# Patient Record
Sex: Female | Born: 1935 | Race: Black or African American | Hispanic: No | Smoking: Never smoker
Health system: Southern US, Community
[De-identification: ages and names within clinical notes are randomized; demographics above are authoritative.]

## PROBLEM LIST (undated history)

## (undated) DIAGNOSIS — I10 Essential (primary) hypertension: Secondary | ICD-10-CM

## (undated) DIAGNOSIS — I639 Cerebral infarction, unspecified: Secondary | ICD-10-CM

## (undated) DIAGNOSIS — R569 Unspecified convulsions: Secondary | ICD-10-CM

## (undated) DIAGNOSIS — F329 Major depressive disorder, single episode, unspecified: Secondary | ICD-10-CM

## (undated) DIAGNOSIS — F32A Depression, unspecified: Secondary | ICD-10-CM

## (undated) DIAGNOSIS — E785 Hyperlipidemia, unspecified: Secondary | ICD-10-CM

## (undated) DIAGNOSIS — E039 Hypothyroidism, unspecified: Secondary | ICD-10-CM

## (undated) DIAGNOSIS — F419 Anxiety disorder, unspecified: Secondary | ICD-10-CM

## (undated) DIAGNOSIS — K219 Gastro-esophageal reflux disease without esophagitis: Secondary | ICD-10-CM

## (undated) HISTORY — DX: Depression, unspecified: F32.A

## (undated) HISTORY — PX: NO PAST SURGERIES: SHX2092

## (undated) HISTORY — DX: Major depressive disorder, single episode, unspecified: F32.9

## (undated) HISTORY — DX: Gastro-esophageal reflux disease without esophagitis: K21.9

## (undated) HISTORY — DX: Unspecified convulsions: R56.9

## (undated) HISTORY — DX: Anxiety disorder, unspecified: F41.9

## (undated) HISTORY — DX: Hyperlipidemia, unspecified: E78.5

---

## 2010-05-13 DIAGNOSIS — I672 Cerebral atherosclerosis: Secondary | ICD-10-CM | POA: Insufficient documentation

## 2010-05-20 ENCOUNTER — Inpatient Hospital Stay (HOSPITAL_COMMUNITY)
Admission: EM | Admit: 2010-05-20 | Discharge: 2010-05-31 | Disposition: A | Discharge status: Home / Self Care | Payer: Medicare Other | Source: Home / Self Care | Attending: Internal Medicine | Admitting: Internal Medicine

## 2010-05-23 LAB — POCT I-STAT, CHEM 8
Chloride: 106 mEq/L (ref 96–112)
HCT: 44 % (ref 36.0–46.0)
Hemoglobin: 15 g/dL (ref 12.0–15.0)
Potassium: 3.2 mEq/L — ABNORMAL LOW (ref 3.5–5.1)

## 2010-05-23 LAB — CBC
Platelets: 130 10*3/uL — ABNORMAL LOW (ref 150–400)
RDW: 14.4 % (ref 11.5–15.5)
WBC: 7.6 10*3/uL (ref 4.0–10.5)

## 2010-05-23 LAB — LIPID PANEL
Cholesterol: 167 mg/dL (ref 0–200)
HDL: 60 mg/dL (ref >39)
LDL Cholesterol: 96 mg/dL (ref 0–99)
Total CHOL/HDL Ratio: 2.8 RATIO

## 2010-05-23 LAB — COMPREHENSIVE METABOLIC PANEL
ALT: 12 U/L (ref 0–35)
Albumin: 3.5 g/dL (ref 3.5–5.2)
CO2: 25 mEq/L (ref 19–32)
Calcium: 8.8 mg/dL (ref 8.4–10.5)
Chloride: 108 mEq/L (ref 96–112)
GFR calc Af Amer: >60 mL/min (ref >60)
Potassium: 3.6 mEq/L (ref 3.5–5.1)
Total Protein: 7.9 g/dL (ref 6.0–8.3)

## 2010-05-23 LAB — DIFFERENTIAL
Basophils Absolute: 0 10*3/uL (ref 0.0–0.1)
Basophils Relative: 0 % (ref 0–1)
Eosinophils Absolute: 0 10*3/uL (ref 0.0–0.7)
Eosinophils Relative: 0 % (ref 0–5)

## 2010-05-23 LAB — HEMOGLOBIN A1C
Hgb A1c MFr Bld: 5.8 % — ABNORMAL HIGH (ref <5.7)
Mean Plasma Glucose: 120 mg/dL — ABNORMAL HIGH (ref <117)

## 2010-05-23 LAB — POCT CARDIAC MARKERS
CKMB, poc: 12.8 ng/mL (ref 1.0–8.0)
Troponin i, poc: <0.05 ng/mL (ref 0.00–0.09)

## 2010-05-24 LAB — CARDIAC PANEL(CRET KIN+CKTOT+MB+TROPI)
CK, MB: 10.8 ng/mL (ref 0.3–4.0)
Total CK: 540 U/L — ABNORMAL HIGH (ref 7–177)
Total CK: 577 U/L — ABNORMAL HIGH (ref 7–177)
Troponin I: 0.04 ng/mL (ref 0.00–0.06)
Troponin I: 0.04 ng/mL (ref 0.00–0.06)

## 2010-05-24 LAB — LIPID PANEL
Total CHOL/HDL Ratio: 2.8 RATIO
VLDL: 11 mg/dL (ref 0–40)

## 2010-05-24 LAB — BASIC METABOLIC PANEL
BUN: 22 mg/dL (ref 6–23)
CO2: 26 mEq/L (ref 19–32)
Chloride: 102 mEq/L (ref 96–112)
Creatinine, Ser: 1.23 mg/dL — ABNORMAL HIGH (ref 0.4–1.2)
Glucose, Bld: 133 mg/dL — ABNORMAL HIGH (ref 70–99)

## 2010-05-24 LAB — GLUCOSE, CAPILLARY

## 2010-05-26 LAB — CBC
HCT: 37.3 % (ref 36.0–46.0)
Hemoglobin: 13.1 g/dL (ref 12.0–15.0)
MCH: 27.2 pg (ref 26.0–34.0)
MCHC: 35.1 g/dL (ref 30.0–36.0)

## 2010-05-26 LAB — BASIC METABOLIC PANEL
CO2: 26 mEq/L (ref 19–32)
Calcium: 8.8 mg/dL (ref 8.4–10.5)
Glucose, Bld: 119 mg/dL — ABNORMAL HIGH (ref 70–99)
Sodium: 141 mEq/L (ref 135–145)

## 2010-05-28 NOTE — Consult Note (Signed)
  NAMETYLIE, GOLONKA                ACCOUNT NO.:  [id number]  MEDICAL RECORD NO.:  [id number]          PATIENT TYPE:  INP  LOCATION:  3107                         FACILITY:  [facility]  PHYSICIAN:  Thana Farr, MD    DATE OF BIRTH:  05-20-1934  DATE OF CONSULTATION:  05/21/2010 DATE OF DISCHARGE:                                CONSULTATION   HISTORY:  Ms. Nelson is a 76 year old female who for approximately 4 days prior to admission had noted some difficulty with speech.  On the day of admission, her family noted that she was confused.  She was brought in for evaluation.  The patient was felt to have an expressive aphasia.  CT shows some small vessel ischemic changes, but no other abnormalities.  MRI of the brain was performed that showed multiple areas of ischemia in the left MCA distribution.  There was also a questionable area of subacute infarction on the right hemisphere as well.  Consult was called for further recommendations.  PAST MEDICAL HISTORY:  Hypertension.  The patient is noncompliant with meds for the past 2 years.  CURRENT MEDICATIONS: 1. Norvasc. 2. Ecotrin. 3. Hydrochlorothiazide. 4. Lovenox.  ALLERGIES:  No known drug allergies.  SOCIAL HISTORY:  The patient has no history of alcohol, tobacco or illicit drug abuse.  PHYSICAL EXAMINATION:  GENERAL:  Blood pressure 108/95, heart rate 83, respiratory rate 20, temperature 97.6, and O2 sat 96% on room air. NEUROLOGIC:  Mental status:  The patient is alert.  She has some difficulty following commands, specifically with more complicated and three-step commands.  She does some much better with one-step commands. Speech is nonfluent, and at times, she has nonsensical verbiage.  On cranial nerve testing II, discs flat bilaterally.  Visual fields grossly intact.  III, IV, and VI, extraocular movements intact.  Pupils reactive bilaterally.  V and VII, smile symmetric.  VIII grossly intact.  IX and X, positive gag.   XI, bilateral shoulder shrug.  XII, midline tongue extension.  On motor exam, the patient has a right upper extremity pronator drift, but otherwise gives 5/5 strength.  On sensory test, pinprick and light touch were intact bilaterally.  Deep tendon reflexes are 3+ throughout, absent ankle jerks.  Plantars are mute bilaterally. On cerebellar testing, finger-to-nose and heel-to-shin intact.  LABORATORY TESTS:  Sodium is 144, potassium 3.6, chloride 108, CO2 of 25, BUN and creatinine 7 and 0.92 respectively.  Glucose was 137. Hemoglobin and hematocrit of 15 and 44.0 respectively.  PT, INR, and PTT of 15, 1.16, and 32 respectively.  ASSESSMENT:  A 76 year old female with risk factor of hypertension and noncompliant with medications, who presents with left MCA distribution acute infarcts.  PLAN: 1. Echocardiogram today. 2. ASA appropriate for long-term prophylaxis unless an embolic source     found on cardiac testing. 3. Compliance stressed. 4. Speech therapy.          ______________________________ Thana Farr, MD     LR/MEDQ  D:  05/21/2010  T:  05/21/2010  Job:  [id number]  Electronically Signed by Thana Farr MD on 05/28/2010 10:14:08 AM

## 2010-05-29 LAB — CBC
Hemoglobin: 13.5 g/dL (ref 12.0–15.0)
MCH: 27.2 pg (ref 26.0–34.0)
MCHC: 34.9 g/dL (ref 30.0–36.0)

## 2010-05-29 LAB — BASIC METABOLIC PANEL
BUN: 50 mg/dL — ABNORMAL HIGH (ref 6–23)
CO2: 23 mEq/L (ref 19–32)
Calcium: 8.9 mg/dL (ref 8.4–10.5)
Creatinine, Ser: 1.64 mg/dL — ABNORMAL HIGH (ref 0.4–1.2)
GFR calc Af Amer: 37 mL/min — ABNORMAL LOW (ref >60)
Glucose, Bld: 115 mg/dL — ABNORMAL HIGH (ref 70–99)

## 2010-05-30 LAB — BASIC METABOLIC PANEL
BUN: 52 mg/dL — ABNORMAL HIGH (ref 6–23)
Calcium: 8.9 mg/dL (ref 8.4–10.5)
Creatinine, Ser: 1.62 mg/dL — ABNORMAL HIGH (ref 0.4–1.2)
GFR calc non Af Amer: 31 mL/min — ABNORMAL LOW (ref >60)
Glucose, Bld: 113 mg/dL — ABNORMAL HIGH (ref 70–99)

## 2010-05-31 LAB — BASIC METABOLIC PANEL
BUN: 34 mg/dL — ABNORMAL HIGH (ref 6–23)
Chloride: 112 mEq/L (ref 96–112)
Glucose, Bld: 107 mg/dL — ABNORMAL HIGH (ref 70–99)
Potassium: 4.2 mEq/L (ref 3.5–5.1)

## 2010-05-31 LAB — CBC
HCT: 34.3 % — ABNORMAL LOW (ref 36.0–46.0)
MCV: 78.5 fL (ref 78.0–100.0)
RBC: 4.37 MIL/uL (ref 3.87–5.11)
RDW: 14.2 % (ref 11.5–15.5)
WBC: 5.1 10*3/uL (ref 4.0–10.5)

## 2010-06-03 NOTE — Discharge Summary (Signed)
  Carla Manning, Carla Manning                ACCOUNT NO.:  [id number]  MEDICAL RECORD NO.:  [id number]          PATIENT TYPE:  INP  LOCATION:  3038                         FACILITY:  [facility]  PHYSICIAN:  Mariea Stable, MD   DATE OF BIRTH:  1935-02-28  DATE OF ADMISSION:  05/20/2010 DATE OF DISCHARGE:  05/25/2010                              DISCHARGE SUMMARY   DISCHARGE DIAGNOSES: 1. Stroke. 2. Hypertension. 3. Acute renal insufficiency. Resolved.   DISCHARGE MEDICATIONS: 1. Amlodipine 10 mg p.o. daily. 2. Aspirin 325 mg p.o. daily. 3. Coreg 6.25 mg p.o. b.i.d. 4. Colace 100 mg one cap p.o. twice a b.i.d. 5. Zantac 1 tablet by mouth daily p.r.n. for acid reflux.  DISPOSITION AND FOLLOWUP:  The patient will be discharged to a skilled nursing facility.  She will follow up with Dr. Pearlean Brownie at [facility] on July 11, 2010 at 1:30 p.m. phone number [phone] for hospital a followup.  Furthermore, the patient needs followup with a cardiologist.  Appointment to be determined.  The patient experienced acute renal insufficiency prior to discharge.  Hydrochlorothiazide and lisinopril were discontinued.  She received IV fluids and acute renal insufficiency resolved.  Please repeat a BMET.  Furthermore follow up on the patient's symptoms to note for any improvement neurologically, any improvement in speech or any new onset deficit.  Please also follow up on the patient's blood pressures.  The patient need PT and OT during this stay at the skilled nursing facility.  HOSPITAL COURSE: 1. Stroke.  The patient's symptoms gradually are improving especially     her speech although it is slurred with aphasia. 2. Hypertension.  Blood pressures were well controlled on amlodipine     and Coreg.  Blood pressure should be followed up as an outpatient. 3. Acute renal insufficiency due to lisinopril and     hydrochlorothiazide, resolved after discontinuing these medications     and IV fluids.  On  day of discharge patient's creatinine was at baseline.  DISCHARGE VITALS:  Temperature 97.4, pulse 69, respiratory rate 20, blood pressure 133/80, saturation 97% on room air.  LABORATORY DATA:  CBC, WBC 5.7, hemoglobin 11.8, hematocrit 34.3, platelets 141.  BMET, sodium 141, potassium 4.2, chloride 112, CO2 of 23, glucose 107, BUN 34, creatinine 1.14, calcium 8.5.    ______________________________ Almyra Deforest, MD   ______________________________ Mariea Stable, MD    JI/MEDQ  D:  05/31/2010  T:  05/31/2010  Job:  [id number]  cc:   Pramod P. Pearlean Brownie, MD  Electronically Signed by Almyra Deforest MD on 06/02/2010 14:78:29 PM Electronically Signed by Mariea Stable MD on 06/03/2010 08:25:09 AM

## 2010-06-22 NOTE — Discharge Summary (Signed)
NAMEJASHANTI, CLINKSCALE                ACCOUNT NO.:  [id number]  MEDICAL RECORD NO.:  [id number]          PATIENT TYPE:  INP  LOCATION:  3038                         FACILITY:  [facility]  PHYSICIAN:  Mariea Stable, MD   DATE OF BIRTH:  1935-04-09  DATE OF ADMISSION:  05/20/2010 DATE OF DISCHARGE:  05/26/2010                              DISCHARGE SUMMARY   DISCHARGE DIAGNOSES: 1. Stroke. 2. Hypertension.  DISCHARGE MEDICATIONS: 1. Amlodipine 10 mg p.o. daily. 2. Aspirin 325 mg p.o. daily. 3. Hydrochlorothiazide 25 mg p.o. daily. 4. Lisinopril 20 mg p.o. daily.  DISPOSITION AND FOLLOWUP:  The patient will follow up with both Neurology and Cardiology.  Dates of appointments to be determined. Please follow up on the patient's symptoms to note for any improvement neurologically, any improvement in speech, or any new onset deficit. Please also follow up the patient's blood pressure.  PROCEDURES PERFORMED:  EKG, head CT, brain MRI with contrast, head MRA, PTE, TEE.  CONSULTATIONS:  Neurology and Cardiology.  HISTORY AND PHYSICAL EXAM:  The patient is a 76 year old female with past medical history significant for poorly controlled hypertension and hypercholesterolemia per patient.  The patient states that 5 days prior to admission, she was noted to have slurred speech and difficulty finding her words.  She denied any other focal neurological deficits at that time.  She also denied any headache, changes in vision, or anything similar to this happening in the past.  She was prompted to come into the ED by her significant other who noted all of these changes in her. The patient also denied any changes in strength, weakness, or any numbness, tingling, nausea, or vomiting.  Of note, the patient was diagnosed with hypertension some years prior and decided to take herself off of her medication because she did not feel that she needed them.  PHYSICAL EXAMINATION:  VITAL SIGNS:  Temperature  98.5, blood pressure 269/159, pulse 74, respiratory rate 18, satting 97% on room air. GENERAL:  The patient was in no acute distress.  The patient is having difficulty finding words and her speech was slurred. EYES:  Pupils were equal reactive to light and accommodation. Extraocular movements were intact. ENT:  Moist mucus.  Pharynx, no exudate. NECK:  Supple with no lymphadenopathy. RESPIRATORY:  Clear to auscultation.  No crackles. CARDIOVASCULAR:  Regular rate and rhythm, systolic ejection murmur. GI:  Positive bowel sounds. ABDOMEN:  Soft, nontender, nondistended. EXTREMITIES:  No edema. SKIN:  Normal turgor. NEURO:  The patient was alert and oriented x4.  Extraocular movements were intact.  Cranial nerves II through XI were intact.  Cranial nerve XII shows mild deviation to the right.  Sensation was intact.  Strength was 5/5, both upper and lower extremities.  Finger-to-nose shows some past-pointing on the right side.  Slurred speech. PSYCH:  The patient was appropriate to not appear anxious or depressed.  LABORATORY DATA:  Metabolic panel, sodium 142, potassium 3.2, chloride 106, bicarb 28, BUN 14, creatinine 1, glucose 126.  CBC, WBC 7.6, hemoglobin 14.1, hematocrit 39.6, platelets 130, MCV 78.  CK-MB 12.8, myoglobin 362, troponins less than 0.05.  Chest x-ray  shows mild cardiomegaly, no acute cardiopulmonary disease.  CT with contrast at this time showed no acute intracranial abnormalities with mild age- appropriate cortical atrophy and some ischemic changes in white matter. EKG within normal limits.  HOSPITAL COURSE BY PROBLEMS: 1. Stroke.  The patient  was admitted to Neurosurgery ICU and her     blood pressures were monitored.  Her mean arterial pressures were     brought down by 25% after placing the patient on hydralazine drip.     The patient's symptoms were fine at that time that she left     discharge, she was at her new baseline.  The patient was moved down     to  floor.  Several days later, she developed right-sided     hemiparesis and her speech remained slurred with aphasia.  MRI was     done during admission which showed several infarcts suspicious of     an embolic cause her stroke.  PTE and TEE were done, both found to     be within normal limits that showed no source for emboli.     Cholesterol panel was also checked while the patient was inhouse     and showed a total cholesterol of 153.  The patient was put on     hydrochlorothiazide, lisinopril, and Norvasc for blood pressure     control and labetalol for breakthrough hypertension.  The patient's     blood pressures were well controlled while in house.  No cause for     emboli was detected during admission. 2. Hypertension.  As stated prior, the patient's hypertension was well     managed on 3 agents and the patient will go home on chronic     antihypertensive medications and we will follow up with Cardiology     as well as outpatient physician.  DISCHARGE LABORATORY DATA AND VITAL SIGNS:  Temperature 97.6, pulse 72, respirations 16, blood pressure 133/78, satting 92% on room air.  No new labs on date of discharge.     Clerance Lav, MD PhD   ______________________________ Mariea Stable, MD    RS/MEDQ  D:  05/25/2010  T:  05/26/2010  Job:  [id number]  Electronically Signed by Clerance Lav MD PHD on 06/20/2010 01:25:09 PM Electronically Signed by Mariea Stable MD on 06/22/2010 03:57:59 PM

## 2010-07-07 ENCOUNTER — Ambulatory Visit: Payer: Commercial Indemnity | Admitting: Internal Medicine

## 2011-05-30 ENCOUNTER — Ambulatory Visit (INDEPENDENT_AMBULATORY_CARE_PROVIDER_SITE_OTHER): Payer: Medicare Other | Admitting: Family Medicine

## 2011-05-30 ENCOUNTER — Encounter: Payer: Self-pay | Admitting: Family Medicine

## 2011-05-30 DIAGNOSIS — K219 Gastro-esophageal reflux disease without esophagitis: Secondary | ICD-10-CM | POA: Diagnosis not present

## 2011-05-30 DIAGNOSIS — I672 Cerebral atherosclerosis: Secondary | ICD-10-CM | POA: Diagnosis not present

## 2011-05-30 DIAGNOSIS — I10 Essential (primary) hypertension: Secondary | ICD-10-CM

## 2011-05-30 DIAGNOSIS — M79642 Pain in left hand: Secondary | ICD-10-CM

## 2011-05-30 DIAGNOSIS — Z8673 Personal history of transient ischemic attack (TIA), and cerebral infarction without residual deficits: Secondary | ICD-10-CM

## 2011-05-30 DIAGNOSIS — M79609 Pain in unspecified limb: Secondary | ICD-10-CM

## 2011-05-30 MED ORDER — AMLODIPINE BESYLATE 10 MG PO TABS: 10.0000 mg | ORAL_TABLET | Freq: Every day | ORAL | Status: DC

## 2011-05-30 MED ORDER — DOCUSATE SODIUM 100 MG PO CAPS: 100.0000 mg | ORAL_CAPSULE | Freq: Two times a day (BID) | ORAL | Status: DC | PRN

## 2011-05-30 MED ORDER — RANITIDINE HCL 150 MG PO CAPS: 150.0000 mg | ORAL_CAPSULE | Freq: Every day | ORAL | Status: DC

## 2011-05-30 MED ORDER — CARVEDILOL 6.25 MG PO TABS: 6.2500 mg | ORAL_TABLET | Freq: Two times a day (BID) | ORAL | Status: DC

## 2011-05-30 NOTE — Patient Instructions (Signed)
Place wrist/hand pain patient instructions here.   Use muscle rub on left hand after warm compresses each night. Try to limit overuse.  Refills have been routed to Pharmacy.   Try to limit foods that can contribute to excess gas !!!

## 2011-05-30 NOTE — Progress Notes (Signed)
  Subjective:    Patient ID: Carla Manning, female    DOB: 1935/02/06, 77 y.o.   MRN: [id number]  HPI Pleasant elderly pt c/o left hand pain for more than 1 year but worse after CVA where she uses her left hand to ambulate with cane and to roll her wheelchair. She has a hx of CTS and thee symptoms have increased in the last 2-weeks.  She c/o lower abd pain like gas and has a hx of mild constipation. She is not taking stools softener and her diet and appetite are good. She refuses to take MiraLax.    Review of Systems  Constitutional: Negative.        Seated in wheelchair  Gastrointestinal: Positive for constipation. Negative for nausea, vomiting, abdominal pain, diarrhea, blood in stool and abdominal distention.  Genitourinary: Negative.   Musculoskeletal: Positive for arthralgias.       Left hand discomfort with mild muscle cramping; no med needed  Neurological:       At baseline s/p CVA with right hemiparesis       Objective:   Physical Exam  In NAD; she is well- nourished and well-developed, seated in wheelchair. Left hand with good ROM, nontender with palpation and no significant joint deformities. Grip is fair. No warmth or swelling noted.  Abdomen is not distended, is soft and nontender.        Assessment & Plan:   1. GERD (gastroesophageal reflux disease)   2. HTN (hypertension)   3. Cerebrovascular disease, arteriosclerotic, post-stroke   4. Left hand pain    GERD medication (Ranitidine) is refilled. Cont. Current diet. Try to increase activities around the home. HTN- stable. Cont. Current meds. CVA -( residual) pt is at baseline and has no new deficits. Hand pain- try topical analgesic and limit use of hand as much as possible.

## 2011-11-29 ENCOUNTER — Other Ambulatory Visit: Payer: Self-pay | Admitting: Family Medicine

## 2011-12-07 ENCOUNTER — Telehealth: Payer: Self-pay

## 2011-12-07 ENCOUNTER — Other Ambulatory Visit: Payer: Self-pay | Admitting: Family Medicine

## 2011-12-07 NOTE — Telephone Encounter (Signed)
[facility] STATES PT WAS GIVEN COREG AND IT SHOULD BE THE 90 DAY SUPPLY INSTEAD OF THE QUANTITY. PLEASE CALL [phone] AND THE PT IS OUT OF HER MEDS NOW   [facility] [address] AT [phone]

## 2011-12-07 NOTE — Telephone Encounter (Signed)
This was sent in 05/30/2011 for 1 year.  Please take care of this

## 2011-12-08 NOTE — Telephone Encounter (Signed)
Spoke w/pharmacy and they report that previous to 05/30/11 Rx, pt had been taking Coreg 6.25 BID. On 05/30/11 she got the #90 from new Rx and has been RFing it Q 1 1/2 mos in order to cont taking BID. Corrected Rx to #180 w/1 RF to complete Dr McPherson's RFs for 1 year.

## 2012-03-25 ENCOUNTER — Emergency Department (HOSPITAL_COMMUNITY): Payer: Medicare Other

## 2012-03-25 ENCOUNTER — Emergency Department (HOSPITAL_COMMUNITY)
Admission: EM | Admit: 2012-03-25 | Discharge: 2012-03-25 | Disposition: A | Discharge status: Home / Self Care | Payer: Medicare Other | Attending: Emergency Medicine | Admitting: Emergency Medicine

## 2012-03-25 ENCOUNTER — Encounter (HOSPITAL_COMMUNITY): Payer: Self-pay | Admitting: *Deleted

## 2012-03-25 DIAGNOSIS — I10 Essential (primary) hypertension: Secondary | ICD-10-CM | POA: Diagnosis not present

## 2012-03-25 DIAGNOSIS — Z8673 Personal history of transient ischemic attack (TIA), and cerebral infarction without residual deficits: Secondary | ICD-10-CM | POA: Insufficient documentation

## 2012-03-25 DIAGNOSIS — Z79899 Other long term (current) drug therapy: Secondary | ICD-10-CM | POA: Diagnosis not present

## 2012-03-25 DIAGNOSIS — Z7982 Long term (current) use of aspirin: Secondary | ICD-10-CM | POA: Insufficient documentation

## 2012-03-25 DIAGNOSIS — S92309A Fracture of unspecified metatarsal bone(s), unspecified foot, initial encounter for closed fracture: Secondary | ICD-10-CM | POA: Insufficient documentation

## 2012-03-25 DIAGNOSIS — X58XXXA Exposure to other specified factors, initial encounter: Secondary | ICD-10-CM | POA: Insufficient documentation

## 2012-03-25 DIAGNOSIS — Y929 Unspecified place or not applicable: Secondary | ICD-10-CM | POA: Insufficient documentation

## 2012-03-25 DIAGNOSIS — Y939 Activity, unspecified: Secondary | ICD-10-CM | POA: Insufficient documentation

## 2012-03-25 DIAGNOSIS — S92301A Fracture of unspecified metatarsal bone(s), right foot, initial encounter for closed fracture: Secondary | ICD-10-CM

## 2012-03-25 HISTORY — DX: Essential (primary) hypertension: I10

## 2012-03-25 HISTORY — DX: Cerebral infarction, unspecified: I63.9

## 2012-03-25 MED ORDER — HYDROCODONE-ACETAMINOPHEN 5-325 MG PO TABS: 1.0000 | ORAL_TABLET | ORAL | Status: DC | PRN

## 2012-03-25 NOTE — ED Notes (Signed)
Pt states she fell in the bathroom this past Wednesday, since then has had R great toe pain, states it hurts and tingling on bottom of foot under great toe.

## 2012-03-25 NOTE — ED Notes (Signed)
Discharge instructions given, verbalized understanding of follow up care. Wheeled out of department with family. No acute distress.

## 2012-03-25 NOTE — ED Provider Notes (Signed)
Medical screening examination/treatment/procedure(s) were conducted as a shared visit with non-physician practitioner(s) and myself.  I personally evaluated the patient during the encounter  Carla Manning is a 77 y.o. female hx of stroke, HTN here with s/p fall. She is wheelchair bound and she accidentally hit R foot 5 days when she was in the bathroom. She has minimal tenderness over 1st MTP joint. Xray showed nondisplaced fracture at 1st MTP joint. She was given a hard sole shoe. She doesn't ambulate at baseline. She will f/u with ortho.     Richardean Canal, MD 03/25/12 [phone]

## 2012-03-25 NOTE — ED Provider Notes (Signed)
History     CSN: [id number]  Arrival date & time 03/25/12  1604   First MD Initiated Contact with Patient 03/25/12 1622      Chief Complaint  Patient presents with  . Toe Pain    R great toe    (Consider location/radiation/quality/duration/timing/severity/associated sxs/prior treatment) Patient is a 77 y.o. female presenting with toe pain. The history is provided by the patient.  Toe Pain This is a new problem. The current episode started in the past 7 days. The problem occurs constantly. Pertinent negatives include no abdominal pain, chest pain, chills, fever, headaches or neck pain. Associated symptoms comments: She states that 5 days ago she got up to the bathroom during the night and hit her right foot against something causing great toe pain. She fell at that time as well but there was no LOC and she has no other complaint that toe pain. No CP, SOB, N, V or other pain..    Past Medical History  Diagnosis Date  . Stroke     affected R side  . Hypertension     History reviewed. No pertinent past surgical history.  History reviewed. No pertinent family history.  History  Substance Use Topics  . Smoking status: Never Smoker   . Smokeless tobacco: Never Used  . Alcohol Use: No    OB History    Grav Para Term Preterm Abortions TAB SAB Ect Mult Living                  Review of Systems  Constitutional: Negative for fever and chills.  HENT: Negative for neck pain.   Cardiovascular: Negative for chest pain.  Gastrointestinal: Negative for abdominal pain.  Musculoskeletal:       See HPI.  Skin: Negative.  Negative for wound.  Neurological: Negative.  Negative for headaches.    Allergies  Review of patient's allergies indicates no known allergies.  Home Medications   Current Outpatient Rx  Name  Route  Sig  Dispense  Refill  . AMLODIPINE BESYLATE 10 MG PO TABS   Oral   Take 1 tablet (10 mg total) by mouth daily.   90 tablet   3   . ASPIRIN 325 MG PO  TABS   Oral   Take 325 mg by mouth daily.         Marland Kitchen CARVEDILOL 6.25 MG PO TABS   Oral   Take 6.25 mg by mouth 2 (two) times daily with a meal.         . DOCUSATE SODIUM 100 MG PO CAPS   Oral   Take 100 mg by mouth 2 (two) times daily as needed. Constipation         . RANITIDINE HCL 150 MG PO CAPS   Oral   Take 1 capsule (150 mg total) by mouth daily.   90 capsule   3     BP 171/63  Pulse 70  Temp 99.1 F (37.3 C) (Oral)  Resp 18  SpO2 100%  Physical Exam  Constitutional: She is oriented to person, place, and time. She appears well-developed and well-nourished.  Neck: Normal range of motion.  Pulmonary/Chest: Effort normal.  Musculoskeletal: Normal range of motion.       Right LE swollen (baseline per patient). Tender to palpation of great toe without bony deformity. Nail intact. No discoloration. Plantar surface unremarkable in appearance.  Neurological: She is alert and oriented to person, place, and time.  Skin: Skin is warm and dry.  Psychiatric: She has a normal mood and affect.    ED Course  Procedures (including critical care time)  Labs Reviewed - No data to display No results found. Dg Foot Complete Right  03/25/2012  *RADIOLOGY REPORT*  Clinical Data: Larey Seat 5 days ago.  RIGHT FOOT COMPLETE - 3+ VIEW  Comparison: None.  Findings: Essentially nondisplaced fracture through the lateral corner of the base of the first proximal phalanx.  There are two linear lucencies in the adjacent portion of the first metatarsal head.  Also noted is diffuse dorsal soft tissue swelling.  Large calcaneal spurs are also noted.  IMPRESSION: Essentially nondisplaced fractures on both sides of the first MTP joint with associated soft tissue swelling and articular involvement.   Original Report Authenticated By: Beckie Salts, M.D.     No diagnosis found.  1. Right mtp fracture   MDM  Nondisplaced fractures at 1st MTP joint. No external lesion or gross deformity. Patient with  previous history of CVA with residual right sided deficits usually in a wheel chair except for transferring to bed or to toilet. She has family assistance available. Post-op shoe and pain medication given. Orthopedic follow up discussed.        Rodena Medin, PA-C 03/25/12 Rickey Primus

## 2012-04-15 DIAGNOSIS — S92919A Unspecified fracture of unspecified toe(s), initial encounter for closed fracture: Secondary | ICD-10-CM | POA: Diagnosis not present

## 2012-05-28 ENCOUNTER — Emergency Department (HOSPITAL_COMMUNITY)
Admission: EM | Admit: 2012-05-28 | Discharge: 2012-05-28 | Disposition: A | Discharge status: Home / Self Care | Payer: Medicare Other | Attending: Emergency Medicine | Admitting: Emergency Medicine

## 2012-05-28 ENCOUNTER — Encounter (HOSPITAL_COMMUNITY): Payer: Self-pay

## 2012-05-28 ENCOUNTER — Emergency Department (HOSPITAL_COMMUNITY): Payer: Medicare Other

## 2012-05-28 DIAGNOSIS — Z8673 Personal history of transient ischemic attack (TIA), and cerebral infarction without residual deficits: Secondary | ICD-10-CM | POA: Insufficient documentation

## 2012-05-28 DIAGNOSIS — R0602 Shortness of breath: Secondary | ICD-10-CM | POA: Diagnosis not present

## 2012-05-28 DIAGNOSIS — A599 Trichomoniasis, unspecified: Secondary | ICD-10-CM | POA: Insufficient documentation

## 2012-05-28 DIAGNOSIS — Z7982 Long term (current) use of aspirin: Secondary | ICD-10-CM | POA: Diagnosis not present

## 2012-05-28 DIAGNOSIS — R232 Flushing: Secondary | ICD-10-CM

## 2012-05-28 DIAGNOSIS — I10 Essential (primary) hypertension: Secondary | ICD-10-CM | POA: Diagnosis not present

## 2012-05-28 DIAGNOSIS — Z79899 Other long term (current) drug therapy: Secondary | ICD-10-CM | POA: Insufficient documentation

## 2012-05-28 DIAGNOSIS — R6889 Other general symptoms and signs: Secondary | ICD-10-CM | POA: Diagnosis not present

## 2012-05-28 LAB — CBC WITH DIFFERENTIAL/PLATELET
Basophils Relative: 0 % (ref 0–1)
Eosinophils Absolute: 0.1 10*3/uL (ref 0.0–0.7)
HCT: 37.9 % (ref 36.0–46.0)
Hemoglobin: 13.8 g/dL (ref 12.0–15.0)
MCH: 28 pg (ref 26.0–34.0)
MCHC: 36.4 g/dL — ABNORMAL HIGH (ref 30.0–36.0)
Monocytes Absolute: 0.4 10*3/uL (ref 0.1–1.0)
Monocytes Relative: 6 % (ref 3–12)

## 2012-05-28 LAB — URINALYSIS, ROUTINE W REFLEX MICROSCOPIC
Bilirubin Urine: NEGATIVE
Glucose, UA: NEGATIVE mg/dL
Hgb urine dipstick: NEGATIVE
Ketones, ur: NEGATIVE mg/dL
pH: 6.5 (ref 5.0–8.0)

## 2012-05-28 LAB — BASIC METABOLIC PANEL
BUN: 16 mg/dL (ref 6–23)
Chloride: 101 mEq/L (ref 96–112)
Creatinine, Ser: 1.04 mg/dL (ref 0.50–1.10)
GFR calc Af Amer: 59 mL/min — ABNORMAL LOW (ref >90)
GFR calc non Af Amer: 51 mL/min — ABNORMAL LOW (ref >90)

## 2012-05-28 LAB — URINE MICROSCOPIC-ADD ON

## 2012-05-28 MED ORDER — ONDANSETRON HCL 4 MG/2ML IJ SOLN
4.0000 mg | Freq: Once | INTRAMUSCULAR | Status: AC
  Administered 2012-05-28: 4 mg via INTRAVENOUS
  Filled 2012-05-28: qty 2

## 2012-05-28 MED ORDER — METRONIDAZOLE 500 MG PO TABS
2000.0000 mg | ORAL_TABLET | Freq: Once | ORAL | Status: AC
  Administered 2012-05-28: 2000 mg via ORAL
  Filled 2012-05-28: qty 4

## 2012-05-28 NOTE — ED Notes (Signed)
Pt. C/o hot flashes x2 days. Per daughter, when she goes to bed at night she gets hot and feels SOB for 2-3 minutes. Denies CP, N/V, HA. Hx of HTN, states takes HTN medication regularly. Pt. Denies any complaints at this time. Daughter at bedside.

## 2012-05-28 NOTE — ED Notes (Signed)
Per EMS, pt from home with c/o 2 days of hot flashes. Hx of HTN, CVA with right sided weakness and aphasia. Pt. Alert only to situation which is baseline.

## 2012-05-28 NOTE — ED Provider Notes (Signed)
History     CSN: [id number]  Arrival date & time 05/28/12  0155   First MD Initiated Contact with Patient 05/28/12 0207      Chief Complaint  Patient presents with  . Hot Flashes    (Consider location/radiation/quality/duration/timing/severity/associated sxs/prior treatment) HPI 78 year old female presents emergency apartment with complaint of hot flashes and shortness of breath. Patient has woken around 3 or 4:00 AM for the last 2 mornings with the feeling that she is hot and short of breath. Patient with history of stroke with right-sided deficit and mild aphasia. Family reports patient often feels hot and throws off her covers, but usually does not wake course the family complaining of being hot and had been short of breath. Symptoms last for a few seconds and then resolve. She is asymptomatic at this time. Patient noted to have hypertension, has history of same. She's had unchanged blood pressure medications for some time. She is taking her medications. She denies any chest pain, no shortness of breath at the moment. No abdominal pain no fever no chills no cough. Patient with right-sided deficits, but no new ones.Family is concerned given the fact that she's been waking people up the last 2 nights and wanted her to get "checked out"  Past Medical History  Diagnosis Date  . Stroke     affected R side  . Hypertension     History reviewed. No pertinent past surgical history.  No family history on file.  History  Substance Use Topics  . Smoking status: Never Smoker   . Smokeless tobacco: Never Used  . Alcohol Use: No    OB History    Grav Para Term Preterm Abortions TAB SAB Ect Mult Living                  Review of Systems  See History of Present Illness; otherwise all other systems are reviewed and negative Allergies  Review of patient's allergies indicates no known allergies.  Home Medications   Current Outpatient Rx  Name  Route  Sig  Dispense  Refill  .  AMLODIPINE BESYLATE 10 MG PO TABS   Oral   Take 1 tablet (10 mg total) by mouth daily.   90 tablet   3   . ASPIRIN 325 MG PO TABS   Oral   Take 325 mg by mouth daily.         Marland Kitchen CARVEDILOL 6.25 MG PO TABS   Oral   Take 6.25 mg by mouth 2 (two) times daily with a meal.         . RANITIDINE HCL 150 MG PO CAPS   Oral   Take 1 capsule (150 mg total) by mouth daily.   90 capsule   3     BP 175/73  Pulse 71  Temp 98.8 F (37.1 C) (Oral)  Resp 20  SpO2 98%  Physical Exam  Nursing note and vitals reviewed. Constitutional: She is oriented to person, place, and time. She appears well-developed and well-nourished. No distress.  HENT:  Head: Normocephalic and atraumatic.  Nose: Nose normal.  Mouth/Throat: Oropharynx is clear and moist.  Eyes: Conjunctivae normal and EOM are normal. Pupils are equal, round, and reactive to light.  Neck: Normal range of motion. Neck supple. No JVD present. No tracheal deviation present. No thyromegaly present.  Cardiovascular: Normal rate, regular rhythm, normal heart sounds and intact distal pulses.  Exam reveals no gallop and no friction rub.   No murmur heard. Pulmonary/Chest: Effort  normal and breath sounds normal. No stridor. No respiratory distress. She has no wheezes. She has no rales. She exhibits no tenderness.  Abdominal: Soft. Bowel sounds are normal. She exhibits no distension and no mass. There is no tenderness. There is no rebound and no guarding.  Musculoskeletal: Normal range of motion. She exhibits no edema and no tenderness.  Lymphadenopathy:    She has no cervical adenopathy.  Neurological: She is alert and oriented to person, place, and time. She has normal reflexes. No cranial nerve deficit. She exhibits normal muscle tone. Coordination (right-sided weakness, slurred speech. Per patient and family at baseline) abnormal.  Skin: Skin is warm and dry. No rash noted. No erythema. No pallor.  Psychiatric: She has a normal mood and  affect. Her behavior is normal. Judgment and thought content normal.    ED Course  Procedures (including critical care time)  Labs Reviewed  URINALYSIS, ROUTINE W REFLEX MICROSCOPIC - Abnormal; Notable for the following:    APPearance CLOUDY (*)     Leukocytes, UA MODERATE (*)     All other components within normal limits  CBC WITH DIFFERENTIAL - Abnormal; Notable for the following:    MCV 77.0 (*)     MCHC 36.4 (*)     Platelets 131 (*)     All other components within normal limits  BASIC METABOLIC PANEL - Abnormal; Notable for the following:    Glucose, Bld 116 (*)     GFR calc non Af Amer 51 (*)     GFR calc Af Amer 59 (*)     All other components within normal limits  URINE MICROSCOPIC-ADD ON - Abnormal; Notable for the following:    Squamous Epithelial / LPF MANY (*)     Bacteria, UA MANY (*)     All other components within normal limits  POCT I-STAT TROPONIN I  URINE CULTURE   Dg Chest Port 1 View  05/28/2012  *RADIOLOGY REPORT*  Clinical Data: Shortness of breath  PORTABLE CHEST - 1 VIEW  Comparison: 05/23/2010  Findings: Mild aortic unfolding without interval change.  Heart size upper normal.  Hypoaeration results in hemidiaphragm elevation, partially obscuring the lung bases.  Allowing for this, no confluent airspace opacity, pleural effusion, or pneumothorax. Multilevel degenerative changes without acute osseous finding. Left shoulder degenerative change.  IMPRESSION: Hypoaeration without acute process identified   Original Report Authenticated By: Jearld Lesch, M.D.      Date: 05/28/2012  Rate: 82  Rhythm: normal sinus rhythm  QRS Axis: normal  Intervals: normal  ST/T Wave abnormalities: normal  Conduction Disutrbances:none  Narrative Interpretation:   Old EKG Reviewed: none available   1. Hot flashes not due to menopause   2. HTN (hypertension)   3. Trichomonal infection       MDM  78 year old female with hot flashes. Workup thus far unremarkable and  she is back at her baseline. Her urine does show trichomonas. She reports her last sexual activity was 2 years ago. She and family deny any sexual abuse. Patient denies any vaginal discharge or urinary symptoms. Will treat for trichomonas with 2 g of Flagyl, and have her followup with her primary care Dr. Patient has been hypertensive here, but shows no end organ damage signs.        Olivia Mackie, MD 05/28/12 [phone]

## 2012-05-29 LAB — URINE CULTURE

## 2012-05-30 ENCOUNTER — Telehealth (HOSPITAL_COMMUNITY): Payer: Self-pay | Admitting: Emergency Medicine

## 2012-05-30 NOTE — ED Notes (Signed)
Results received from [facility]. (+) URNC -> >/= 100,000 colonies, Proteus Mirabilis.  No Abx Rx given in ED.  Chart to MD office for review.

## 2012-05-31 NOTE — ED Notes (Signed)
Chart returned from EDP office .with rx written By Lemont Fillers for Kelfex 500 mg tab # 14 one tab po BID x 7 days needs to be called to pharmacy.

## 2012-06-01 ENCOUNTER — Telehealth (HOSPITAL_COMMUNITY): Payer: Self-pay | Admitting: Emergency Medicine

## 2012-06-02 ENCOUNTER — Telehealth (HOSPITAL_COMMUNITY): Payer: Self-pay | Admitting: Emergency Medicine

## 2012-06-06 NOTE — ED Notes (Signed)
Unable to contact via phone ,letter sent to EPIC address. 

## 2012-06-11 ENCOUNTER — Other Ambulatory Visit: Payer: Self-pay | Admitting: *Deleted

## 2012-06-11 MED ORDER — AMLODIPINE BESYLATE 10 MG PO TABS: 10.0000 mg | ORAL_TABLET | Freq: Every day | ORAL | Status: DC

## 2012-06-11 MED ORDER — CARVEDILOL 6.25 MG PO TABS: 6.2500 mg | ORAL_TABLET | Freq: Two times a day (BID) | ORAL | Status: DC

## 2012-06-27 ENCOUNTER — Telehealth: Payer: Self-pay | Admitting: Radiology

## 2012-06-27 NOTE — Telephone Encounter (Signed)
Please advise if we can send in the Carvedilol Rx to last until patient can return to clinic. Wants 1 mo supply on this medication.

## 2012-06-28 MED ORDER — CARVEDILOL 6.25 MG PO TABS: 6.2500 mg | ORAL_TABLET | Freq: Two times a day (BID) | ORAL | Status: DC

## 2012-06-28 NOTE — Telephone Encounter (Signed)
Rx sent for 1 month.  Meds ordered this encounter  Medications  . carvedilol (COREG) 6.25 MG tablet    Sig: Take 1 tablet (6.25 mg total) by mouth 2 (two) times daily with a meal. NEEDS OFFICE VISIT (second notice)    Dispense:  60 tablet    Refill:  0    PLEASE USE SURESCRIPTS INSTEAD FOR QUICKER REPONSE    Order Specific Question:  Supervising Provider    Answer:  DOOLITTLE, ROBERT P [[id number]]

## 2012-07-17 ENCOUNTER — Other Ambulatory Visit: Payer: Self-pay

## 2012-07-17 MED ORDER — AMLODIPINE BESYLATE 10 MG PO TABS: 10.0000 mg | ORAL_TABLET | Freq: Every day | ORAL | Status: DC

## 2012-07-17 NOTE — Telephone Encounter (Signed)
Advised her daughter this is sent in for her.

## 2012-07-17 NOTE — Telephone Encounter (Signed)
Pt daughter called, made appt with Dr. Audria Nine for 07/30/12. Is on her last amlodipine (?BP med), can we call in enough to get her to the appt?  [facility]   [place]  [place] [phone]

## 2012-07-21 ENCOUNTER — Telehealth (HOSPITAL_COMMUNITY): Payer: Self-pay | Admitting: Emergency Medicine

## 2012-07-21 NOTE — ED Notes (Signed)
No response to letter sent after 30 days. Chart sent to Medical Records. °

## 2012-07-30 ENCOUNTER — Ambulatory Visit (INDEPENDENT_AMBULATORY_CARE_PROVIDER_SITE_OTHER): Payer: Medicare Other | Admitting: Family Medicine

## 2012-07-30 ENCOUNTER — Encounter: Payer: Self-pay | Admitting: Family Medicine

## 2012-07-30 VITALS — BP 140/84 | HR 64 | Temp 98.2°F | Resp 16

## 2012-07-30 DIAGNOSIS — Z130 Encounter for screening for diseases of the blood and blood-forming organs and certain disorders involving the immune mechanism: Secondary | ICD-10-CM | POA: Diagnosis not present

## 2012-07-30 DIAGNOSIS — Z1321 Encounter for screening for nutritional disorder: Secondary | ICD-10-CM

## 2012-07-30 DIAGNOSIS — H6120 Impacted cerumen, unspecified ear: Secondary | ICD-10-CM

## 2012-07-30 DIAGNOSIS — Z1329 Encounter for screening for other suspected endocrine disorder: Secondary | ICD-10-CM | POA: Diagnosis not present

## 2012-07-30 DIAGNOSIS — R7309 Other abnormal glucose: Secondary | ICD-10-CM | POA: Diagnosis not present

## 2012-07-30 DIAGNOSIS — R739 Hyperglycemia, unspecified: Secondary | ICD-10-CM

## 2012-07-30 DIAGNOSIS — M899 Disorder of bone, unspecified: Secondary | ICD-10-CM | POA: Diagnosis not present

## 2012-07-30 DIAGNOSIS — R635 Abnormal weight gain: Secondary | ICD-10-CM | POA: Diagnosis not present

## 2012-07-30 DIAGNOSIS — Z13228 Encounter for screening for other metabolic disorders: Secondary | ICD-10-CM

## 2012-07-30 DIAGNOSIS — R232 Flushing: Secondary | ICD-10-CM | POA: Diagnosis not present

## 2012-07-30 DIAGNOSIS — H6123 Impacted cerumen, bilateral: Secondary | ICD-10-CM

## 2012-07-30 LAB — POCT GLYCOSYLATED HEMOGLOBIN (HGB A1C): Hemoglobin A1C: 5.7

## 2012-07-30 MED ORDER — CARVEDILOL 6.25 MG PO TABS: 6.2500 mg | ORAL_TABLET | Freq: Two times a day (BID) | ORAL | Status: DC

## 2012-07-31 ENCOUNTER — Other Ambulatory Visit: Payer: Self-pay | Admitting: Family Medicine

## 2012-07-31 LAB — VITAMIN D 25 HYDROXY (VIT D DEFICIENCY, FRACTURES): Vit D, 25-Hydroxy: <10 ng/mL — ABNORMAL LOW (ref 30–89)

## 2012-07-31 MED ORDER — ERGOCALCIFEROL 1.25 MG (50000 UT) PO CAPS: 50000.0000 [IU] | ORAL_CAPSULE | ORAL | Status: DC

## 2012-07-31 MED ORDER — LEVOTHYROXINE SODIUM 25 MCG PO TABS: 25.0000 ug | ORAL_TABLET | Freq: Every day | ORAL | Status: DC

## 2012-07-31 NOTE — Progress Notes (Signed)
S: This pleasant  78 y.o. AA female is here for evaluation of flushing episodes which family member reports only occur between 11-12 PM. Most recent episode was so severe that pt was transported to [facility] ED for medical assessment. No etiology could be found. It was suggested that perhaps pt was having a medication side effect. Pt has been on current medications for years. Nutrition history reveals that pt eats last meal at ~5 PM but loves to snack on sweets.  Because she is s/p CVA ~ 2 years ago, she is sedentary and has gained weight according to family member.  ROS: As per HPI; no change in hx since last ED visit.  O:  Filed Vitals:   07/30/12 1456  BP: 140/84  Pulse: 64  Temp: 98.2 F (36.8 C)  Resp: 16   GEN: In NAD; WN,WD. Pt  Is obese and sitting in wheelchair. HENT: [place]/AT; EOMI w/ clear conj and muddy sclerae. EACs occluded with cerumen (R.L).  Oroph clear and moist. COR: RRR. LUNGS: Normal resp rate and effort. SKIN: W&D; no rashes or erythema. NEURO: A&O x 3; CN deficit- mild R facial droop. Motor- R hemiplegia.  A1c= 5.7%   A/P: Hyperglycemia - advised reducing consumption of sweets. Increase fruits as snacks.  Plan: POCT glycosylated hemoglobin (Hb A1C)  Hot flashes not due to menopause- I do not think this is related to medications.  Abnormal weight gain - Plan: TSH  Encounter for vitamin deficiency screening - Plan: Vitamin D, 25-hydroxy  Cerumen impaction, bilateral - pt was referred to [facility] in 2012 for same problem. Plan: Ambulatory referral to ENT

## 2012-07-31 NOTE — Progress Notes (Signed)
Quick Note:  Please contact pt and advise that the following labs are abnormal... Thyroid blood test indicates that pt needs to be on a low dose of thyroid medication. I will e- prescribe this medication and thyroid test will be repeated at next visit.  Also Vitamin D level is extremely low; I am prescribing a once -a -week supplement and pt needs to try to get some sun exposure daily for 10-15 minutes. This test will be repeated at next visit also.  Copy to pt/ family member. ______

## 2012-08-11 ENCOUNTER — Encounter (HOSPITAL_COMMUNITY): Payer: Self-pay | Admitting: Physical Medicine and Rehabilitation

## 2012-08-11 ENCOUNTER — Emergency Department (HOSPITAL_COMMUNITY)
Admission: EM | Admit: 2012-08-11 | Discharge: 2012-08-11 | Disposition: A | Discharge status: Home / Self Care | Payer: Medicare Other | Attending: Emergency Medicine | Admitting: Emergency Medicine

## 2012-08-11 ENCOUNTER — Emergency Department (HOSPITAL_COMMUNITY): Payer: Medicare Other

## 2012-08-11 DIAGNOSIS — Z7982 Long term (current) use of aspirin: Secondary | ICD-10-CM | POA: Insufficient documentation

## 2012-08-11 DIAGNOSIS — Z79899 Other long term (current) drug therapy: Secondary | ICD-10-CM | POA: Insufficient documentation

## 2012-08-11 DIAGNOSIS — E039 Hypothyroidism, unspecified: Secondary | ICD-10-CM | POA: Insufficient documentation

## 2012-08-11 DIAGNOSIS — M109 Gout, unspecified: Secondary | ICD-10-CM | POA: Insufficient documentation

## 2012-08-11 DIAGNOSIS — I10 Essential (primary) hypertension: Secondary | ICD-10-CM | POA: Diagnosis not present

## 2012-08-11 DIAGNOSIS — Z8673 Personal history of transient ischemic attack (TIA), and cerebral infarction without residual deficits: Secondary | ICD-10-CM | POA: Insufficient documentation

## 2012-08-11 DIAGNOSIS — M19079 Primary osteoarthritis, unspecified ankle and foot: Secondary | ICD-10-CM | POA: Diagnosis not present

## 2012-08-11 DIAGNOSIS — M7989 Other specified soft tissue disorders: Secondary | ICD-10-CM | POA: Diagnosis not present

## 2012-08-11 HISTORY — DX: Hypothyroidism, unspecified: E03.9

## 2012-08-11 MED ORDER — PREDNISONE 20 MG PO TABS: 40.0000 mg | ORAL_TABLET | Freq: Every day | ORAL | Status: DC

## 2012-08-11 MED ORDER — HYDROCODONE-ACETAMINOPHEN 5-325 MG PO TABS: 2.0000 | ORAL_TABLET | Freq: Four times a day (QID) | ORAL | Status: DC | PRN

## 2012-08-11 NOTE — ED Notes (Signed)
Pt presents to department for evaluation of R great toe pain. States she fractured toe several months ago, slow healing, physician instructed her to keep splint in place. Pt states increased pain to great toe, no relief with medication. Family member concerned she could of re-injured extremity. R sided deficits noted from previous CVA.

## 2012-08-11 NOTE — ED Provider Notes (Signed)
History    This chart was scribed for non-physician practitioner working with Shelda Jakes, MD by Frederik Pear, ED Scribe. This patient was seen in room TR07C/TR07C and the patient's care was started at 1615.   CSN: [id number]  Arrival date & time 08/11/12  1400   First MD Initiated Contact with Patient 08/11/12 1615      Chief Complaint  Patient presents with  . Toe Pain    (Consider location/radiation/quality/duration/timing/severity/associated sxs/prior treatment) The history is provided by a relative, the patient and medical records. No language interpreter was used.   Carla Manning is a 78 y.o. female with a h/o of a CVA who presents to the Emergency Department complaining of sudden onset right great toe pain with gradually worsening swelling and burning pain that began last night without injury or trauma. Her daughter reports that she fractured the same toe a few months ago, and the area has been slowing healing so her PCP instructed her to keep a toe splint in place. Her daughter reports that she has been complaining the toe feels hot, but that the toe is not warm to the tough. She reports that she has a follow up visit with her PCP in a few weeks. She reports that she has been treating the pain with Aleve without minimal relief. She has no h/o of DM.  Past Medical History  Diagnosis Date  . Stroke     affected R side  . Hypertension   . Hypothyroid     No past surgical history on file.  Family History  Problem Relation Age of Onset  . Diabetes Mother   . Hypertension Daughter     History  Substance Use Topics  . Smoking status: Never Smoker   . Smokeless tobacco: Never Used  . Alcohol Use: No    OB History   Grav Para Term Preterm Abortions TAB SAB Ect Mult Living                  Review of Systems A complete 10 system review of systems was obtained and all systems are negative except as noted in the HPI and PMH.  Allergies  Review of patient's  allergies indicates no known allergies.  Home Medications   Current Outpatient Rx  Name  Route  Sig  Dispense  Refill  . amLODipine (NORVASC) 10 MG tablet   Oral   Take 1 tablet (10 mg total) by mouth daily. NEEDS OFFICE VISIT   30 tablet   0     PLEASE USE SURESCRIPTS INSTEAD FOR QUICKER REPONSE   . aspirin 325 MG tablet   Oral   Take 325 mg by mouth daily.         . carvedilol (COREG) 6.25 MG tablet   Oral   Take 1 tablet (6.25 mg total) by mouth 2 (two) times daily with a meal.   60 tablet   5     PLEASE USE SURESCRIPTS INSTEAD FOR QUICKER REPONSE   . ergocalciferol (DRISDOL) 50000 UNITS capsule   Oral   Take 1 capsule (50,000 Units total) by mouth once a week.   4 capsule   5   . levothyroxine (SYNTHROID, LEVOTHROID) 25 MCG tablet   Oral   Take 1 tablet (25 mcg total) by mouth daily before breakfast.   30 tablet   5   . naproxen sodium (ANAPROX) 220 MG tablet   Oral   Take 220 mg by mouth 3 (three) times daily with meals.         Marland Kitchen  ranitidine (ZANTAC) 150 MG capsule   Oral   Take 1 capsule (150 mg total) by mouth daily.   90 capsule   3   . predniSONE (DELTASONE) 20 MG tablet   Oral   Take 2 tablets (40 mg total) by mouth daily. Take 40 mg by mouth daily for 3 days, then 20mg  by mouth daily for 3 days, then 10mg  daily for 3 days   12 tablet   0     BP 190/77  Pulse 67  Temp(Src) 97.9 F (36.6 C) (Oral)  Resp 18  SpO2 93%  Physical Exam  Nursing note and vitals reviewed. Constitutional: She is oriented to person, place, and time. She appears well-developed and well-nourished. No distress.  HENT:  Head: Normocephalic and atraumatic.  Eyes: EOM are normal. Pupils are equal, round, and reactive to light.  Neck: Normal range of motion. Neck supple. No tracheal deviation present.  Cardiovascular: Normal rate.   Intact distal pulses and brisk capillary refill.  Pulmonary/Chest: Effort normal. No respiratory distress.  Abdominal: Soft. She  exhibits no distension.  Musculoskeletal: Normal range of motion. She exhibits no edema.  Right great toe is moderately swollen and erythematous. ROM and strength deferred secondary to pain.  Neurological: She is alert and oriented to person, place, and time.  Sensation is intact.  Skin: Skin is warm and dry.  Dry flaky skin on the foot. No signs of lesions, ulcerations, or infections.  Psychiatric: She has a normal mood and affect. Her behavior is normal.    ED Course  Procedures (including critical care time)  DIAGNOSTIC STUDIES: Oxygen Saturation is 93% on room air, adequate by my interpretation.    COORDINATION OF CARE:  16:32- Discussed planned course of treatment with the patient, including an X-ray of the right great toe, who is agreeable at this time.  18:26- Discussed X-ray findings and following up with Dr. Audria Nine adjust her medications. Will discharge with Vicodin for pain control.  Labs Reviewed - No data to display Dg Foot Complete Right  08/11/2012  *RADIOLOGY REPORT*  Clinical Data: Foot pain/swelling  RIGHT FOOT COMPLETE - 3+ VIEW  Comparison: 03/25/2012  Findings: No fracture or dislocation is seen.  Moderate degenerative changes of the first MTP joint.  Mild diffuse soft tissue swelling.  IMPRESSION: No acute osseous abnormality is seen.  Mild diffuse soft tissue swelling.   Original Report Authenticated By: Charline Bills, M.D.      1. Gout      MDM  Pt presents with monoarticular pain, swelling and erythema.  Pt is afebrile and stable. Imaging reviewed, no evidence of occult fracture or injury. Renal function good. Pt without known peptic ulcer disease and not receiving concurrent treatment on warfarin. Pt dc with prednisone taper due to age. Discussed that pt should respond to treatment with in 24 hour of begining treatment & likely resolve in 2-3 days.   I personally performed the services described in this documentation, which was scribed in my presence.  The recorded information has been reviewed and is accurate.         Roxy Horseman, PA-C 08/12/12 [phone]

## 2012-08-13 NOTE — ED Provider Notes (Signed)
Medical screening examination/treatment/procedure(s) were performed by non-physician practitioner and as supervising physician I was immediately available for consultation/collaboration.    Shelda Jakes, MD 08/13/12 [phone]

## 2012-08-14 ENCOUNTER — Other Ambulatory Visit: Payer: Self-pay | Admitting: Physician Assistant

## 2012-08-14 NOTE — Telephone Encounter (Signed)
Medication refill signed

## 2012-08-14 NOTE — Telephone Encounter (Signed)
Patient's daughter called and stated that she did not get a prescription for amlodipine sent to pharmacy.  Can you prescribe this?

## 2012-08-26 ENCOUNTER — Telehealth: Payer: Self-pay | Admitting: Radiology

## 2012-08-26 MED ORDER — ERGOCALCIFEROL 1.25 MG (50000 UT) PO CAPS: 50000.0000 [IU] | ORAL_CAPSULE | ORAL | Status: DC

## 2012-08-26 NOTE — Telephone Encounter (Signed)
Carla Manning called back and reported that pt has been taking the vit D as Rxd and has also tried to get out in the sun as advised. Daughter states that pt is still getting just as hot as before she started the thyroid med and is so hot that she can't sleep at night and has actually cried from being so uncomfortable. Please advise.

## 2012-08-26 NOTE — Telephone Encounter (Signed)
Pt had very low Vitamin D level; please find out if she is taking the once-a-week supplement prescribed. This was prescribed at the same time as the thyroid medication.

## 2012-08-26 NOTE — Telephone Encounter (Signed)
Patients daughter states she feels like the thyroid meds are not helping she feels hot and her legs are painful. Not due for follow up until July. Please advise. 30 day supply of meds were given, no refills.

## 2012-08-27 NOTE — Telephone Encounter (Signed)
Pt daughter will schedule appt.

## 2012-08-27 NOTE — Telephone Encounter (Signed)
Contact pt/ daughter to schedule follow-up appt. next week.

## 2012-08-28 ENCOUNTER — Emergency Department (HOSPITAL_COMMUNITY): Payer: Medicare Other

## 2012-08-28 ENCOUNTER — Encounter (HOSPITAL_COMMUNITY): Payer: Self-pay | Admitting: Emergency Medicine

## 2012-08-28 ENCOUNTER — Inpatient Hospital Stay (HOSPITAL_COMMUNITY)
Admission: EM | Admit: 2012-08-28 | Discharge: 2012-08-30 | DRG: 065 | Disposition: A | Discharge status: Home Health | Payer: Medicare Other | Attending: Family Medicine | Admitting: Family Medicine

## 2012-08-28 ENCOUNTER — Telehealth: Payer: Self-pay

## 2012-08-28 DIAGNOSIS — Z79899 Other long term (current) drug therapy: Secondary | ICD-10-CM

## 2012-08-28 DIAGNOSIS — I639 Cerebral infarction, unspecified: Secondary | ICD-10-CM | POA: Diagnosis present

## 2012-08-28 DIAGNOSIS — E119 Type 2 diabetes mellitus without complications: Secondary | ICD-10-CM | POA: Diagnosis not present

## 2012-08-28 DIAGNOSIS — R609 Edema, unspecified: Secondary | ICD-10-CM | POA: Diagnosis present

## 2012-08-28 DIAGNOSIS — E039 Hypothyroidism, unspecified: Secondary | ICD-10-CM | POA: Diagnosis present

## 2012-08-28 DIAGNOSIS — K219 Gastro-esophageal reflux disease without esophagitis: Secondary | ICD-10-CM | POA: Diagnosis present

## 2012-08-28 DIAGNOSIS — R569 Unspecified convulsions: Secondary | ICD-10-CM | POA: Diagnosis present

## 2012-08-28 DIAGNOSIS — I6330 Cerebral infarction due to thrombosis of unspecified cerebral artery: Principal | ICD-10-CM | POA: Diagnosis present

## 2012-08-28 DIAGNOSIS — E785 Hyperlipidemia, unspecified: Secondary | ICD-10-CM | POA: Diagnosis present

## 2012-08-28 DIAGNOSIS — I69998 Other sequelae following unspecified cerebrovascular disease: Secondary | ICD-10-CM | POA: Diagnosis not present

## 2012-08-28 DIAGNOSIS — R4701 Aphasia: Secondary | ICD-10-CM | POA: Diagnosis not present

## 2012-08-28 DIAGNOSIS — R5381 Other malaise: Secondary | ICD-10-CM | POA: Diagnosis not present

## 2012-08-28 DIAGNOSIS — I69920 Aphasia following unspecified cerebrovascular disease: Secondary | ICD-10-CM | POA: Diagnosis not present

## 2012-08-28 DIAGNOSIS — I6350 Cerebral infarction due to unspecified occlusion or stenosis of unspecified cerebral artery: Secondary | ICD-10-CM

## 2012-08-28 DIAGNOSIS — I517 Cardiomegaly: Secondary | ICD-10-CM

## 2012-08-28 DIAGNOSIS — I69959 Hemiplegia and hemiparesis following unspecified cerebrovascular disease affecting unspecified side: Secondary | ICD-10-CM | POA: Diagnosis not present

## 2012-08-28 DIAGNOSIS — I10 Essential (primary) hypertension: Secondary | ICD-10-CM | POA: Diagnosis not present

## 2012-08-28 DIAGNOSIS — I672 Cerebral atherosclerosis: Secondary | ICD-10-CM

## 2012-08-28 DIAGNOSIS — Z23 Encounter for immunization: Secondary | ICD-10-CM | POA: Diagnosis not present

## 2012-08-28 DIAGNOSIS — Z7982 Long term (current) use of aspirin: Secondary | ICD-10-CM

## 2012-08-28 DIAGNOSIS — M7989 Other specified soft tissue disorders: Secondary | ICD-10-CM | POA: Diagnosis not present

## 2012-08-28 DIAGNOSIS — R0602 Shortness of breath: Secondary | ICD-10-CM | POA: Diagnosis not present

## 2012-08-28 DIAGNOSIS — R404 Transient alteration of awareness: Secondary | ICD-10-CM | POA: Diagnosis not present

## 2012-08-28 DIAGNOSIS — R079 Chest pain, unspecified: Secondary | ICD-10-CM | POA: Diagnosis not present

## 2012-08-28 DIAGNOSIS — R5383 Other fatigue: Secondary | ICD-10-CM | POA: Diagnosis not present

## 2012-08-28 LAB — BASIC METABOLIC PANEL
CO2: 26 mEq/L (ref 19–32)
Calcium: 9.7 mg/dL (ref 8.4–10.5)
Creatinine, Ser: 1 mg/dL (ref 0.50–1.10)
GFR calc non Af Amer: 53 mL/min — ABNORMAL LOW (ref >90)
Sodium: 141 mEq/L (ref 135–145)

## 2012-08-28 LAB — CBC
HCT: 36.4 % (ref 36.0–46.0)
HCT: 35 % — ABNORMAL LOW (ref 36.0–46.0)
Hemoglobin: 12.9 g/dL (ref 12.0–15.0)
MCH: 27.5 pg (ref 26.0–34.0)
MCV: 75.4 fL — ABNORMAL LOW (ref 78.0–100.0)
RBC: 4.83 MIL/uL (ref 3.87–5.11)
RDW: 15 % (ref 11.5–15.5)
RDW: 14.8 % (ref 11.5–15.5)
WBC: 8.8 10*3/uL (ref 4.0–10.5)
WBC: 7.2 10*3/uL (ref 4.0–10.5)

## 2012-08-28 LAB — POCT I-STAT TROPONIN I: Troponin i, poc: 0.02 ng/mL (ref 0.00–0.08)

## 2012-08-28 LAB — TSH: TSH: 2.23 u[IU]/mL (ref 0.350–4.500)

## 2012-08-28 LAB — CREATININE, SERUM
GFR calc Af Amer: 63 mL/min — ABNORMAL LOW (ref >90)
GFR calc non Af Amer: 54 mL/min — ABNORMAL LOW (ref >90)

## 2012-08-28 LAB — PROTIME-INR: Prothrombin Time: 13.8 seconds (ref 11.6–15.2)

## 2012-08-28 MED ORDER — ACETAMINOPHEN 325 MG PO TABS
650.0000 mg | ORAL_TABLET | ORAL | Status: DC | PRN
  Administered 2012-08-29 (×2): 650 mg via ORAL
  Filled 2012-08-28 (×2): qty 2

## 2012-08-28 MED ORDER — ATORVASTATIN CALCIUM 80 MG PO TABS
80.0000 mg | ORAL_TABLET | Freq: Every day | ORAL | Status: DC
  Administered 2012-08-29: 80 mg via ORAL
  Filled 2012-08-28 (×4): qty 1

## 2012-08-28 MED ORDER — SENNOSIDES-DOCUSATE SODIUM 8.6-50 MG PO TABS: 1.0000 | ORAL_TABLET | Freq: Every evening | ORAL | Status: DC | PRN

## 2012-08-28 MED ORDER — ATORVASTATIN CALCIUM 40 MG PO TABS
40.0000 mg | ORAL_TABLET | Freq: Every day | ORAL | Status: DC
  Filled 2012-08-28: qty 1

## 2012-08-28 MED ORDER — PNEUMOCOCCAL VAC POLYVALENT 25 MCG/0.5ML IJ INJ
0.5000 mL | INJECTION | INTRAMUSCULAR | Status: AC
  Administered 2012-08-29: 0.5 mL via INTRAMUSCULAR
  Filled 2012-08-28: qty 0.5

## 2012-08-28 MED ORDER — SODIUM CHLORIDE 0.9 % IV SOLN
INTRAVENOUS | Status: DC
  Administered 2012-08-28: 100 mL/h via INTRAVENOUS
  Administered 2012-08-29 (×2): via INTRAVENOUS

## 2012-08-28 MED ORDER — LEVETIRACETAM 500 MG PO TABS
500.0000 mg | ORAL_TABLET | Freq: Two times a day (BID) | ORAL | Status: DC
  Administered 2012-08-29 – 2012-08-30 (×3): 500 mg via ORAL
  Filled 2012-08-28 (×5): qty 1

## 2012-08-28 MED ORDER — CLOPIDOGREL BISULFATE 75 MG PO TABS
75.0000 mg | ORAL_TABLET | Freq: Every day | ORAL | Status: DC
  Administered 2012-08-29 – 2012-08-30 (×2): 75 mg via ORAL
  Filled 2012-08-28 (×2): qty 1

## 2012-08-28 MED ORDER — ASPIRIN 81 MG PO CHEW
324.0000 mg | CHEWABLE_TABLET | Freq: Once | ORAL | Status: AC
  Administered 2012-08-28: 324 mg via ORAL
  Filled 2012-08-28: qty 4

## 2012-08-28 MED ORDER — HEPARIN SODIUM (PORCINE) 5000 UNIT/ML IJ SOLN
5000.0000 [IU] | Freq: Three times a day (TID) | INTRAMUSCULAR | Status: DC
  Administered 2012-08-28 – 2012-08-30 (×6): 5000 [IU] via SUBCUTANEOUS
  Filled 2012-08-28 (×9): qty 1

## 2012-08-28 MED ORDER — CARVEDILOL 3.125 MG PO TABS
3.1250 mg | ORAL_TABLET | Freq: Two times a day (BID) | ORAL | Status: DC
  Administered 2012-08-28 – 2012-08-30 (×4): 3.125 mg via ORAL
  Filled 2012-08-28 (×6): qty 1

## 2012-08-28 MED ORDER — LEVETIRACETAM 500 MG PO TABS
1000.0000 mg | ORAL_TABLET | Freq: Once | ORAL | Status: AC
  Administered 2012-08-28: 1000 mg via ORAL
  Filled 2012-08-28: qty 2

## 2012-08-28 MED ORDER — ONDANSETRON HCL 4 MG/2ML IJ SOLN: 4.0000 mg | Freq: Four times a day (QID) | INTRAMUSCULAR | Status: DC | PRN

## 2012-08-28 MED ORDER — ACETAMINOPHEN 650 MG RE SUPP: 650.0000 mg | RECTAL | Status: DC | PRN

## 2012-08-28 MED ORDER — FAMOTIDINE 20 MG PO TABS
20.0000 mg | ORAL_TABLET | Freq: Every day | ORAL | Status: DC
  Administered 2012-08-28 – 2012-08-30 (×3): 20 mg via ORAL
  Filled 2012-08-28 (×3): qty 1

## 2012-08-28 MED ORDER — LEVOTHYROXINE SODIUM 25 MCG PO TABS
25.0000 ug | ORAL_TABLET | Freq: Every day | ORAL | Status: DC
  Administered 2012-08-29 – 2012-08-30 (×2): 25 ug via ORAL
  Filled 2012-08-28 (×3): qty 1

## 2012-08-28 NOTE — Telephone Encounter (Signed)
To scheduling for cancellation, to Dr Audria Nine.

## 2012-08-28 NOTE — Progress Notes (Signed)
Bilateral carotid artery duplex:  No evidence of hemodynamically significant internal carotid artery stenosis.   Vertebral artery flow is antegrade.     

## 2012-08-28 NOTE — H&P (Signed)
Family Medicine Teaching Service History and Physical Service Pager [phone]  Carla Manning is an 78 y.o. female.    PCP:  Dow Adolph, MD   Chief Complaint: Weakness, aphasia HPI: Carla Manning is a 78 year old female with hx of CVA about two years ago who presents this morning with worsening R sided weakness and difficulty getting her words out.  Her family says that on Saturday she was sitting outside since it was a nice day and she was out of it, seemed drowsy, not speaking normally.  They called her PCP, who was concerned about her vitamin D level, but she improved.  Then last night, the patient called her daughter saying she just didn't feel right but could not explain well.  Her daughter came and took her to the ER.   The patient has had minimal use of her R arm/hand since her stroke 2 years ago.  She has had limited use of her R leg, but has been able to walk, her family says she drags her right foot a little bit.  She cares for herself- dresses herself, bathes herself, but lives with her son who does the cooking, cleaning, laundry, etc.   She has had chronic LE edema of R leg since her stroke.   Past Medical History  Diagnosis Date  . Stroke     affected R side  . Hypertension   . Hypothyroid     History reviewed. No pertinent past surgical history.  Family History  Problem Relation Age of Onset  . Diabetes Mother   . Hypertension Daughter    Social History:  reports that she has never smoked. She has never used smokeless tobacco. She reports that she does not drink alcohol or use illicit drugs.  Allergies: No Known Allergies   Results for orders placed during the hospital encounter of 08/28/12 (from the past 48 hour(s))  BASIC METABOLIC PANEL     Status: Abnormal   Collection Time    08/28/12  5:25 AM      Result Value Range   Sodium 141  135 - 145 mEq/L   Potassium 3.6  3.5 - 5.1 mEq/L   Chloride 105  96 - 112 mEq/L   CO2 26  19 - 32 mEq/L   Glucose, Bld 121 (*)  70 - 99 mg/dL   BUN 16  6 - 23 mg/dL   Creatinine, Ser 4.54  0.50 - 1.10 mg/dL   Calcium 9.7  8.4 - 09.8 mg/dL   GFR calc non Af Amer 53 (*) >90 mL/min   GFR calc Af Amer 62 (*) >90 mL/min   Comment:            The eGFR has been calculated     using the CKD EPI equation.     This calculation has not been     validated in all clinical     situations.     eGFR's persistently     <90 mL/min signify     possible Chronic Kidney Disease.  PROTIME-INR     Status: None   Collection Time    08/28/12  5:25 AM      Result Value Range   Prothrombin Time 13.8  11.6 - 15.2 seconds   INR 1.07  0.00 - 1.49  APTT     Status: None   Collection Time    08/28/12  5:25 AM      Result Value Range   aPTT 27  24 - 37 seconds  GLUCOSE, CAPILLARY     Status: Abnormal   Collection Time    08/28/12  5:25 AM      Result Value Range   Glucose-Capillary 101 (*) 70 - 99 mg/dL  POCT I-STAT TROPONIN I     Status: None   Collection Time    08/28/12  8:51 AM      Result Value Range   Troponin i, poc 0.02  0.00 - 0.08 ng/mL   Comment 3            Comment: Due to the release kinetics of cTnI,     a negative result within the first hours     of the onset of symptoms does not rule out     myocardial infarction with certainty.     If myocardial infarction is still suspected,     repeat the test at appropriate intervals.   Ct Head Wo Contrast  08/28/2012  *RADIOLOGY REPORT*  Clinical Data: Symptoms.  Generalized weakness.  CT HEAD WITHOUT CONTRAST  Technique:  Contiguous axial images were obtained from the base of the skull through the vertex without contrast.  Comparison: MRI brain 05/20/2010.  CT head 05/20/2010.  Findings: Mild diffuse cerebral atrophy.  Since the previous study, there is interval development of segmental low attenuation change in the left parietal lobe with sulci effacement suggesting an area of infarct, likely acute or subacute.  There is no evidence of acute intracranial hemorrhage.  No  evidence of midline shift.  No abnormal extra-axial fluid collections.  Mild ventricular dilatation consistent with central atrophy.  Patchy low attenuation changes in the deep white matter consistent small vessel ischemia. No depressed skull fractures.  Visualized paranasal sinuses and mastoid air cells are not opacified.  IMPRESSION: Interval development of segmental acute appearing infarct in the left parietal lobe consistent with involvement of the posterior left middle cerebral artery.  No acute intracranial hemorrhage.   Original Report Authenticated By: Burman Nieves, M.D.    Dg Chest Portable 1 View  08/28/2012  *RADIOLOGY REPORT*  Clinical Data: Stroke symptoms.  Chest pain.  Shortness of breath.  PORTABLE CHEST - 1 VIEW  Comparison: 05/28/2012  Findings: Shallow inspiration. The heart size and pulmonary vascularity are normal. The lungs appear clear and expanded without focal air space disease or consolidation. No blunting of the costophrenic angles.  No pneumothorax.  Mediastinal contours appear intact.  No significant change since previous study.  IMPRESSION: Shallow inspiration.  No evidence of active pulmonary disease.   Original Report Authenticated By: Burman Nieves, M.D.     ROS: Negative for fever, nausea/vomiting, diarrhea, negative for visual changes, negative for chest pain or dyspnea   Physical Exam:  Blood pressure 160/77, pulse 64, temperature 97.8 F (36.6 C), temperature source Oral, resp. rate 20, SpO2 97.00%. General appearance: alert and no distress Head: Normocephalic, without obvious abnormality, atraumatic Eyes: PERRL, EOMIT Throat: oral mucosa moist, no lesions Neck: no adenopathy, supple, symmetrical, trachea midline and thyroid not enlarged, symmetric, no tenderness/mass/nodules Lungs: clear to auscultation bilaterally Heart: regular rate and rhythm, S1, S2 normal, no murmur, click, rub or gallop Abdomen: soft, non-tender; bowel sounds normal; no masses,  no  organomegaly Extremities: R LE with 2+ pitting edema, LLE with 1+ pitting edema, R foot in post-op shoe due to broken toe  Pulses: 2+ and symmetric Neurologic: Patient has delayed reaction when asked to perform CN testing, but no focal neuro deficit on CN. She is able to speak, but it is slowed reaction.  Normal strength and sensation of Left upper and lower extremities R arm patient is not able to move at all, she has contractures of R hand.  Patient is also unable to move R leg/foot at all.  She reports normal sensation in both R upper and lower extremity.   Assessment/Plan Nicolet Griffy is a 78 y.o. female with PMHx of HTN, and CVA who presents with worsening R sided weakness and aphasia, with new CVA seen on CT Scan:   # Neuro: New CVA on CT scan, will admit to telemetry and proceed with Stroke work up.  - Will change ASA to plavix as patient already on ASA 325 since last CVA - Will also start statin for plaque stabilization - MRI head/MRA neck, carotid dopplers, and ECHO ordered - Patient has recently had some risk stratification labs done (4/1) so no need to repeat them.  Will obtain lipid panel and repeat TSH (see below).  - Neuro Checks - Stroke Swallow Screen - PT/OT to see patient  # HTN: Will hold norvasc and give 1/2 patient's normal dose of coreg in post-stroke setting for permissive HTN  #Hypothyroidism: TSH was 5.23 4 weeks ago, she was started on small dose of synthroid, will continue medication and re-check TSH  # FEN/GI: NPO until she passes stroke swallow screen, NS @ 100/hr until passes.  Heart Healthy diet when she passes swallow screen.  Continue H2 blocker for GERD, Zofran PRN nausea.   # Code Status: Full Code  #PPx: SQ Heparin  #Disposition: Pending Clinical Improvement, stroke work up, and PT/OT evaluation.   Anette Barra 08/28/2012, 9:08 AM

## 2012-08-28 NOTE — Progress Notes (Signed)
Went in to give patient her medication, patient was not responding verbally to me. This is a change in the patient status. Dr. Jeanene Erb, ordered CT of head and to hold the heparin. Will continue to monitor. Burnett Corrente, RN

## 2012-08-28 NOTE — Telephone Encounter (Signed)
Please cancel appointment for Friday.  Patient had a stroke and is in [facility] as of this morning.   Daughter Carla Manning would like to talk with Dr. Audria Nine.    [phone]

## 2012-08-28 NOTE — Telephone Encounter (Signed)
Returned call to [name]; left message and acknowledged Ms. Hilley' hospitalization. Will try to call back tomorrow.

## 2012-08-28 NOTE — ED Notes (Signed)
Patient transported to CT 

## 2012-08-28 NOTE — Progress Notes (Signed)
*  PRELIMINARY RESULTS* Echocardiogram 2D Echocardiogram has been performed.  Carla Manning 08/28/2012, 12:29 PM

## 2012-08-28 NOTE — H&P (Signed)
FMTS Attending Admit Note Patient seen and examined by me in the ED, discussed with Dr Lula Olszewski and I agree with her assessment and plan.  Additionally, patient with marked asymmetry in calf girth, with increased swelling of R calf and popliteal area compared with L.  Plan for doppler US of R leg to rule out DVT. Paula Compton, MD

## 2012-08-28 NOTE — Progress Notes (Signed)
Patient exhibiting intermittent twitching of right arm, leg, and eye.  The episodes last approximately 15 seconds.  Family Medicine paged and will come assess patient.  Lance Bosch, RN

## 2012-08-28 NOTE — Progress Notes (Signed)
Pt. exhibiting intermittent twitching of right arm, leg, and eye. The episodes last approximately 15-30 seconds. Informed Dr. Simone Curia, from family medicine,of pt. And family concerns of increased frequency and length of twitching.  Thane Edu, RN

## 2012-08-28 NOTE — Progress Notes (Signed)
VASCULAR LAB PRELIMINARY  PRELIMINARY  PRELIMINARY  PRELIMINARY  Right lower extremity venous duplex completed.    Preliminary report:  Right:  No evidence of DVT, superficial thrombosis, or Baker's cyst.  Elizeo Rodriques, RVT 08/28/2012, 11:07 AM

## 2012-08-28 NOTE — Consult Note (Signed)
Referring Physician: Dr. Mauricio Po    Chief Complaint:  Recurrent left CVA.  HPI: Geniva Lohnes is an 78 y.o. female  with a history of stroke 2 years ago with residual expressive aphasia and right hemiparesis, as well as hypertension, presenting with increasing weakness on the right side as well as increased speech output difficulty. CT was last known well at around 2 PM on 08/24/2012. Patient's been taking aspirin daily. CT scan of her head showed an acute appearing infarct involving the left parietal region indicative of left posterior middle cerebral artery stroke. NIH stroke score was 13.  LSN:  2 PM on 08/24/2012 tPA Given: No: Beyond time window for treatment consideration MRankin: 3  Past Medical History  Diagnosis Date  . Stroke     affected R side  . Hypertension   . Hypothyroid     Family History  Problem Relation Age of Onset  . Diabetes Mother   . Hypertension Daughter      Medications:  Prior to Admission:  Prescriptions prior to admission  Medication Sig Dispense Refill  . amLODipine (NORVASC) 10 MG tablet TAKE 1 TABLET (10 MG TOTAL) BY MOUTH DAILY. NEEDS OFFICE VISIT  30 tablet  0  . aspirin 325 MG tablet Take 325 mg by mouth daily.      . carvedilol (COREG) 6.25 MG tablet Take 1 tablet (6.25 mg total) by mouth 2 (two) times daily with a meal.  60 tablet  5  . HYDROcodone-acetaminophen (NORCO/VICODIN) 5-325 MG per tablet Take 2 tablets by mouth every 6 (six) hours as needed for pain.  6 tablet  0  . levothyroxine (SYNTHROID, LEVOTHROID) 25 MCG tablet Take 1 tablet (25 mcg total) by mouth daily before breakfast.  30 tablet  5  . ranitidine (ZANTAC) 150 MG capsule Take 1 capsule (150 mg total) by mouth daily.  90 capsule  3  . Vitamin D, Ergocalciferol, (DRISDOL) 50000 UNITS CAPS Take 50,000 Units by mouth every 7 (seven) days. On Thursday      . predniSONE (DELTASONE) 20 MG tablet Take 2 tablets (40 mg total) by mouth daily. Take 40 mg by mouth daily for 3 days, then  20mg  by mouth daily for 3 days, then 10mg  daily for 3 days  12 tablet  0   Scheduled: . atorvastatin  80 mg Oral q1800  . carvedilol  3.125 mg Oral BID WC  . [START ON 08/29/2012] clopidogrel  75 mg Oral Q breakfast  . famotidine  20 mg Oral Daily  . heparin  5,000 Units Subcutaneous Q8H  . [START ON 08/29/2012] levETIRAcetam  500 mg Oral Q12H  . [START ON 08/29/2012] levothyroxine  25 mcg Oral QAC breakfast  . [START ON 08/29/2012] pneumococcal 23 valent vaccine  0.5 mL Intramuscular Tomorrow-1000   WJX:BJYNWGNFAOZHY, acetaminophen, ondansetron (ZOFRAN) IV, senna-docusate   Physical Examination: Blood pressure 124/62, pulse 59, temperature 98.3 F (36.8 C), temperature source Oral, resp. rate 17, height 5' (1.524 m), weight 83.507 kg (184 lb 1.6 oz), SpO2 100.00%.  Neurologic Examination: Mental Status: Alert, moderately severe expressive aphasia with frequent paraphasic errors as well as hesitancy and a speech.   Able to follow commands with no significant receptive aphasia. Cranial Nerves: II-Visual fields were normal. III/IV/VI-Pupils were equal and reacted. Extraocular movements were full and conjugate.    V/VII-no facial numbness and no facial weakness. VIII-normal. X-mild dysarthria. Motor: Flaccid paralysis of right upper extremity; spastic paralysis of right lower extremity; normal strength and tone of left extremities. Sensory: Reduced  perception of tactile sensation over right extremities compared to left extremities. Deep Tendon Reflexes: 2+ and symmetric. Plantars: Mute bilaterally Cerebellar: Normal finger-to-nose testing with use of left upper extremity. Carotid auscultation: Normal  Ct Head Wo Contrast  08/28/2012  *RADIOLOGY REPORT*  Clinical Data: Symptoms.  Generalized weakness.  CT HEAD WITHOUT CONTRAST  Technique:  Contiguous axial images were obtained from the base of the skull through the vertex without contrast.  Comparison: MRI brain 05/20/2010.  CT head  05/20/2010.  Findings: Mild diffuse cerebral atrophy.  Since the previous study, there is interval development of segmental low attenuation change in the left parietal lobe with sulci effacement suggesting an area of infarct, likely acute or subacute.  There is no evidence of acute intracranial hemorrhage.  No evidence of midline shift.  No abnormal extra-axial fluid collections.  Mild ventricular dilatation consistent with central atrophy.  Patchy low attenuation changes in the deep white matter consistent small vessel ischemia. No depressed skull fractures.  Visualized paranasal sinuses and mastoid air cells are not opacified.  IMPRESSION: Interval development of segmental acute appearing infarct in the left parietal lobe consistent with involvement of the posterior left middle cerebral artery.  No acute intracranial hemorrhage.   Original Report Authenticated By: Burman Nieves, M.D.    Dg Chest Portable 1 View  08/28/2012  *RADIOLOGY REPORT*  Clinical Data: Stroke symptoms.  Chest pain.  Shortness of breath.  PORTABLE CHEST - 1 VIEW  Comparison: 05/28/2012  Findings: Shallow inspiration. The heart size and pulmonary vascularity are normal. The lungs appear clear and expanded without focal air space disease or consolidation. No blunting of the costophrenic angles.  No pneumothorax.  Mediastinal contours appear intact.  No significant change since previous study.  IMPRESSION: Shallow inspiration.  No evidence of active pulmonary disease.   Original Report Authenticated By: Burman Nieves, M.D.     Assessment: 78 y.o. female  presenting with recurrent left MCA territory acute ischemic stroke.  Stroke Risk Factors - family history and hypertension  Plan: 1. HgbA1c, fasting lipid panel 2. MRI, MRA  of the brain without contrast 3. PT consult, OT consult, Speech consult 4. Echocardiogram 5. Carotid dopplers 6. Prophylactic therapy-Antiplatelet med: Plavix  7. Risk factor modification 8. Telemetry  monitoring   C.R. Roseanne Reno, MD [facility] [phone]  08/28/2012, 6:57 PM

## 2012-08-28 NOTE — Progress Notes (Signed)
Interim Progress Note  Called by RN to let team know patient is having right-sided twitching lasting 15 seconds for the past 30-45 minutes in her right eye, arm, and leg. Per family, this is new and concerning to them.   Of note, at baseline patient has some dysarthria since a stroke 2 years ago, however symptoms this admission are new (wernicke's aphasia) and this now appearing right-sided twitching is new.  Will order EEG now and consider neuro consult.  Simone Curia 08/28/2012 3:30 PM

## 2012-08-28 NOTE — ED Provider Notes (Addendum)
History     CSN: [id number]  Arrival date & time 08/28/12  4098   First MD Initiated Contact with Patient 08/28/12 [phone]      Chief Complaint  Patient presents with  . Stroke Symptoms    (Consider location/radiation/quality/duration/timing/severity/associated sxs/prior treatment) HPI  Carla Manning is a generally healthy 46 hypertensive F with a history of stroke resulting in chronic right hemiparalysis. She is BIB EMS from the home where she lives with her family with new onset expressive aphasia. Sx noticed just pta. It is unclear when patient's last known well time was. The patient has insight into her expressive dysarthria and appears frustrated.   She denies headache. Denies pain in any region. She denies any new or worsening motor weakness. No CP or SOB.    Past Medical History  Diagnosis Date  . Stroke     affected R side  . Hypertension   . Hypothyroid     History reviewed. No pertinent past surgical history.  Family History  Problem Relation Age of Onset  . Diabetes Mother   . Hypertension Daughter     History  Substance Use Topics  . Smoking status: Never Smoker   . Smokeless tobacco: Never Used  . Alcohol Use: No    OB History   Grav Para Term Preterm Abortions TAB SAB Ect Mult Living                  Review of Systems  ROS limited secondary to expressive aphasia  Gen: no fever Eyes: no visual changes Nose: no epistaxis  Mouth: negative Neck: no neck pain Lungs: no SOB, cough, wheezing CV: no chest pain Abd: no abdominal pain, nausea, vomiting GU: negative MSK: no myalgias or arthralgias Neuro: as above Skin: no rash Psyche: negative.  Allergies  Review of patient's allergies indicates no known allergies.  Home Medications   Current Outpatient Rx  Name  Route  Sig  Dispense  Refill  . amLODipine (NORVASC) 10 MG tablet      TAKE 1 TABLET (10 MG TOTAL) BY MOUTH DAILY. NEEDS OFFICE VISIT   30 tablet   0   . aspirin 325 MG tablet  Oral   Take 325 mg by mouth daily.         . carvedilol (COREG) 6.25 MG tablet   Oral   Take 1 tablet (6.25 mg total) by mouth 2 (two) times daily with a meal.   60 tablet   5     PLEASE USE SURESCRIPTS INSTEAD FOR QUICKER REPONSE   . ergocalciferol (DRISDOL) 50000 UNITS capsule   Oral   Take 1 capsule (50,000 Units total) by mouth once a week.   12 capsule   1   . HYDROcodone-acetaminophen (NORCO/VICODIN) 5-325 MG per tablet   Oral   Take 2 tablets by mouth every 6 (six) hours as needed for pain.   6 tablet   0   . levothyroxine (SYNTHROID, LEVOTHROID) 25 MCG tablet   Oral   Take 1 tablet (25 mcg total) by mouth daily before breakfast.   30 tablet   5   . naproxen sodium (ANAPROX) 220 MG tablet   Oral   Take 220 mg by mouth 3 (three) times daily with meals.         . predniSONE (DELTASONE) 20 MG tablet   Oral   Take 2 tablets (40 mg total) by mouth daily. Take 40 mg by mouth daily for 3 days, then 20mg  by mouth daily  for 3 days, then 10mg  daily for 3 days   12 tablet   0   . ranitidine (ZANTAC) 150 MG capsule   Oral   Take 1 capsule (150 mg total) by mouth daily.   90 capsule   3     BP 201/107  Pulse 95  Temp(Src) 98.4 F (36.9 C) (Oral)  Resp 20  SpO2 97%  Physical Exam Gen: well developed and well nourished appearing Head: NCAT Eyes: PERL, EOMI Nose: no epistaixis or rhinorrhea Mouth/throat: mucosa is moist and pink Neck: supple, no stridor Lungs: CTA B, no wheezing, rhonchi or rales CV: RRR with frequent ectopic beats, no murmur appreciated Abd: soft, notender, nondistended Back: no ttp, no cva ttp Skin: no rashese, wnl Neuro: CN ii-xii grossly intact, no focal deficits Psyche; normal affect,  calm and cooperative.   ED Course  Procedures (including critical care time)  Results for orders placed during the hospital encounter of 08/28/12 (from the past 24 hour(s))  BASIC METABOLIC PANEL     Status: Abnormal   Collection Time     08/28/12  5:25 AM      Result Value Range   Sodium 141  135 - 145 mEq/L   Potassium 3.6  3.5 - 5.1 mEq/L   Chloride 105  96 - 112 mEq/L   CO2 26  19 - 32 mEq/L   Glucose, Bld 121 (*) 70 - 99 mg/dL   BUN 16  6 - 23 mg/dL   Creatinine, Ser 1.61  0.50 - 1.10 mg/dL   Calcium 9.7  8.4 - 09.6 mg/dL   GFR calc non Af Amer 53 (*) >90 mL/min   GFR calc Af Amer 62 (*) >90 mL/min  PROTIME-INR     Status: None   Collection Time    08/28/12  5:25 AM      Result Value Range   Prothrombin Time 13.8  11.6 - 15.2 seconds   INR 1.07  0.00 - 1.49  APTT     Status: None   Collection Time    08/28/12  5:25 AM      Result Value Range   aPTT 27  24 - 37 seconds  GLUCOSE, CAPILLARY     Status: Abnormal   Collection Time    08/28/12  5:25 AM      Result Value Range   Glucose-Capillary 101 (*) 70 - 99 mg/dL   EKG: nsr, no acute ischemic changes, normal intervals, normal axis, normal qrs complex    MDM  Patient with new onset expressive aphasia >> CVA. Noted to be hypertensive initially with BP climbing even higher. We will tx with labetalol 10mg  IV. CT brain pending. Labs and EKG pending.  Anticipate admission.    0645: additional hx obtained from the patient's daughter's. They have appreciated some mild receptive aphasia since this morning.   Results of CT brain discussed with patient and her dtrs. Diagnosis of CVA discussed along with need for admission. Tx with ASA. BP under good control at this point. We will continue to monitor closely, Plan to admit to [facility].     Brandt Loosen, MD 08/28/12 [phone]  Case discussed with FP resident who will see and admit.   Brandt Loosen, MD 08/28/12 [phone]

## 2012-08-28 NOTE — ED Notes (Signed)
Pt back from CT

## 2012-08-28 NOTE — ED Notes (Signed)
Per ems- pt reports that she woke up "not feeling well". Reports having difficulty getting her thoughts and words together however speech is clear. Pt conversates but unable to answer specific questions. Hx stroke 2 years ago with R sided paralysis. Pt follows commands. Pt a&o x4

## 2012-08-29 ENCOUNTER — Encounter (HOSPITAL_COMMUNITY): Payer: Self-pay | Admitting: Radiology

## 2012-08-29 ENCOUNTER — Inpatient Hospital Stay (HOSPITAL_COMMUNITY): Payer: Medicare Other

## 2012-08-29 DIAGNOSIS — R569 Unspecified convulsions: Secondary | ICD-10-CM | POA: Diagnosis not present

## 2012-08-29 DIAGNOSIS — E119 Type 2 diabetes mellitus without complications: Secondary | ICD-10-CM | POA: Diagnosis not present

## 2012-08-29 DIAGNOSIS — I672 Cerebral atherosclerosis: Secondary | ICD-10-CM

## 2012-08-29 DIAGNOSIS — R4701 Aphasia: Secondary | ICD-10-CM | POA: Diagnosis not present

## 2012-08-29 DIAGNOSIS — I6350 Cerebral infarction due to unspecified occlusion or stenosis of unspecified cerebral artery: Secondary | ICD-10-CM | POA: Diagnosis not present

## 2012-08-29 DIAGNOSIS — Z8673 Personal history of transient ischemic attack (TIA), and cerebral infarction without residual deficits: Secondary | ICD-10-CM

## 2012-08-29 LAB — LIPID PANEL
HDL: 64 mg/dL (ref >39)
Triglycerides: 84 mg/dL (ref <150)

## 2012-08-29 MED ORDER — AMLODIPINE BESYLATE 5 MG PO TABS
5.0000 mg | ORAL_TABLET | Freq: Every day | ORAL | Status: DC
  Administered 2012-08-29 – 2012-08-30 (×2): 5 mg via ORAL
  Filled 2012-08-29 (×2): qty 1

## 2012-08-29 NOTE — Progress Notes (Signed)
Family Medicine Teaching Service Daily Progress Note Service Page: [phone]  Subjective: doing well. Had episode of twitching yesterday in right side. Now complains of hot sensation in bilateral LE. Family member states takes 10-15 minutes for patient to fully wake up.  Objective: Temp:  [97.4 F (36.3 C)-98.3 F (36.8 C)] 97.9 F (36.6 C) (05/01 0554) Pulse Rate:  [59-67] 61 (05/01 0554) Resp:  [16-18] 16 (05/01 0554) BP: (124-169)/(58-74) 157/58 mmHg (05/01 0554) SpO2:  [98 %-100 %] 100 % (05/01 0554) Weight:  [184 lb 1.6 oz (83.507 kg)] 184 lb 1.6 oz (83.507 kg) (04/30 1300) Exam: General: NAD, resting comfortably in bed Cardiovascular: rrr, no mrg Respiratory: CTAB, no wheezes Extremities: no edema Neuro: CN 2-12 intact, has some expressive aphasia, though appears improved, right sided non mobile with decreased sensation to light touch, LUE 5/5 strength, left side with intact sensation to light touch  CBC BMET   Recent Labs Lab 08/28/12 0850 08/28/12 1510  WBC 8.8 7.2  HGB 13.3 12.9  HCT 36.4 35.0*  PLT 136* 153    Recent Labs Lab 08/28/12 0525 08/28/12 1510  NA 141  --   K 3.6  --   CL 105  --   CO2 26  --   BUN 16  --   CREATININE 1.00 0.99  GLUCOSE 121*  --   CALCIUM 9.7  --      Results for orders placed during the hospital encounter of 08/28/12 (from the past 24 hour(s))  CBC     Status: Abnormal   Collection Time    08/28/12  3:10 PM      Result Value Range   WBC 7.2  4.0 - 10.5 K/uL   RBC 4.61  3.87 - 5.11 MIL/uL   Hemoglobin 12.9  12.0 - 15.0 g/dL   HCT 45.4 (*) 09.8 - 11.9 %   MCV 75.9 (*) 78.0 - 100.0 fL   MCH 28.0  26.0 - 34.0 pg   MCHC 36.9 (*) 30.0 - 36.0 g/dL   RDW 14.7  82.9 - 56.2 %   Platelets 153  150 - 400 K/uL  CREATININE, SERUM     Status: Abnormal   Collection Time    08/28/12  3:10 PM      Result Value Range   Creatinine, Ser 0.99  0.50 - 1.10 mg/dL   GFR calc non Af Amer 54 (*) >90 mL/min   GFR calc Af Amer 63 (*) >90  mL/min  TSH     Status: None   Collection Time    08/28/12  3:10 PM      Result Value Range   TSH 2.230  0.350 - 4.500 uIU/mL  LDL CHOLESTEROL, DIRECT     Status: None   Collection Time    08/28/12  3:10 PM      Result Value Range   Direct LDL 93    LIPID PANEL     Status: Abnormal   Collection Time    08/29/12  6:50 AM      Result Value Range   Cholesterol 190  0 - 200 mg/dL   Triglycerides 84  <130 mg/dL   HDL 64  >86 mg/dL   Total CHOL/HDL Ratio 3.0     VLDL 17  0 - 40 mg/dL   LDL Cholesterol 578 (*) 0 - 99 mg/dL    Imaging/Diagnostic Tests: Ct Head Wo Contrast  08/29/2012  *RADIOLOGY REPORT*  Clinical Data: Altered mental status.  Expressive aphasia.  CT HEAD  WITHOUT CONTRAST  Technique:  Contiguous axial images were obtained from the base of the skull through the vertex without contrast.  Comparison: 08/28/2012  Findings: Large low attenuation region in the left parietal lobe consistent with infarct in the distribution of the posterior middle cerebral artery.  Distribution of changes appears similar to previous study.  There is effacement of sulci without significant midline shift.  No developing acute intracranial hemorrhage.  No abnormal extra-axial fluid collections.  Diffuse underlying cerebral atrophy.  Ventricular dilatation consistent with central atrophy.  No depressed skull fractures.  Visualized paranasal sinuses and mastoid air cells are not opacified.  Calcification of the aorta.  IMPRESSION: Stable appearance of left parietal infarct.  No evidence of any increased mass effect or acute hemorrhage.   Original Report Authenticated By: Burman Nieves, M.D.    Ct Head Wo Contrast  08/28/2012  *RADIOLOGY REPORT*  Clinical Data: Symptoms.  Generalized weakness.  CT HEAD WITHOUT CONTRAST  Technique:  Contiguous axial images were obtained from the base of the skull through the vertex without contrast.  Comparison: MRI brain 05/20/2010.  CT head 05/20/2010.  Findings: Mild diffuse  cerebral atrophy.  Since the previous study, there is interval development of segmental low attenuation change in the left parietal lobe with sulci effacement suggesting an area of infarct, likely acute or subacute.  There is no evidence of acute intracranial hemorrhage.  No evidence of midline shift.  No abnormal extra-axial fluid collections.  Mild ventricular dilatation consistent with central atrophy.  Patchy low attenuation changes in the deep white matter consistent small vessel ischemia. No depressed skull fractures.  Visualized paranasal sinuses and mastoid air cells are not opacified.  IMPRESSION: Interval development of segmental acute appearing infarct in the left parietal lobe consistent with involvement of the posterior left middle cerebral artery.  No acute intracranial hemorrhage.   Original Report Authenticated By: Burman Nieves, M.D.    Dg Chest Portable 1 View  08/28/2012  *RADIOLOGY REPORT*  Clinical Data: Stroke symptoms.  Chest pain.  Shortness of breath.  PORTABLE CHEST - 1 VIEW  Comparison: 05/28/2012  Findings: Shallow inspiration. The heart size and pulmonary vascularity are normal. The lungs appear clear and expanded without focal air space disease or consolidation. No blunting of the costophrenic angles.  No pneumothorax.  Mediastinal contours appear intact.  No significant change since previous study.  IMPRESSION: Shallow inspiration.  No evidence of active pulmonary disease.   Original Report Authenticated By: Burman Nieves, M.D.    Echo: EF 60-65%, mid-LV gradient indicative of HOCM, normal wall motion with no abnormalities  Assessment/Plan: Carla Manning is a 78 y.o. female with PMHx of HTN, and CVA who presents with worsening R sided weakness and aphasia, with new CVA seen on CT Scan   # Neuro: New CVA on CT scan, continues to have right sided weakness. Now with episode of right sided twitching concerning for seizure activity. - continue plavix  - continue statin-LDL  109 - MRI head/MRA neck, carotid dopplers  - neuro c/s-appreciate their help-rec full stroke work-up - Neuro Checks  - Stroke Swallow Screen  - PT/OT/SLP to see patient -started on Keppra 500 mg BID, will obtain EEG today   # HTN: was allowing for permissive HTN - Will start back 1/2 dose norvasc and give 1/2 patient's normal dose of coreg   #Hypothyroidism: TSH was 5.23 4 weeks ago, she was started on small dose of synthroid - TSH wnl - continue home synthroid   # FEN/GI: heart  healthy, NS @ 100/hr. Continue H2 blocker for GERD, Zofran PRN nausea.  # Code Status: Full Code  #PPx: SQ Heparin  #Disposition: Pending Clinical Improvement, stroke work up, and PT/OT evaluation.   Marikay Alar, MD 08/29/2012, 9:32 AM

## 2012-08-29 NOTE — Progress Notes (Signed)
Stroke Team Progress Note  HISTORY Carla Manning is an 78 y.o. female with a history of stroke 2 years ago with residual expressive aphasia and right hemiparesis, as well as hypertension, presenting with increasing weakness on the right side as well as increased speech output difficulty. CT was last known well at around 2 PM on 08/24/2012. Patient's been taking aspirin daily. CT scan of her head showed an acute appearing infarct involving the left parietal region indicative of left posterior middle cerebral artery stroke. NIH stroke score was 13  Patient was not a TPA candidate secondary to out of window. She was admitted to the neuro floor for further evaluation and treatment.  SUBJECTIVE  Patient lying in bed. No further complaints. Family states that during event she looked like she was "dazed". She is being unhooked from the EEG monitor now.    OBJECTIVE Most recent Vital Signs: Filed Vitals:   08/28/12 1854 08/28/12 2019 08/28/12 2230 08/29/12 0554  BP: 124/62 147/60 154/59 157/58  Pulse: 59 62 64 61  Temp:  98.2 F (36.8 C) 97.4 F (36.3 C) 97.9 F (36.6 C)  TempSrc:  Oral Oral Oral  Resp: 17 18 18 16   Height:      Weight:      SpO2: 100% 100% 98% 100%   CBG (last 3)   Recent Labs  08/28/12 0525  GLUCAP 101*    IV Fluid Intake:   . sodium chloride 100 mL/hr at 08/29/12 0900    MEDICATIONS  . amLODipine  5 mg Oral Daily  . atorvastatin  80 mg Oral q1800  . carvedilol  3.125 mg Oral BID WC  . clopidogrel  75 mg Oral Q breakfast  . famotidine  20 mg Oral Daily  . heparin  5,000 Units Subcutaneous Q8H  . levETIRAcetam  500 mg Oral Q12H  . levothyroxine  25 mcg Oral QAC breakfast  . pneumococcal 23 valent vaccine  0.5 mL Intramuscular Tomorrow-1000   PRN:  acetaminophen, acetaminophen, ondansetron (ZOFRAN) IV, senna-docusate  Diet:  Cardiac thin liquids Activity:  Up with assistance DVT Prophylaxis:  Heparin 5000units SQ Q 8 hours.  CLINICALLY SIGNIFICANT  STUDIES Basic Metabolic Panel:  Recent Labs Lab 08/28/12 0525 08/28/12 1510  NA 141  --   K 3.6  --   CL 105  --   CO2 26  --   GLUCOSE 121*  --   BUN 16  --   CREATININE 1.00 0.99  CALCIUM 9.7  --    Liver Function Tests: No results found for this basename: AST, ALT, ALKPHOS, BILITOT, PROT, ALBUMIN,  in the last 168 hours CBC:  Recent Labs Lab 08/28/12 0850 08/28/12 1510  WBC 8.8 7.2  HGB 13.3 12.9  HCT 36.4 35.0*  MCV 75.4* 75.9*  PLT 136* 153   Coagulation:  Recent Labs Lab 08/28/12 0525  LABPROT 13.8  INR 1.07   Cardiac Enzymes: No results found for this basename: CKTOTAL, CKMB, CKMBINDEX, TROPONINI,  in the last 168 hours Urinalysis: No results found for this basename: COLORURINE, APPERANCEUR, LABSPEC, PHURINE, GLUCOSEU, HGBUR, BILIRUBINUR, KETONESUR, PROTEINUR, UROBILINOGEN, NITRITE, LEUKOCYTESUR,  in the last 168 hours Lipid Panel    Component Value Date/Time   CHOL 190 08/29/2012 0650   TRIG 84 08/29/2012 0650   HDL 64 08/29/2012 0650   CHOLHDL 3.0 08/29/2012 0650   VLDL 17 08/29/2012 0650   LDLCALC 109* 08/29/2012 0650   HgbA1C  Lab Results  Component Value Date   HGBA1C 5.7 07/30/2012  Urine Drug Screen:   No results found for this basename: labopia, cocainscrnur, labbenz, amphetmu, thcu, labbarb    Alcohol Level: No results found for this basename: ETH,  in the last 168 hours  Ct Head Wo Contrast 08/29/2012   Stable appearance of left parietal infarct.  No evidence of any increased mass effect or acute hemorrhage.   08/28/2012  Interval development of segmental acute appearing infarct in the left parietal lobe consistent with involvement of the posterior left middle cerebral artery.  No acute intracranial hemorrhage.   CXR 08/28/2012: Shallow inspiration.  No evidence of active pulmonary disease.      MRI of the brain    MRA of the brain    2D Echocardiogram  EF 60% normal wall motion, normal LA size.  Carotid Doppler No significant extracranial  carotid artery stenosis demonstrated. Vertebrals are patent with antegrade flow.  EKG  normal sinus rhythm.   Therapy Recommendations   Physical Exam   Middle aged lady not in distress.Awake alert. Afebrile. Head is nontraumatic. Neck is supple without bruit. Hearing is normal. Cardiac exam no murmur or gallop. Lungs are clear to auscultation. Distal pulses are well felt. Neurologic Examination:  Mental Status:  Alert, moderately severe expressive aphasia with frequent paraphasic errors as well as hesitancy and a speech. Able to follow commands with no significant receptive aphasia.  Cranial Nerves:  II-Visual fields were normal.  III/IV/VI-Pupils were equal and reacted. Extraocular movements were full and conjugate.  V/VII-no facial numbness and no facial weakness.  VIII-normal.  X-mild dysarthria.  Motor: Flaccid paralysis of right upper extremity; spastic paralysis of right lower extremity; normal strength and tone of left extremities.  Sensory: Reduced perception of tactile sensation over right extremities compared to left extremities.  Deep Tendon Reflexes: 2+ and symmetric.  Plantars: Mute bilaterally  Cerebellar: Normal finger-to-nose testing with use of left upper extremity.  Carotid auscultation: Normal   ASSESSMENT Ms. Carla Manning is a 78 y.o. female presenting with right hemiparesis and increased expressive aphasia, more than baseline from previous stroke. Imaging confirms a left parietal lobe infarct including the left posterior MCA. Infarct felt to be thrombotic.  On aspirin 325 mg orally every day prior to admission. Now on clopidogrel 75 mg orally every day for secondary stroke prevention. Patient with resultant right hemiparesis and expressive aphasia. Work up underway.   Hypertension  Hypothyroidism  Previous stroke, left parietal  Long term high risk medication use  Hyperlipidemia, on lipitor (goal LDL < 100)  Concern for seizures, EEG pending, Keppra added.    Hospital day # 1  TREATMENT/PLAN  Continue clopidogrel 75 mg orally every day for secondary stroke prevention.  Therapy evaluations  Risk factor modifications  EEG pending  Gwendolyn Lima. Manson Passey, [facility], MBA, MHA [facility] Pager: [phone] 08/29/2012 3:35 PM   I have personally obtained a history, examined the patient, evaluated imaging results, and formulated the assessment and plan of care. I agree with the above.  Delia Heady, MD

## 2012-08-29 NOTE — Progress Notes (Signed)
EEG completed.

## 2012-08-29 NOTE — Evaluation (Signed)
Occupational Therapy Evaluation Patient Details Name: Carla Manning MRN: [id number] DOB: Jan 14, 1935 Today's Date: 08/29/2012 Time: 1100-1130 OT Time Calculation (min): 30 min  OT Assessment / Plan / Recommendation Clinical Impression  Pt admitted with weakness and aphasia. Hx of CVA and Right hemiparesis.  At baseline, pt is mod I at home with PRN assist from family.  Pt greatly limited with ADLs/mobility due to pain in Right big toe.   Will recommend ST SNF at this time unless family can provide 24/7 assist. Will continue to follow acutely.    OT Assessment  Patient needs continued OT Services    Follow Up Recommendations  SNF;Supervision/Assistance - 24 hour (home if family can provide 24/7 assist)    Barriers to Discharge      Equipment Recommendations  None recommended by OT    Recommendations for Other Services    Frequency  Min 2X/week    Precautions / Restrictions Precautions Precautions: Fall Restrictions Weight Bearing Restrictions: No   Pertinent Vitals/Pain See vitals    ADL  Upper Body Bathing: Simulated;Set up Where Assessed - Upper Body Bathing: Unsupported sitting Lower Body Bathing: Simulated;Minimal assistance Where Assessed - Lower Body Bathing: Supported standing Upper Body Dressing: Simulated;Minimal assistance Where Assessed - Upper Body Dressing: Unsupported sitting Lower Body Dressing: Performed;Moderate assistance Where Assessed - Lower Body Dressing: Supported sit to stand Toilet Transfer: Electronics engineer Method: Ambulance person:  (bed to chair) Equipment Used: Gait belt Transfers/Ambulation Related to ADLs: Max assist for squat pivot from bed to chair. Pt greatly limited by right toe pain. ADL Comments: Pt limited with donning R shoe due to pain in toe.      OT Diagnosis: Acute pain;Generalized weakness  OT Problem List: Decreased activity tolerance;Impaired balance (sitting and/or  standing);Pain OT Treatment Interventions: Self-care/ADL training;DME and/or AE instruction;Therapeutic activities;Patient/family education;Balance training   OT Goals Acute Rehab OT Goals OT Goal Formulation: With patient/family Time For Goal Achievement: 09/12/12 Potential to Achieve Goals: Good ADL Goals Pt Will Perform Lower Body Dressing: with min assist;Sit to stand from chair;Sit to stand from bed (caregiver independent in assisting) ADL Goal: Lower Body Dressing - Progress: Goal set today Pt Will Transfer to Toilet: Stand pivot transfer;with min assist;with DME (caregiver independent in assisting) ADL Goal: Toilet Transfer - Progress: Goal set today Miscellaneous OT Goals Miscellaneous OT Goal #1: Pt will perform functional mobility at min assist level with caregiver independent in assisting. OT Goal: Miscellaneous Goal #1 - Progress: Goal set today  Visit Information  Last OT Received On: 08/29/12 Assistance Needed: +1 (+2 for gait) PT/OT Co-Evaluation/Treatment: Yes    Subjective Data      Prior Functioning     Home Living Lives With: Son Available Help at Discharge: Family;Other (Comment) (10 children) Type of Home: House Home Layout: One level Bathroom Shower/Tub: Engineer, manufacturing systems: Standard Home Adaptive Equipment: Wheelchair - manual;Tub transfer bench;Straight cane (hemiwalker) Additional Comments: ` Prior Function Level of Independence: Independent with assistive device(s);Needs assistance (depending on tasks) Comments: Daughter assisted with tub transfer. Communication Communication: Expressive difficulties Dominant Hand: Right         Vision/Perception Vision - History Patient Visual Report: No change from baseline Vision - Assessment Additional Comments: Daughter reports pt has some right visual field deficits due to old CVA. Pt WFL for tasks assessed during session.   Cognition  Cognition Arousal/Alertness:  Awake/alert Behavior During Therapy: WFL for tasks assessed/performed Overall Cognitive Status: Within Functional Limits for tasks assessed  Extremity/Trunk Assessment Right Upper Extremity Assessment RUE ROM/Strength/Tone: Deficits RUE ROM/Strength/Tone Deficits: Flexion tone due to old CVA- at baseline Left Upper Extremity Assessment LUE ROM/Strength/Tone: WFL for tasks assessed Right Lower Extremity Assessment RLE ROM/Strength/Tone: Deficits RLE ROM/Strength/Tone Deficits: increased tone, painmoves in synergy Left Lower Extremity Assessment LLE ROM/Strength/Tone: [facility] for tasks assessed     Mobility Bed Mobility Bed Mobility: Supine to Sit;Sitting - Scoot to Edge of Bed Supine to Sit: 4: Min guard;With rails;HOB flat Sitting - Scoot to Edge of Bed: 4: Min guard Details for Bed Mobility Assistance: moves well to L side with use of rail that she has on her personal bed at home Transfers Transfers: Sit to Stand;Stand to Sit Sit to Stand: 2: Max assist;With upper extremity assist;From bed Stand to Sit: 3: Mod assist Details for Transfer Assistance: unable to transfer with HHA from the side due to painful R foot.  used face to face transfer.     Exercise     Balance Balance Balance Assessed: Yes Static Sitting Balance Static Sitting - Balance Support: No upper extremity supported;Left upper extremity supported;Right upper extremity supported Static Sitting - Level of Assistance: 5: Stand by assistance   End of Session OT - End of Session Equipment Utilized During Treatment: Gait belt Activity Tolerance: Patient limited by pain Patient left: with call bell/phone within reach;in chair;with family/visitor present Nurse Communication: Mobility status  GO    08/29/2012 Cipriano Mile OTR/L Pager [phone] Office [phone]  Cipriano Mile 08/29/2012, 5:34 PM

## 2012-08-29 NOTE — Evaluation (Signed)
Physical Therapy Evaluation Patient Details Name: Carla Manning MRN: [id number] DOB: 1935-03-17 Today's Date: 08/29/2012 Time: 1100-1130 PT Time Calculation (min): 30 min  PT Assessment / Plan / Recommendation Clinical Impression  patient admitted with worsening speech and R sided weakness.  On eval, mobility limited by painful R foot as well as the weakness.   Pt can benefit from PT to maximize function and independence.    PT Assessment  Patient needs continued PT services    Follow Up Recommendations  SNF;Other (comment) (vs home if 24/7 assist or pt improves back to baseline)    Does the patient have the potential to tolerate intense rehabilitation      Barriers to Discharge        Equipment Recommendations  None recommended by PT    Recommendations for Other Services     Frequency Min 3X/week    Precautions / Restrictions Precautions Precautions: Fall Restrictions Weight Bearing Restrictions: No   Pertinent Vitals/Pain       Mobility  Bed Mobility Bed Mobility: Supine to Sit;Sitting - Scoot to Edge of Bed Supine to Sit: 4: Min guard;With rails;HOB flat Sitting - Scoot to Edge of Bed: 4: Min guard Details for Bed Mobility Assistance: moves well to L side with use of rail that she has on her personal bed at home Transfers Transfers: Sit to Stand;Stand to Sit;Squat Pivot Transfers Sit to Stand: 2: Max assist;With upper extremity assist;From bed Stand to Sit: 3: Mod assist Stand Pivot Transfers:  ( ) Squat Pivot Transfers: 2: Max assist;Other (comment) (face to face transfer due to foot pain) Details for Transfer Assistance: unable to transfer with HHA from the side due to painful R foot.  used face to face transfer. Ambulation/Gait Ambulation/Gait Assistance: Not tested (comment) Stairs: No Modified Rankin (Stroke Patients Only) Pre-Morbid Rankin Score: Moderate disability Modified Rankin: Moderately severe disability    Exercises     PT Diagnosis:  Difficulty walking;Hemiplegia dominant side  PT Problem List: Decreased strength;Decreased activity tolerance;Decreased balance;Decreased mobility;Decreased safety awareness;Pain PT Treatment Interventions: DME instruction;Stair training;Functional mobility training;Therapeutic activities;Neuromuscular re-education;Patient/family education   PT Goals Acute Rehab PT Goals PT Goal Formulation: With patient/family Time For Goal Achievement: 09/12/12 Potential to Achieve Goals: Fair Pt will go Sit to Stand: with min assist PT Goal: Sit to Stand - Progress: Goal set today Pt will Transfer Bed to Chair/Chair to Bed: with min assist PT Transfer Goal: Bed to Chair/Chair to Bed - Progress: Goal set today Pt will Ambulate: 1 - 15 feet;with min assist;with least restrictive assistive device PT Goal: Ambulate - Progress: Goal set today Additional Goals Additional Goal #1: pregait at EOB with min assist PT Goal: Additional Goal #1 - Progress: Goal set today  Visit Information  Last PT Received On: 08/29/12 Assistance Needed: +1 (+2 for gait) PT/OT Co-Evaluation/Treatment: Yes    Subjective Data  Subjective: I'm sorry..... Patient Stated Goal: Home, mobile as possible   Prior Functioning  Home Living Lives With: Son Available Help at Discharge: Family;Other (Comment) (10 children) Type of Home: House Home Layout: One level Bathroom Shower/Tub: Engineer, manufacturing systems: Standard Home Adaptive Equipment: Wheelchair - manual;Tub transfer bench;Straight cane (hemiwalker) Additional Comments: ` Prior Function Level of Independence: Independent with assistive device(s);Needs assistance (depending on tasks) Communication Communication: Expressive difficulties Dominant Hand: Right    Cognition  Cognition Arousal/Alertness: Awake/alert Behavior During Therapy: WFL for tasks assessed/performed Overall Cognitive Status: Within Functional Limits for tasks assessed    Extremity/Trunk  Assessment Right Lower Extremity Assessment  RLE ROM/Strength/Tone: Deficits RLE ROM/Strength/Tone Deficits: increased tone, painmoves in synergy Left Lower Extremity Assessment LLE ROM/Strength/Tone: WFL for tasks assessed   Balance Balance Balance Assessed: Yes Static Sitting Balance Static Sitting - Balance Support: No upper extremity supported;Left upper extremity supported;Right upper extremity supported Static Sitting - Level of Assistance: 5: Stand by assistance  End of Session PT - End of Session Activity Tolerance: Patient tolerated treatment well Patient left: with call bell/phone within reach;in chair;with family/visitor present Nurse Communication: Mobility status  GP     Giovana Faciane, Eliseo Gum 08/29/2012, 2:39 PM 08/29/2012  [place] Bing, PT [phone] (pager)

## 2012-08-29 NOTE — Progress Notes (Signed)
FMTS Attending Note Patient seen and examined by me, discussed with resident team and I agree with Dr Purvis Sheffield A/P as documented in today's note.  Patient is awake and smiling, does not appear to offer effort toward verbalization.  She continues to be flaccid in her RUE/RLE.   Appreciate Neurology input.  She is started on Keppra after events yesterday which raised concern for post-CVA seizure activity.  EEG today, as well as MRI/MRA, carotid duplex study, ECHO.  High-dose statin therapy.  Plavix. Paula Compton, MD

## 2012-08-29 NOTE — Telephone Encounter (Signed)
5/2 appt with Dr. Audria Nine cancelled.

## 2012-08-30 ENCOUNTER — Ambulatory Visit: Payer: Medicare Other | Admitting: Family Medicine

## 2012-08-30 DIAGNOSIS — I6350 Cerebral infarction due to unspecified occlusion or stenosis of unspecified cerebral artery: Secondary | ICD-10-CM | POA: Diagnosis not present

## 2012-08-30 DIAGNOSIS — R4701 Aphasia: Secondary | ICD-10-CM | POA: Diagnosis not present

## 2012-08-30 LAB — BASIC METABOLIC PANEL
BUN: 10 mg/dL (ref 6–23)
Chloride: 105 mEq/L (ref 96–112)
GFR calc Af Amer: 66 mL/min — ABNORMAL LOW (ref >90)
Potassium: 3.4 mEq/L — ABNORMAL LOW (ref 3.5–5.1)

## 2012-08-30 LAB — CBC
HCT: 33.4 % — ABNORMAL LOW (ref 36.0–46.0)
Hemoglobin: 12.1 g/dL (ref 12.0–15.0)
MCHC: 36.2 g/dL — ABNORMAL HIGH (ref 30.0–36.0)
RBC: 4.35 MIL/uL (ref 3.87–5.11)
WBC: 6.2 10*3/uL (ref 4.0–10.5)

## 2012-08-30 MED ORDER — LEVETIRACETAM 500 MG PO TABS: 500.0000 mg | ORAL_TABLET | Freq: Two times a day (BID) | ORAL | Status: DC

## 2012-08-30 MED ORDER — HYDROCODONE-ACETAMINOPHEN 5-325 MG PO TABS: 2.0000 | ORAL_TABLET | Freq: Four times a day (QID) | ORAL | Status: DC | PRN

## 2012-08-30 MED ORDER — ATORVASTATIN CALCIUM 80 MG PO TABS: 80.0000 mg | ORAL_TABLET | Freq: Every day | ORAL | Status: DC

## 2012-08-30 MED ORDER — CLOPIDOGREL BISULFATE 75 MG PO TABS: 75.0000 mg | ORAL_TABLET | Freq: Every day | ORAL | Status: DC

## 2012-08-30 NOTE — Progress Notes (Signed)
Physical Therapy Treatment Patient Details Name: Carla Manning MRN: [id number] DOB: [birth year] Today's Date: 08/30/2012 Time: 1308-6578 PT Time Calculation (min): 18 min  PT Assessment / Plan / Recommendation Comments on Treatment Session  Pt and dtr demonstrated safe technique in getting OOB and walking to the chair.  They wish to do therapy at home and should be safe to D/C/    Follow Up Recommendations  Home health PT     Does the patient have the potential to tolerate intense rehabilitation     Barriers to Discharge        Equipment Recommendations  None recommended by PT    Recommendations for Other Services    Frequency Min 3X/week   Plan Discharge plan remains appropriate;Frequency remains appropriate    Precautions / Restrictions Precautions Precautions: Fall Restrictions Weight Bearing Restrictions: No   Pertinent Vitals/Pain     Mobility  Bed Mobility Bed Mobility: Supine to Sit;Sitting - Scoot to Edge of Bed Supine to Sit: 4: Min guard;With rails;HOB flat Sitting - Scoot to Edge of Bed: 5: Supervision Details for Bed Mobility Assistance: no assist needed as long as she has the L rail which she has at home Transfers Transfers: Sit to Stand;Stand to Sit Sit to Stand: 3: Mod assist Stand to Sit: 4: Min guard Details for Transfer Assistance: needed a little extra assist to get off the lower surface. Ambulation/Gait Ambulation/Gait Assistance: 4: Min assist Ambulation Distance (Feet): 15 Feet Assistive device: Other (Comment) (hemiwalker) Ambulation/Gait Assistance Details: HP gait on the R.  Has difficulty advancing the R LE, but bears wieight well. Gait Pattern:  (HP) Stairs: No Modified Rankin (Stroke Patients Only) Pre-Morbid Rankin Score: Moderate disability Modified Rankin: Moderately severe disability    Exercises     PT Diagnosis:    PT Problem List:   PT Treatment Interventions:     PT Goals Acute Rehab PT Goals Time For Goal Achievement:  09/12/12 Pt will go Sit to Stand: with min assist PT Goal: Sit to Stand - Progress: Progressing toward goal Pt will Transfer Bed to Chair/Chair to Bed: with min assist PT Transfer Goal: Bed to Chair/Chair to Bed - Progress: Progressing toward goal Pt will Ambulate: 1 - 15 feet;with min assist;with least restrictive assistive device PT Goal: Ambulate - Progress: Met Additional Goals Additional Goal #1: pregait at EOB with min assist PT Goal: Additional Goal #1 - Progress: Progressing toward goal  Visit Information  Last PT Received On: 08/30/12 Assistance Needed: +1    Subjective Data      Cognition  Cognition Arousal/Alertness: Awake/alert Behavior During Therapy: WFL for tasks assessed/performed Overall Cognitive Status: Within Functional Limits for tasks assessed    Balance  Static Sitting Balance Static Sitting - Balance Support: No upper extremity supported;Left upper extremity supported;Right upper extremity supported Static Sitting - Level of Assistance: 5: Stand by assistance  End of Session PT - End of Session Activity Tolerance: Patient tolerated treatment well Patient left: in chair;with call bell/phone within reach;with family/visitor present Nurse Communication: Mobility status   GP     Chyrel Taha, Eliseo Gum 08/30/2012, 4:09 PM 08/30/2012  [place] Bing, PT [phone]  (pager)

## 2012-08-30 NOTE — Progress Notes (Signed)
Discharged home with daughter, d/c instructions given and all questions answered by family, home with H/H PT per CM

## 2012-08-30 NOTE — Progress Notes (Signed)
Family Medicine Teaching Service Daily Progress Note Service Page: [phone]  Subjective: doing well. Family member states seemed like she was back to herself last night. Still taking 10-15 minutes to wake up, but then is normal.  Objective: Temp:  [97.7 F (36.5 C)-98.1 F (36.7 C)] 98 F (36.7 C) (05/02 0628) Pulse Rate:  [50-62] 50 (05/02 0628) Resp:  [18-19] 18 (05/02 0628) BP: (142-165)/(51-59) 160/54 mmHg (05/02 0628) SpO2:  [99 %-100 %] 100 % (05/02 4540) Exam: General: NAD, resting comfortably in bed Cardiovascular: rrr, no mrg Respiratory: CTAB, no wheezes Extremities: no edema Neuro: CN 2-12 intact, has some expressive aphasia, though appears improved, right side with improved mobility in LE, no mobility in UE, right side with decreased sensation to light touch, LUE and LLE 5/5 strength, left side with intact sensation to light touch  CBC BMET   Recent Labs Lab 08/28/12 0850 08/28/12 1510 08/30/12 0625  WBC 8.8 7.2 6.2  HGB 13.3 12.9 12.1  HCT 36.4 35.0* 33.4*  PLT 136* 153 125*    Recent Labs Lab 08/28/12 0525 08/28/12 1510 08/30/12 0625  NA 141  --  142  K 3.6  --  3.4*  CL 105  --  105  CO2 26  --  28  BUN 16  --  10  CREATININE 1.00 0.99 0.95  GLUCOSE 121*  --  99  CALCIUM 9.7  --  8.9     Results for orders placed during the hospital encounter of 08/28/12 (from the past 24 hour(s))  BASIC METABOLIC PANEL     Status: Abnormal   Collection Time    08/30/12  6:25 AM      Result Value Range   Sodium 142  135 - 145 mEq/L   Potassium 3.4 (*) 3.5 - 5.1 mEq/L   Chloride 105  96 - 112 mEq/L   CO2 28  19 - 32 mEq/L   Glucose, Bld 99  70 - 99 mg/dL   BUN 10  6 - 23 mg/dL   Creatinine, Ser 9.81  0.50 - 1.10 mg/dL   Calcium 8.9  8.4 - 19.1 mg/dL   GFR calc non Af Amer 57 (*) >90 mL/min   GFR calc Af Amer 66 (*) >90 mL/min  CBC     Status: Abnormal   Collection Time    08/30/12  6:25 AM      Result Value Range   WBC 6.2  4.0 - 10.5 K/uL   RBC 4.35   3.87 - 5.11 MIL/uL   Hemoglobin 12.1  12.0 - 15.0 g/dL   HCT 47.8 (*) 29.5 - 62.1 %   MCV 76.8 (*) 78.0 - 100.0 fL   MCH 27.8  26.0 - 34.0 pg   MCHC 36.2 (*) 30.0 - 36.0 g/dL   RDW 30.8  65.7 - 84.6 %   Platelets 125 (*) 150 - 400 K/uL    Imaging/Diagnostic Tests: Ct Head Wo Contrast  08/29/2012 IMPRESSION: Stable appearance of left parietal infarct.  No evidence of any increased mass effect or acute hemorrhage.   Original Report Authenticated By: Burman Nieves, M.D.    Mr Brain Wo Contrast  08/29/2012    IMPRESSION: Small areas of acute infarct in the high left parietal lobe and in the left medial frontal lobe.  Chronic left parietal infarct with volume loss and encephalomalacia.  Original Report Authenticated By: Janeece Riggers, M.D.    Mr Mra Head/brain Wo Cm  08/29/2012    IMPRESSION: Intracranial atherosclerotic disease unchanged  from 2012.  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   Original Report Authenticated By: Janeece Riggers, M.D.    Echo: EF 60-65%, mid-LV gradient indicative of HOCM, normal wall motion with no abnormalities  Carotid doppler: no significant stenosis  EEG: with no seizure activity  Assessment/Plan: Carla Manning is a 78 y.o. female with PMHx of HTN, and CVA who presents with worsening R sided weakness and aphasia, with new CVA seen on CT Scan   1. Neuro: New CVA on CT scan, continues to have right sided weakness. Now with episode of right sided twitching concerning for seizure activity. - continue plavix  - continue statin-LDL 109  - neuro c/s-appreciate their help-rec full stroke work-up - Neuro Checks  - Stroke Swallow Screen  - PT/OT SNF vs home with 24 hr assistance-family would prefer home and can provide 24/7 supervision - started on Keppra 500 mg BID   2. HTN: was allowing for permissive HTN - Will start back 1/2 dose norvasc and give 1/2 patient's normal dose of coreg-will increase back to  full dose today on discharge  3. Hypothyroidism: TSH was 5.23 4 weeks ago, she was started on small dose of synthroid - TSH wnl - continue home synthroid   FEN/GI: heart healthy, NS @ 100/hr. Continue H2 blocker for GERD, Zofran PRN nausea.  Code Status: Full Code  PPx: SQ Heparin  Disposition: likely discharge today   Marikay Alar, MD 08/30/2012, 7:27 AM

## 2012-08-30 NOTE — Care Management Note (Signed)
    Page 1 of 1   08/30/2012     4:02:57 PM   CARE MANAGEMENT NOTE 08/30/2012  Patient:  Carla Manning,Carla Manning   Account Number:  [id number]  Date Initiated:  08/28/2012  Documentation initiated by:  Jacquelynn Cree  Subjective/Objective Assessment:   admitted for CVA     Action/Plan:   PT/OT evals-recommended SNF, family would rather take patient home with [facility] and 24hr care   Anticipated DC Date:  08/31/2012   Anticipated DC Plan:  HOME W HOME HEALTH SERVICES      DC Planning Services  CM consult      Choice offered to / List presented to:  C-4 Adult Children        HH arranged  HH-2 PT  HH-3 OT      [facility] agency  [facility]   Status of service:  Completed, signed off Medicare Important Message given?   (If response is "NO", the following Medicare IM given date fields will be blank) Date Medicare IM given:   Date Additional Medicare IM given:    Discharge Disposition:  HOME W HOME HEALTH SERVICES  Per UR Regulation:  Reviewed for med. necessity/level of care/duration of stay  If discussed at Long Length of Stay Meetings, dates discussed:    Comments:  08/30/12 Spoke with patient and her daughter about d/c plans. Family would like to take the patient home instead of going to a SNF. Patient's daughter or son will be with her 24/7. They would like to have [facility] for HHCPT and HHOT, they have worked with [place] in the past. [employer] at [place] and set up HHPT and HHOT. No equipment needs identified. Jacquelynn Cree RN, BSN, CCM

## 2012-08-30 NOTE — Progress Notes (Signed)
FMTS Attending NOte Patient seen and examined by me, discussed with resident team and I agree with plan for discharge later today.  Optimizing her secondary stroke prevention; continuation of therapies for acute stroke that is the reason for this admission.  Paula Compton, MD

## 2012-08-30 NOTE — Clinical Social Work Note (Signed)
CSW assessed pt at bedside.  Pt daughter [name] [phone] was also present during the time of the assessment.  Pt and family refused SNF.  Pt has 24 hour care when at home.  Pt states "I have a big family".  Pt daughter, [name] and pt son will be staying with the pt around the clock.  Pt daughter states pt had [facility] set up previously and was requesting to be reinstated with this [facility] agency.  CSW advised Corrie Dandy, [place].  CSW signing off.  No other CSW needs identified.  CSW remains available to assist.  Vickii Penna, LCSWA [phone]  Clinical Social Work

## 2012-08-30 NOTE — Discharge Summary (Signed)
Physician Discharge Summary  Patient ID: Carla Manning MRN: [id number] DOB: Jul 05, 1934 Age: 78 y.o.  Admit date: 08/28/2012 Discharge date: 08/30/2012 Admitting Physician: Barbaraann Barthel, MD  PCP: Dow Adolph, MD  Consultants: Neurology     Discharge Diagnosis: Principal Problem:   CVA (cerebral vascular accident) Active Problems:   HTN (hypertension)   Partial seizure   Unspecified hypothyroidism    Hospital Course Carla Manning is a 78 y.o. female with PMHx of HTN, and CVA who presents with worsening R sided weakness and aphasia, with new CVA seen on CT Scan   1. Neuro: Presented with worsening right sided weakness. CT scan revealed new CVA in left parietal lobe. Patient started on plavix as was on ASA 325 mg daily when stroke occurred. LDL 109 and started on lipitor 80 mg daily. Day after admission patient developed right sided twitching that was felt to be potentially due to seizure activity. Was loaded with keppra and started on keppra 500 mg BID. EEG revealed no epileptiform activity. PT/OT evaluated and recommended SNF vs home with 24/7 supervision. Family felt comfortable taking patient home and she was discharged with [facility] PT/OT.  2. HTN: allowed for permissive HTN in setting of CVA on admission. Initially placed on 1/2 home dose coreg. After acute setting was over was started on 1/2 dose of home norvasc. Returned to full dosing at time of discharge.  3. Hypothyroidism: TSH was 5.23 4 weeks ago, she was started on small dose of synthroid at that time. TSH wnl. Continued home synthroid.  Problem List 1. CVA 2. Partial Seizure 3. HTN 4. Hypothyroidism    Procedures/Imaging:  Dg Chest Portable 1 View  08/28/2012   IMPRESSION: Shallow inspiration.  No evidence of active pulmonary disease.   Original Report Authenticated By: Burman Nieves, M.D.    Ct Head Wo Contrast  08/29/2012 IMPRESSION: Stable appearance of left parietal infarct. No evidence of any increased mass  effect or acute hemorrhage. Original Report Authenticated By: Burman Nieves, M.D.   Mr Brain Wo Contrast  08/29/2012 IMPRESSION: Small areas of acute infarct in the high left parietal lobe and in the left medial frontal lobe. Chronic left parietal infarct with volume loss and encephalomalacia. Original Report Authenticated By: Janeece Riggers, M.D.   Mr Mra Head/brain Wo Cm  08/29/2012 IMPRESSION: Intracranial atherosclerotic disease unchanged from 2012. 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 Original Report Authenticated By: Janeece Riggers, M.D.   Echo: EF 60-65%, mid-LV gradient indicative of HOCM, normal wall motion with no abnormalities   Carotid doppler: no significant stenosis   EEG: with no seizure activity   Labs  CBC  Recent Labs Lab 08/28/12 0850 08/28/12 1510 08/30/12 0625  WBC 8.8 7.2 6.2  HGB 13.3 12.9 12.1  HCT 36.4 35.0* 33.4*  PLT 136* 153 125*   BMET  Recent Labs Lab 08/28/12 0525 08/28/12 1510 08/30/12 0625  NA 141  --  142  K 3.6  --  3.4*  CL 105  --  105  CO2 26  --  28  BUN 16  --  10  CREATININE 1.00 0.99 0.95  CALCIUM 9.7  --  8.9  GLUCOSE 121*  --  99   Results for orders placed during the hospital encounter of 08/28/12 (from the past 72 hour(s))  BASIC METABOLIC PANEL     Status: Abnormal   Collection Time    08/28/12  5:25 AM      Result Value Range  Sodium 141  135 - 145 mEq/L   Potassium 3.6  3.5 - 5.1 mEq/L   Chloride 105  96 - 112 mEq/L   CO2 26  19 - 32 mEq/L   Glucose, Bld 121 (*) 70 - 99 mg/dL   BUN 16  6 - 23 mg/dL   Creatinine, Ser 1.61  0.50 - 1.10 mg/dL   Calcium 9.7  8.4 - 09.6 mg/dL   GFR calc non Af Amer 53 (*) >90 mL/min   GFR calc Af Amer 62 (*) >90 mL/min   Comment:            The eGFR has been calculated     using the CKD EPI equation.     This calculation has not been     validated in all clinical     situations.     eGFR's persistently     <90 mL/min  signify     possible Chronic Kidney Disease.  PROTIME-INR     Status: None   Collection Time    08/28/12  5:25 AM      Result Value Range   Prothrombin Time 13.8  11.6 - 15.2 seconds   INR 1.07  0.00 - 1.49  APTT     Status: None   Collection Time    08/28/12  5:25 AM      Result Value Range   aPTT 27  24 - 37 seconds  GLUCOSE, CAPILLARY     Status: Abnormal   Collection Time    08/28/12  5:25 AM      Result Value Range   Glucose-Capillary 101 (*) 70 - 99 mg/dL  CBC     Status: Abnormal   Collection Time    08/28/12  8:50 AM      Result Value Range   WBC 8.8  4.0 - 10.5 K/uL   RBC 4.83  3.87 - 5.11 MIL/uL   Hemoglobin 13.3  12.0 - 15.0 g/dL   HCT 04.5  40.9 - 81.1 %   MCV 75.4 (*) 78.0 - 100.0 fL   MCH 27.5  26.0 - 34.0 pg   MCHC 36.5 (*) 30.0 - 36.0 g/dL   RDW 91.4  78.2 - 95.6 %   Platelets 136 (*) 150 - 400 K/uL  POCT I-STAT TROPONIN I     Status: None   Collection Time    08/28/12  8:51 AM      Result Value Range   Troponin i, poc 0.02  0.00 - 0.08 ng/mL   Comment 3            Comment: Due to the release kinetics of cTnI,     a negative result within the first hours     of the onset of symptoms does not rule out     myocardial infarction with certainty.     If myocardial infarction is still suspected,     repeat the test at appropriate intervals.  CBC     Status: Abnormal   Collection Time    08/28/12  3:10 PM      Result Value Range   WBC 7.2  4.0 - 10.5 K/uL   RBC 4.61  3.87 - 5.11 MIL/uL   Hemoglobin 12.9  12.0 - 15.0 g/dL   HCT 21.3 (*) 08.6 - 57.8 %   MCV 75.9 (*) 78.0 - 100.0 fL   MCH 28.0  26.0 - 34.0 pg   MCHC 36.9 (*) 30.0 - 36.0 g/dL   RDW  14.8  11.5 - 15.5 %   Platelets 153  150 - 400 K/uL  CREATININE, SERUM     Status: Abnormal   Collection Time    08/28/12  3:10 PM      Result Value Range   Creatinine, Ser 0.99  0.50 - 1.10 mg/dL   GFR calc non Af Amer 54 (*) >90 mL/min   GFR calc Af Amer 63 (*) >90 mL/min   Comment:            The eGFR  has been calculated     using the CKD EPI equation.     This calculation has not been     validated in all clinical     situations.     eGFR's persistently     <90 mL/min signify     possible Chronic Kidney Disease.  TSH     Status: None   Collection Time    08/28/12  3:10 PM      Result Value Range   TSH 2.230  0.350 - 4.500 uIU/mL  LDL CHOLESTEROL, DIRECT     Status: None   Collection Time    08/28/12  3:10 PM      Result Value Range   Direct LDL 93     Comment: (NOTE)     ATP III Classification (LDL):          < 100        mg/dL         Optimal         100 - 129     mg/dL         Near or Above Optimal         130 - 159     mg/dL         Borderline High         160 - 189     mg/dL         High          > 190        mg/dL         Very High  LIPID PANEL     Status: Abnormal   Collection Time    08/29/12  6:50 AM      Result Value Range   Cholesterol 190  0 - 200 mg/dL   Triglycerides 84  <161 mg/dL   HDL 64  >09 mg/dL   Total CHOL/HDL Ratio 3.0     VLDL 17  0 - 40 mg/dL   LDL Cholesterol 604 (*) 0 - 99 mg/dL   Comment:            Total Cholesterol/HDL:CHD Risk     Coronary Heart Disease Risk Table                         Men   Women      1/2 Average Risk   3.4   3.3      Average Risk       5.0   4.4      2 X Average Risk   9.6   7.1      3 X Average Risk  23.4   11.0                Use the calculated Patient Ratio     above and the CHD Risk Table     to determine the patient's CHD Risk.  ATP III CLASSIFICATION (LDL):      <100     mg/dL   Optimal      782-956  mg/dL   Near or Above                        Optimal      130-159  mg/dL   Borderline      213-086  mg/dL   High      >578     mg/dL   Very High  BASIC METABOLIC PANEL     Status: Abnormal   Collection Time    08/30/12  6:25 AM      Result Value Range   Sodium 142  135 - 145 mEq/L   Potassium 3.4 (*) 3.5 - 5.1 mEq/L   Chloride 105  96 - 112 mEq/L   CO2 28  19 - 32 mEq/L   Glucose, Bld  99  70 - 99 mg/dL   BUN 10  6 - 23 mg/dL   Creatinine, Ser 4.69  0.50 - 1.10 mg/dL   Calcium 8.9  8.4 - 62.9 mg/dL   GFR calc non Af Amer 57 (*) >90 mL/min   GFR calc Af Amer 66 (*) >90 mL/min   Comment:            The eGFR has been calculated     using the CKD EPI equation.     This calculation has not been     validated in all clinical     situations.     eGFR's persistently     <90 mL/min signify     possible Chronic Kidney Disease.  CBC     Status: Abnormal   Collection Time    08/30/12  6:25 AM      Result Value Range   WBC 6.2  4.0 - 10.5 K/uL   RBC 4.35  3.87 - 5.11 MIL/uL   Hemoglobin 12.1  12.0 - 15.0 g/dL   HCT 52.8 (*) 41.3 - 24.4 %   MCV 76.8 (*) 78.0 - 100.0 fL   MCH 27.8  26.0 - 34.0 pg   MCHC 36.2 (*) 30.0 - 36.0 g/dL   RDW 01.0  27.2 - 53.6 %   Platelets 125 (*) 150 - 400 K/uL       Patient condition at time of discharge/disposition: stable  Disposition-home   Follow up issues: 1. See if continues to have seizure activity-will likely need f/u with neurology if continues to have seizures in post-CVA setting  Discharge follow up:  Follow-up Information   Schedule an appointment as soon as possible for a visit with [facility], MD. (for f/u early next week)    Contact information:   [address] [place] [address] [phone]       Future Appointments Provider Department Dept Phone   11/12/2012 1:00 PM Maurice March, MD [facility] [phone]       Discharge Instructions: Please refer to Patient Instructions section of EMR for full details.  Patient was counseled important signs and symptoms that should prompt return to medical care, changes in medications, dietary instructions, activity restrictions, and follow up appointments.    Discharge Medications   Medication List    STOP taking these medications       aspirin 325 MG tablet     predniSONE 20 MG tablet  Commonly known as:  DELTASONE      TAKE these  medications  amLODipine 10 MG tablet  Commonly known as:  NORVASC  TAKE 1 TABLET (10 MG TOTAL) BY MOUTH DAILY. NEEDS OFFICE VISIT     atorvastatin 80 MG tablet  Commonly known as:  LIPITOR  Take 1 tablet (80 mg total) by mouth daily at 6 PM.     carvedilol 6.25 MG tablet  Commonly known as:  COREG  Take 1 tablet (6.25 mg total) by mouth 2 (two) times daily with a meal.     clopidogrel 75 MG tablet  Commonly known as:  PLAVIX  Take 1 tablet (75 mg total) by mouth daily with breakfast.     HYDROcodone-acetaminophen 5-325 MG per tablet  Commonly known as:  NORCO/VICODIN  Take 2 tablets by mouth every 6 (six) hours as needed for pain.     levETIRAcetam 500 MG tablet  Commonly known as:  KEPPRA  Take 1 tablet (500 mg total) by mouth every 12 (twelve) hours.     levothyroxine 25 MCG tablet  Commonly known as:  SYNTHROID, LEVOTHROID  Take 1 tablet (25 mcg total) by mouth daily before breakfast.     ranitidine 150 MG capsule  Commonly known as:  ZANTAC  Take 1 capsule (150 mg total) by mouth daily.     Vitamin D (Ergocalciferol) 50000 UNITS Caps  Commonly known as:  DRISDOL  Take 50,000 Units by mouth every 7 (seven) days. On Thursday       Marikay Alar, MD of [facility] 08/31/2012 9:44 PM

## 2012-08-30 NOTE — Progress Notes (Signed)
Stroke Team Progress Note  HISTORY Carla Manning is an 78 y.o. female with a history of stroke 2 years ago with residual expressive aphasia and right hemiparesis, as well as hypertension, presenting with increasing weakness on the right side as well as increased speech output difficulty. CT was last known well at around 2 PM on 08/24/2012. Patient's been taking aspirin daily. CT scan of her head showed an acute appearing infarct involving the left parietal region indicative of left posterior middle cerebral artery stroke. NIH stroke score was 13  Patient was not a TPA candidate secondary to out of window. She was admitted to the neuro floor for further evaluation and treatment.  SUBJECTIVE Daughter Carla Manning at bedside. They are planning to provide care at time of discharge; they refuse SNF placement.  OBJECTIVE Most recent Vital Signs: Filed Vitals:   08/29/12 2132 08/30/12 0216 08/30/12 0628 08/30/12 0952  BP: 150/57 165/59 160/54 158/63  Pulse: 60 55 50 53  Temp: 98.1 F (36.7 C) 97.7 F (36.5 C) 98 F (36.7 C) 98.6 F (37 C)  TempSrc: Oral Oral Oral Oral  Resp: 18 19 18 18   Height:      Weight:      SpO2: 100% 100% 100% 100%   CBG (last 3)   Recent Labs  08/28/12 0525  GLUCAP 101*   IV Fluid Intake:   . sodium chloride 100 mL/hr at 08/29/12 1700    MEDICATIONS  . amLODipine  5 mg Oral Daily  . atorvastatin  80 mg Oral q1800  . carvedilol  3.125 mg Oral BID WC  . clopidogrel  75 mg Oral Q breakfast  . famotidine  20 mg Oral Daily  . heparin  5,000 Units Subcutaneous Q8H  . levETIRAcetam  500 mg Oral Q12H  . levothyroxine  25 mcg Oral QAC breakfast   PRN:  acetaminophen, acetaminophen, ondansetron (ZOFRAN) IV, senna-docusate  Diet:  Cardiac thin liquids Activity:  Up with assistance DVT Prophylaxis:  Heparin 5000units SQ Q 8 hours.  CLINICALLY SIGNIFICANT STUDIES Basic Metabolic Panel:   Recent Labs Lab 08/28/12 0525 08/28/12 1510 08/30/12 0625  NA 141  --   142  K 3.6  --  3.4*  CL 105  --  105  CO2 26  --  28  GLUCOSE 121*  --  99  BUN 16  --  10  CREATININE 1.00 0.99 0.95  CALCIUM 9.7  --  8.9   Liver Function Tests: No results found for this basename: AST, ALT, ALKPHOS, BILITOT, PROT, ALBUMIN,  in the last 168 hours CBC:   Recent Labs Lab 08/28/12 1510 08/30/12 0625  WBC 7.2 6.2  HGB 12.9 12.1  HCT 35.0* 33.4*  MCV 75.9* 76.8*  PLT 153 125*   Coagulation:   Recent Labs Lab 08/28/12 0525  LABPROT 13.8  INR 1.07   Cardiac Enzymes: No results found for this basename: CKTOTAL, CKMB, CKMBINDEX, TROPONINI,  in the last 168 hours Urinalysis: No results found for this basename: COLORURINE, APPERANCEUR, LABSPEC, PHURINE, GLUCOSEU, HGBUR, BILIRUBINUR, KETONESUR, PROTEINUR, UROBILINOGEN, NITRITE, LEUKOCYTESUR,  in the last 168 hours Lipid Panel    Component Value Date/Time   CHOL 190 08/29/2012 0650   TRIG 84 08/29/2012 0650   HDL 64 08/29/2012 0650   CHOLHDL 3.0 08/29/2012 0650   VLDL 17 08/29/2012 0650   LDLCALC 109* 08/29/2012 0650   HgbA1C  Lab Results  Component Value Date   HGBA1C 5.7 07/30/2012    Urine Drug Screen:   No results  found for this basename: labopia,  cocainscrnur,  labbenz,  amphetmu,  thcu,  labbarb    Alcohol Level: No results found for this basename: ETH,  in the last 168 hours  Ct Head Wo Contrast 08/29/2012   Stable appearance of left parietal infarct.  No evidence of any increased mass effect or acute hemorrhage.   08/28/2012  Interval development of segmental acute appearing infarct in the left parietal lobe consistent with involvement of the posterior left middle cerebral artery.  No acute intracranial hemorrhage.   CXR 08/28/2012: Shallow inspiration.  No evidence of active pulmonary disease.      MRI of the brain    MRA of the brain    2D Echocardiogram  EF 60% normal wall motion, normal LA size.  Carotid Doppler No significant extracranial carotid artery stenosis demonstrated. Vertebrals are patent  with antegrade flow.  EKG  normal sinus rhythm.  EEG This is a normal EEG recording during wakefulness and during sleep. No evidence of an epileptic disorder was demonstrated.   Therapy Recommendations home health PT, SNF by OT  Physical Exam   Middle aged lady not in distress.Awake alert. Afebrile. Head is nontraumatic. Neck is supple without bruit. Hearing is normal. Cardiac exam no murmur or gallop. Lungs are clear to auscultation. Distal pulses are well felt. Neurologic Examination:  Mental Status:  Alert, moderately severe expressive aphasia with frequent paraphasic errors as well as hesitancy and a speech. Able to follow commands with no significant receptive aphasia.  Cranial Nerves:  II-Visual fields were normal.  III/IV/VI-Pupils were equal and reacted. Extraocular movements were full and conjugate.  V/VII-no facial numbness and no facial weakness.  VIII-normal.  X-mild dysarthria.  Motor: Flaccid paralysis of right upper extremity; spastic paralysis of right lower extremity; normal strength and tone of left extremities.  Sensory: Reduced perception of tactile sensation over right extremities compared to left extremities.  Deep Tendon Reflexes: 2+ and symmetric.  Plantars: Mute bilaterally  Cerebellar: Normal finger-to-nose testing with use of left upper extremity.  Carotid auscultation: Normal  ASSESSMENT Ms. Carla Manning is a 78 y.o. female presenting with right hemiparesis and increased expressive aphasia, more than baseline from previous stroke. Imaging confirms a left parietal lobe infarct including the left posterior MCA. Infarct felt to be thrombotic.  On aspirin 325 mg orally every day prior to admission. Now on clopidogrel 75 mg orally every day for secondary stroke prevention. Patient with resultant right hemiparesis and expressive aphasia. Work up underway.   Hypertension  Hypothyroidism  Previous stroke, left parietal  Long term high risk medication  use  Hyperlipidemia, LDL 109, on lipitor (goal LDL < 100)  Concern for seizures, EEG unrevealing, Keppra added.   HgbA1c 5.7  Hospital day # 2  TREATMENT/PLAN  Continue clopidogrel 75 mg orally every day for secondary stroke prevention.  Continue keppra  Risk factor modifications by primary MD  Home with family. Therapy per their recommendations.  Annie Main, MSN, RN, ANVP-BC, ANP-BC, [facility] Pager: [phone] 08/30/2012 11:05 AM  I have personally obtained a history, examined the patient, evaluated imaging results, and formulated the assessment and plan of care. I agree with the above. Delia Heady, MD

## 2012-08-30 NOTE — Procedures (Signed)
EEG NUMBER:  [id number].  INDICATION FOR STUDY:  A 78 year old lady with recurrent acute stroke with new onset right focal motor seizure activity as well.  DESCRIPTION:  This is a routine EEG recording performed during wakefulness and during sleep.  Predominant background activity during wakefulness consisted of 9 Hz symmetrical alpha rhythm, which attenuated well with eye opening.  During drowsiness, there was slowing of background activity with mixed irregular.  Delta and theta activity diffusely and symmetrically.  During stage II sleep symmetrical sleep spindles, and K complexes were recorded in addition to slowing of background activity symmetrically.  Photic stimulation was not performed.  Hyperventilation was not performed.  No epileptiform discharges were recorded.  There was no abnormal slowing.  INTERPRETATION:  This is a normal EEG recording during wakefulness and during sleep.  No evidence of an epileptic disorder was demonstrated.     Noel Christmas, MD    [id number] D:  08/29/2012 16:57:49  T:  08/30/2012 05:08:00  Job #:  [id number]

## 2012-08-31 DIAGNOSIS — M109 Gout, unspecified: Secondary | ICD-10-CM | POA: Diagnosis not present

## 2012-08-31 DIAGNOSIS — G40802 Other epilepsy, not intractable, without status epilepticus: Secondary | ICD-10-CM | POA: Diagnosis not present

## 2012-08-31 DIAGNOSIS — I69998 Other sequelae following unspecified cerebrovascular disease: Secondary | ICD-10-CM | POA: Diagnosis not present

## 2012-08-31 DIAGNOSIS — I69959 Hemiplegia and hemiparesis following unspecified cerebrovascular disease affecting unspecified side: Secondary | ICD-10-CM | POA: Diagnosis not present

## 2012-08-31 DIAGNOSIS — I69920 Aphasia following unspecified cerebrovascular disease: Secondary | ICD-10-CM | POA: Diagnosis not present

## 2012-08-31 DIAGNOSIS — I639 Cerebral infarction, unspecified: Secondary | ICD-10-CM | POA: Diagnosis present

## 2012-08-31 DIAGNOSIS — E039 Hypothyroidism, unspecified: Secondary | ICD-10-CM | POA: Diagnosis present

## 2012-09-02 ENCOUNTER — Telehealth: Payer: Self-pay

## 2012-09-02 NOTE — Telephone Encounter (Signed)
Called Carla Manning, patient was in hospital, d/c on May 2nd Dr Charlean Merl was the hospitalist who gave orders for the therapy, there is a plan of care, will you sign orders? Same therapist seeing her again, she has had CVA already, 6 weeks of PT / OT recommended. They need verbal order to proceed.

## 2012-09-02 NOTE — Telephone Encounter (Signed)
I called [phone] and left a message for Carla Manning with [name]; I am authorizing PT/OT as ordered by discharging physician. Therapy is authorized for 6 weeks; if more time is needed, they can contact us for extension.

## 2012-09-02 NOTE — Telephone Encounter (Signed)
Beverly brown from [facility] calling to talk with dr Audria Nine regarding her physical therapy with them please call [phone]

## 2012-09-04 ENCOUNTER — Telehealth: Payer: Self-pay

## 2012-09-04 NOTE — Telephone Encounter (Signed)
Pt is home after recent CVA; HH w/ [name]- PT/OT approved by me. Debria Garret, RN assessed pt and states that Speech Therapy is needed. I authorized this also.

## 2012-09-04 NOTE — Telephone Encounter (Signed)
BEVERLY BROWN, NURSE WITH [facility], IS CALLING TO INFORM DR.MCPHERSON THAT PATIENT HAS HAD ANOTHER STROKE. NURSE STATES THAT PT/OT WERE ORDERED BUT IS REQUESTING THAT SPEECH THERAPY BE ORDERED AS WELL. STATES THAT PATIENT'S SPEECH IS GETTING WORSE AND WANTS A VERBAL ORDER FOR SPEECH THERAPY. CALL BACK: [phone].

## 2012-09-06 ENCOUNTER — Telehealth: Payer: Self-pay

## 2012-09-06 NOTE — Telephone Encounter (Signed)
Dr.Mcpherson, Pts daughter [name] would like to speak with you regarding her mothers health. She states that pt still in pain and experiencing a burning sensation in her legs. Best# [phone]

## 2012-09-06 NOTE — Telephone Encounter (Signed)
Called asked if her legs are swelling (she is on Amlodipine) they are not swelling, they do not feel hot to the touch, patient states no relief with hydrocodone ( makes her sleepy). Daughter does not feel like the burning/ pain is related to her Lipitor either, it started before she started this medication, to you, to advise. ? Return to clinic

## 2012-09-09 MED ORDER — AMLODIPINE BESYLATE 10 MG PO TABS: 10.0000 mg | ORAL_TABLET | Freq: Every day | ORAL | Status: DC

## 2012-09-09 NOTE — Telephone Encounter (Signed)
I spoke w/ daughter [name]; pt still c/o burning in legs hs (2-3 AM). Work-up in hospital inconclusive. PT mentioned "neuropathy"; I explained to daughter that there may be SL spine problem (although pt has never had significant c/o low back pain). Pt will be scheduled to come in w/in next few weeks instead of waiting until July for return visit.

## 2012-09-11 ENCOUNTER — Telehealth: Payer: Self-pay | Admitting: Family Medicine

## 2012-09-11 NOTE — Telephone Encounter (Signed)
Faxed order signed by Dr. Audria Nine for speech eval & treat.

## 2012-09-18 ENCOUNTER — Telehealth: Payer: Self-pay | Admitting: Family Medicine

## 2012-09-18 NOTE — Telephone Encounter (Signed)
Faxed speech therapy plan of care form

## 2012-09-20 ENCOUNTER — Ambulatory Visit: Payer: Medicare Other | Admitting: Family Medicine

## 2012-09-25 ENCOUNTER — Telehealth: Payer: Self-pay

## 2012-09-25 NOTE — Telephone Encounter (Signed)
Harriett Sine - OT needs order for In home OT two times a week for 3 more weeks for strength and balance  Dr. Mordecai Rasmussen CBN is [phone]

## 2012-09-25 NOTE — Telephone Encounter (Signed)
Please advise if okay to extend OTx 3 more wks

## 2012-09-27 ENCOUNTER — Telehealth: Payer: Self-pay | Admitting: Radiology

## 2012-09-27 NOTE — Telephone Encounter (Signed)
Spoke again to the OT at [facility] advised her message was sent to Dr Leonia Reader. Will advise, she could not take order from one of our PA's

## 2012-09-29 NOTE — Telephone Encounter (Signed)
Yes, the extension is authorized for 3 more weeks.

## 2012-10-01 NOTE — Telephone Encounter (Signed)
Called Harriett Sine to advise.

## 2012-10-11 ENCOUNTER — Telehealth: Payer: Self-pay

## 2012-10-11 NOTE — Telephone Encounter (Signed)
Nurse would like an order for working with stroke patient.   For 3 times a week for two more weeks.  Patient had a second stroke.  Sending progress note.  Will request recertification order later.  [phone] Debria Garret

## 2012-10-12 NOTE — Telephone Encounter (Signed)
I spoke with Carla Manning about this pt; she needs authorization for for extended PT. Also pt continues to have chronic foot pain; questionable hx of gout but not taking medication for this. Pt has narcotic pain med but not wanting to use it for fear of pt falling or having some other type of accident. Nurse trying desensitization therapy w/ minimal benefit. I will order some labs, esp checking for gout.  Pt also needing new light-weight WC.  LABS- CMET, URIC ACID, CBC.   Order faxed to: [facility] @ [phone]. Nurse can drawn blood and have labs done.

## 2012-10-17 ENCOUNTER — Telehealth: Payer: Self-pay | Admitting: *Deleted

## 2012-10-17 NOTE — Telephone Encounter (Signed)
Faxed signed  physician order to [facility] at 9:45 a. m., per Dr Audria Nine.

## 2012-10-23 DIAGNOSIS — R7989 Other specified abnormal findings of blood chemistry: Secondary | ICD-10-CM | POA: Diagnosis not present

## 2012-10-23 DIAGNOSIS — M79609 Pain in unspecified limb: Secondary | ICD-10-CM | POA: Diagnosis not present

## 2012-10-23 DIAGNOSIS — E876 Hypokalemia: Secondary | ICD-10-CM | POA: Diagnosis not present

## 2012-10-30 ENCOUNTER — Telehealth: Payer: Self-pay

## 2012-10-30 DIAGNOSIS — M109 Gout, unspecified: Secondary | ICD-10-CM | POA: Diagnosis not present

## 2012-10-30 DIAGNOSIS — I69959 Hemiplegia and hemiparesis following unspecified cerebrovascular disease affecting unspecified side: Secondary | ICD-10-CM | POA: Diagnosis not present

## 2012-10-30 DIAGNOSIS — G40802 Other epilepsy, not intractable, without status epilepticus: Secondary | ICD-10-CM | POA: Diagnosis not present

## 2012-10-30 DIAGNOSIS — I69920 Aphasia following unspecified cerebrovascular disease: Secondary | ICD-10-CM | POA: Diagnosis not present

## 2012-10-30 DIAGNOSIS — I69998 Other sequelae following unspecified cerebrovascular disease: Secondary | ICD-10-CM | POA: Diagnosis not present

## 2012-10-30 NOTE — Telephone Encounter (Signed)
Home health nurse Meriam Sprague is calling to get an order for a social worker for the patient. Patient needs a wheelchair and ramp. Nurse also has some other concerns she wants to discuss about the patient. Best number: [phone]

## 2012-10-30 NOTE — Telephone Encounter (Signed)
Can you give social work eval order? Patient was here last in April

## 2012-10-31 NOTE — Telephone Encounter (Signed)
I left a msg for [employer] and gave verbal auth for SW, WC and ramp.

## 2012-11-05 DIAGNOSIS — I69998 Other sequelae following unspecified cerebrovascular disease: Secondary | ICD-10-CM | POA: Diagnosis not present

## 2012-11-05 DIAGNOSIS — I69959 Hemiplegia and hemiparesis following unspecified cerebrovascular disease affecting unspecified side: Secondary | ICD-10-CM | POA: Diagnosis not present

## 2012-11-05 DIAGNOSIS — G40802 Other epilepsy, not intractable, without status epilepticus: Secondary | ICD-10-CM | POA: Diagnosis not present

## 2012-11-05 DIAGNOSIS — I69920 Aphasia following unspecified cerebrovascular disease: Secondary | ICD-10-CM | POA: Diagnosis not present

## 2012-11-05 DIAGNOSIS — M109 Gout, unspecified: Secondary | ICD-10-CM | POA: Diagnosis not present

## 2012-11-07 DIAGNOSIS — I69920 Aphasia following unspecified cerebrovascular disease: Secondary | ICD-10-CM | POA: Diagnosis not present

## 2012-11-07 DIAGNOSIS — I69998 Other sequelae following unspecified cerebrovascular disease: Secondary | ICD-10-CM | POA: Diagnosis not present

## 2012-11-07 DIAGNOSIS — M109 Gout, unspecified: Secondary | ICD-10-CM | POA: Diagnosis not present

## 2012-11-07 DIAGNOSIS — I69959 Hemiplegia and hemiparesis following unspecified cerebrovascular disease affecting unspecified side: Secondary | ICD-10-CM | POA: Diagnosis not present

## 2012-11-07 DIAGNOSIS — G40802 Other epilepsy, not intractable, without status epilepticus: Secondary | ICD-10-CM | POA: Diagnosis not present

## 2012-11-11 DIAGNOSIS — G40802 Other epilepsy, not intractable, without status epilepticus: Secondary | ICD-10-CM | POA: Diagnosis not present

## 2012-11-11 DIAGNOSIS — I69998 Other sequelae following unspecified cerebrovascular disease: Secondary | ICD-10-CM | POA: Diagnosis not present

## 2012-11-11 DIAGNOSIS — I69920 Aphasia following unspecified cerebrovascular disease: Secondary | ICD-10-CM | POA: Diagnosis not present

## 2012-11-11 DIAGNOSIS — M109 Gout, unspecified: Secondary | ICD-10-CM | POA: Diagnosis not present

## 2012-11-11 DIAGNOSIS — I69959 Hemiplegia and hemiparesis following unspecified cerebrovascular disease affecting unspecified side: Secondary | ICD-10-CM | POA: Diagnosis not present

## 2012-11-12 ENCOUNTER — Ambulatory Visit: Payer: Medicare Other | Admitting: Family Medicine

## 2012-11-13 DIAGNOSIS — I69959 Hemiplegia and hemiparesis following unspecified cerebrovascular disease affecting unspecified side: Secondary | ICD-10-CM | POA: Diagnosis not present

## 2012-11-13 DIAGNOSIS — I69920 Aphasia following unspecified cerebrovascular disease: Secondary | ICD-10-CM | POA: Diagnosis not present

## 2012-11-13 DIAGNOSIS — I69998 Other sequelae following unspecified cerebrovascular disease: Secondary | ICD-10-CM | POA: Diagnosis not present

## 2012-11-13 DIAGNOSIS — M109 Gout, unspecified: Secondary | ICD-10-CM | POA: Diagnosis not present

## 2012-11-13 DIAGNOSIS — G40802 Other epilepsy, not intractable, without status epilepticus: Secondary | ICD-10-CM | POA: Diagnosis not present

## 2012-11-19 DIAGNOSIS — M109 Gout, unspecified: Secondary | ICD-10-CM | POA: Diagnosis not present

## 2012-11-19 DIAGNOSIS — G40802 Other epilepsy, not intractable, without status epilepticus: Secondary | ICD-10-CM | POA: Diagnosis not present

## 2012-11-19 DIAGNOSIS — I69959 Hemiplegia and hemiparesis following unspecified cerebrovascular disease affecting unspecified side: Secondary | ICD-10-CM | POA: Diagnosis not present

## 2012-11-19 DIAGNOSIS — I69920 Aphasia following unspecified cerebrovascular disease: Secondary | ICD-10-CM | POA: Diagnosis not present

## 2012-11-19 DIAGNOSIS — I69998 Other sequelae following unspecified cerebrovascular disease: Secondary | ICD-10-CM | POA: Diagnosis not present

## 2012-11-21 DIAGNOSIS — G40802 Other epilepsy, not intractable, without status epilepticus: Secondary | ICD-10-CM | POA: Diagnosis not present

## 2012-11-21 DIAGNOSIS — M109 Gout, unspecified: Secondary | ICD-10-CM | POA: Diagnosis not present

## 2012-11-21 DIAGNOSIS — I69998 Other sequelae following unspecified cerebrovascular disease: Secondary | ICD-10-CM | POA: Diagnosis not present

## 2012-11-21 DIAGNOSIS — I69959 Hemiplegia and hemiparesis following unspecified cerebrovascular disease affecting unspecified side: Secondary | ICD-10-CM | POA: Diagnosis not present

## 2012-11-21 DIAGNOSIS — I69920 Aphasia following unspecified cerebrovascular disease: Secondary | ICD-10-CM | POA: Diagnosis not present

## 2012-11-25 ENCOUNTER — Telehealth: Payer: Self-pay

## 2012-11-25 NOTE — Telephone Encounter (Signed)
Dr. Audria Nine:  Patient needs a refill of her seizure meds. Daughter says her therapist has spoken with you about her inability to come in for her visit. Please advise. Daughters name [name], best number is [phone].  Per daughter, patient is unable to communicate.  [facility] [address].

## 2012-11-26 DIAGNOSIS — M109 Gout, unspecified: Secondary | ICD-10-CM | POA: Diagnosis not present

## 2012-11-26 DIAGNOSIS — I69920 Aphasia following unspecified cerebrovascular disease: Secondary | ICD-10-CM | POA: Diagnosis not present

## 2012-11-26 DIAGNOSIS — G40802 Other epilepsy, not intractable, without status epilepticus: Secondary | ICD-10-CM | POA: Diagnosis not present

## 2012-11-26 DIAGNOSIS — I69959 Hemiplegia and hemiparesis following unspecified cerebrovascular disease affecting unspecified side: Secondary | ICD-10-CM | POA: Diagnosis not present

## 2012-11-26 DIAGNOSIS — I69998 Other sequelae following unspecified cerebrovascular disease: Secondary | ICD-10-CM | POA: Diagnosis not present

## 2012-11-27 MED ORDER — LEVETIRACETAM 500 MG PO TABS: 500.0000 mg | ORAL_TABLET | Freq: Two times a day (BID) | ORAL | Status: DC

## 2012-11-27 NOTE — Telephone Encounter (Signed)
Patients daughter advised, voice mail

## 2012-11-27 NOTE — Telephone Encounter (Signed)
Refill for generic Keppra has been routed to pt's pharmacy. Please advise pt's daughter of this. Thank you.

## 2012-11-29 DIAGNOSIS — I69998 Other sequelae following unspecified cerebrovascular disease: Secondary | ICD-10-CM | POA: Diagnosis not present

## 2012-11-29 DIAGNOSIS — I69920 Aphasia following unspecified cerebrovascular disease: Secondary | ICD-10-CM | POA: Diagnosis not present

## 2012-11-29 DIAGNOSIS — I69959 Hemiplegia and hemiparesis following unspecified cerebrovascular disease affecting unspecified side: Secondary | ICD-10-CM | POA: Diagnosis not present

## 2012-11-29 DIAGNOSIS — M109 Gout, unspecified: Secondary | ICD-10-CM | POA: Diagnosis not present

## 2012-11-29 DIAGNOSIS — G40802 Other epilepsy, not intractable, without status epilepticus: Secondary | ICD-10-CM | POA: Diagnosis not present

## 2012-12-03 DIAGNOSIS — I69998 Other sequelae following unspecified cerebrovascular disease: Secondary | ICD-10-CM | POA: Diagnosis not present

## 2012-12-03 DIAGNOSIS — I69959 Hemiplegia and hemiparesis following unspecified cerebrovascular disease affecting unspecified side: Secondary | ICD-10-CM | POA: Diagnosis not present

## 2012-12-03 DIAGNOSIS — I69920 Aphasia following unspecified cerebrovascular disease: Secondary | ICD-10-CM | POA: Diagnosis not present

## 2012-12-03 DIAGNOSIS — G40802 Other epilepsy, not intractable, without status epilepticus: Secondary | ICD-10-CM | POA: Diagnosis not present

## 2012-12-03 DIAGNOSIS — M109 Gout, unspecified: Secondary | ICD-10-CM | POA: Diagnosis not present

## 2012-12-05 DIAGNOSIS — I69959 Hemiplegia and hemiparesis following unspecified cerebrovascular disease affecting unspecified side: Secondary | ICD-10-CM | POA: Diagnosis not present

## 2012-12-05 DIAGNOSIS — I69998 Other sequelae following unspecified cerebrovascular disease: Secondary | ICD-10-CM | POA: Diagnosis not present

## 2012-12-05 DIAGNOSIS — M109 Gout, unspecified: Secondary | ICD-10-CM | POA: Diagnosis not present

## 2012-12-05 DIAGNOSIS — G40802 Other epilepsy, not intractable, without status epilepticus: Secondary | ICD-10-CM | POA: Diagnosis not present

## 2012-12-05 DIAGNOSIS — I69920 Aphasia following unspecified cerebrovascular disease: Secondary | ICD-10-CM | POA: Diagnosis not present

## 2012-12-09 DIAGNOSIS — I69998 Other sequelae following unspecified cerebrovascular disease: Secondary | ICD-10-CM | POA: Diagnosis not present

## 2012-12-09 DIAGNOSIS — I69920 Aphasia following unspecified cerebrovascular disease: Secondary | ICD-10-CM | POA: Diagnosis not present

## 2012-12-09 DIAGNOSIS — I69959 Hemiplegia and hemiparesis following unspecified cerebrovascular disease affecting unspecified side: Secondary | ICD-10-CM | POA: Diagnosis not present

## 2012-12-09 DIAGNOSIS — G40802 Other epilepsy, not intractable, without status epilepticus: Secondary | ICD-10-CM | POA: Diagnosis not present

## 2012-12-09 DIAGNOSIS — M109 Gout, unspecified: Secondary | ICD-10-CM | POA: Diagnosis not present

## 2012-12-11 DIAGNOSIS — G40802 Other epilepsy, not intractable, without status epilepticus: Secondary | ICD-10-CM | POA: Diagnosis not present

## 2012-12-11 DIAGNOSIS — I69998 Other sequelae following unspecified cerebrovascular disease: Secondary | ICD-10-CM | POA: Diagnosis not present

## 2012-12-11 DIAGNOSIS — M109 Gout, unspecified: Secondary | ICD-10-CM | POA: Diagnosis not present

## 2012-12-11 DIAGNOSIS — I69920 Aphasia following unspecified cerebrovascular disease: Secondary | ICD-10-CM | POA: Diagnosis not present

## 2012-12-11 DIAGNOSIS — I69959 Hemiplegia and hemiparesis following unspecified cerebrovascular disease affecting unspecified side: Secondary | ICD-10-CM | POA: Diagnosis not present

## 2012-12-17 DIAGNOSIS — M109 Gout, unspecified: Secondary | ICD-10-CM | POA: Diagnosis not present

## 2012-12-17 DIAGNOSIS — G40802 Other epilepsy, not intractable, without status epilepticus: Secondary | ICD-10-CM | POA: Diagnosis not present

## 2012-12-17 DIAGNOSIS — I69998 Other sequelae following unspecified cerebrovascular disease: Secondary | ICD-10-CM | POA: Diagnosis not present

## 2012-12-17 DIAGNOSIS — I69920 Aphasia following unspecified cerebrovascular disease: Secondary | ICD-10-CM | POA: Diagnosis not present

## 2012-12-17 DIAGNOSIS — I69959 Hemiplegia and hemiparesis following unspecified cerebrovascular disease affecting unspecified side: Secondary | ICD-10-CM | POA: Diagnosis not present

## 2012-12-19 DIAGNOSIS — G40802 Other epilepsy, not intractable, without status epilepticus: Secondary | ICD-10-CM | POA: Diagnosis not present

## 2012-12-19 DIAGNOSIS — I69959 Hemiplegia and hemiparesis following unspecified cerebrovascular disease affecting unspecified side: Secondary | ICD-10-CM | POA: Diagnosis not present

## 2012-12-19 DIAGNOSIS — M109 Gout, unspecified: Secondary | ICD-10-CM | POA: Diagnosis not present

## 2012-12-19 DIAGNOSIS — I69998 Other sequelae following unspecified cerebrovascular disease: Secondary | ICD-10-CM | POA: Diagnosis not present

## 2012-12-19 DIAGNOSIS — I69920 Aphasia following unspecified cerebrovascular disease: Secondary | ICD-10-CM | POA: Diagnosis not present

## 2012-12-24 DIAGNOSIS — I69998 Other sequelae following unspecified cerebrovascular disease: Secondary | ICD-10-CM | POA: Diagnosis not present

## 2012-12-24 DIAGNOSIS — I69959 Hemiplegia and hemiparesis following unspecified cerebrovascular disease affecting unspecified side: Secondary | ICD-10-CM | POA: Diagnosis not present

## 2012-12-24 DIAGNOSIS — I69920 Aphasia following unspecified cerebrovascular disease: Secondary | ICD-10-CM | POA: Diagnosis not present

## 2012-12-24 DIAGNOSIS — M109 Gout, unspecified: Secondary | ICD-10-CM | POA: Diagnosis not present

## 2012-12-24 DIAGNOSIS — G40802 Other epilepsy, not intractable, without status epilepticus: Secondary | ICD-10-CM | POA: Diagnosis not present

## 2012-12-26 DIAGNOSIS — G40802 Other epilepsy, not intractable, without status epilepticus: Secondary | ICD-10-CM | POA: Diagnosis not present

## 2012-12-26 DIAGNOSIS — I69959 Hemiplegia and hemiparesis following unspecified cerebrovascular disease affecting unspecified side: Secondary | ICD-10-CM | POA: Diagnosis not present

## 2012-12-26 DIAGNOSIS — I69920 Aphasia following unspecified cerebrovascular disease: Secondary | ICD-10-CM | POA: Diagnosis not present

## 2012-12-26 DIAGNOSIS — I69998 Other sequelae following unspecified cerebrovascular disease: Secondary | ICD-10-CM | POA: Diagnosis not present

## 2012-12-26 DIAGNOSIS — M109 Gout, unspecified: Secondary | ICD-10-CM | POA: Diagnosis not present

## 2013-01-22 ENCOUNTER — Other Ambulatory Visit: Payer: Self-pay | Admitting: Family Medicine

## 2013-01-27 ENCOUNTER — Emergency Department (HOSPITAL_COMMUNITY)
Admission: EM | Admit: 2013-01-27 | Discharge: 2013-01-27 | Disposition: A | Discharge status: Home / Self Care | Payer: Medicare Other | Attending: Emergency Medicine | Admitting: Emergency Medicine

## 2013-01-27 ENCOUNTER — Encounter (HOSPITAL_COMMUNITY): Payer: Self-pay | Admitting: *Deleted

## 2013-01-27 DIAGNOSIS — M79609 Pain in unspecified limb: Secondary | ICD-10-CM | POA: Diagnosis not present

## 2013-01-27 DIAGNOSIS — Z8781 Personal history of (healed) traumatic fracture: Secondary | ICD-10-CM | POA: Insufficient documentation

## 2013-01-27 DIAGNOSIS — M7989 Other specified soft tissue disorders: Secondary | ICD-10-CM | POA: Diagnosis not present

## 2013-01-27 DIAGNOSIS — R6889 Other general symptoms and signs: Secondary | ICD-10-CM | POA: Diagnosis not present

## 2013-01-27 DIAGNOSIS — I10 Essential (primary) hypertension: Secondary | ICD-10-CM | POA: Diagnosis not present

## 2013-01-27 DIAGNOSIS — R609 Edema, unspecified: Secondary | ICD-10-CM | POA: Diagnosis not present

## 2013-01-27 DIAGNOSIS — M79604 Pain in right leg: Secondary | ICD-10-CM

## 2013-01-27 DIAGNOSIS — Z8673 Personal history of transient ischemic attack (TIA), and cerebral infarction without residual deficits: Secondary | ICD-10-CM | POA: Diagnosis not present

## 2013-01-27 DIAGNOSIS — M25579 Pain in unspecified ankle and joints of unspecified foot: Secondary | ICD-10-CM | POA: Insufficient documentation

## 2013-01-27 DIAGNOSIS — Z79899 Other long term (current) drug therapy: Secondary | ICD-10-CM | POA: Diagnosis not present

## 2013-01-27 DIAGNOSIS — E039 Hypothyroidism, unspecified: Secondary | ICD-10-CM | POA: Diagnosis not present

## 2013-01-27 DIAGNOSIS — Z7902 Long term (current) use of antithrombotics/antiplatelets: Secondary | ICD-10-CM | POA: Diagnosis not present

## 2013-01-27 MED ORDER — HYDROCODONE-ACETAMINOPHEN 5-325 MG PO TABS: 1.0000 | ORAL_TABLET | Freq: Four times a day (QID) | ORAL | Status: DC | PRN

## 2013-01-27 NOTE — ED Provider Notes (Signed)
CSN: [id number]     Arrival date & time 01/27/13  9147 History   First MD Initiated Contact with Patient 01/27/13 [phone]     Chief Complaint  Patient presents with  . Leg Swelling  . Foot Pain    right   (Consider location/radiation/quality/duration/timing/severity/associated sxs/prior Treatment) HPI Comments: Patient is a 78 year old female with a history of stroke in May 2014 with residual right-sided weakness and hx of 1st metatarsal fracture in 03/2012 who presents for R foot pain x 4 months. Patient states that pain has been persistent and is worse with palpation; believe pain has been worsening as of late. Symptoms also associated with swelling and warmth in her RLE. Patient denies redness. Relative at bedside states that swelling of RLE is chronic after stroke in May and usually improves with elevation. Patient has been followed by PT for this reason. Patient on Plavix since stroke. Patient denies fever, SOB, numbness/tingling, and worsening weakness from baseline.  Patient is a 78 y.o. female presenting with lower extremity pain. The history is provided by the patient. No language interpreter was used.  Foot Pain Associated symptoms include arthralgias. Pertinent negatives include no chest pain, fever or numbness.    Past Medical History  Diagnosis Date  . Stroke     affected R side  . Hypertension   . Hypothyroid    History reviewed. No pertinent past surgical history. Family History  Problem Relation Age of Onset  . Diabetes Mother   . Hypertension Daughter    History  Substance Use Topics  . Smoking status: Never Smoker   . Smokeless tobacco: Never Used  . Alcohol Use: No   OB History   Grav Para Term Preterm Abortions TAB SAB Ect Mult Living                 Review of Systems  Constitutional: Negative for fever.  Respiratory: Negative for shortness of breath.   Cardiovascular: Positive for leg swelling. Negative for chest pain.  Musculoskeletal: Positive for  arthralgias.  Skin: Negative for color change and pallor.  Neurological: Negative for numbness.  All other systems reviewed and are negative.    Allergies  Review of patient's allergies indicates no known allergies.  Home Medications   Current Outpatient Rx  Name  Route  Sig  Dispense  Refill  . amLODipine (NORVASC) 10 MG tablet   Oral   Take 1 tablet (10 mg total) by mouth daily.   30 tablet   5   . atorvastatin (LIPITOR) 80 MG tablet   Oral   Take 1 tablet (80 mg total) by mouth daily at 6 PM.   90 tablet   3   . carvedilol (COREG) 6.25 MG tablet   Oral   Take 1 tablet (6.25 mg total) by mouth 2 (two) times daily with a meal. Needs office visit/labs   60 tablet   0   . clopidogrel (PLAVIX) 75 MG tablet   Oral   Take 1 tablet (75 mg total) by mouth daily with breakfast.   90 tablet   3   . levETIRAcetam (KEPPRA) 500 MG tablet   Oral   Take 1 tablet (500 mg total) by mouth every 12 (twelve) hours.   60 tablet   3   . levothyroxine (SYNTHROID, LEVOTHROID) 25 MCG tablet   Oral   Take 1 tablet (25 mcg total) by mouth daily before breakfast. Needs office visit/labs   30 tablet   0   . ranitidine (ZANTAC)  150 MG capsule   Oral   Take 1 capsule (150 mg total) by mouth daily.   90 capsule   3   . Vitamin D, Ergocalciferol, (DRISDOL) 50000 UNITS CAPS   Oral   Take 50,000 Units by mouth every 7 (seven) days. On Thursday         . HYDROcodone-acetaminophen (NORCO/VICODIN) 5-325 MG per tablet   Oral   Take 1 tablet by mouth every 6 (six) hours as needed for pain.   7 tablet   0    BP 151/65  Pulse 62  Temp(Src) 98.5 F (36.9 C) (Oral)  Resp 18  Ht 5' (1.524 m)  Wt 168 lb (76.204 kg)  BMI 32.81 kg/m2  SpO2 95%  Physical Exam  Nursing note and vitals reviewed. Constitutional: She is oriented to person, place, and time. She appears well-developed and well-nourished. No distress.  HENT:  Head: Normocephalic and atraumatic.  Eyes: Conjunctivae and  EOM are normal. No scleral icterus.  Neck: Normal range of motion.  Cardiovascular: Normal rate, regular rhythm and intact distal pulses.   DP and PT pulses 2+ bilaterally. Capillary refill normal.  Pulmonary/Chest: Effort normal. No respiratory distress.  Musculoskeletal: Normal range of motion.  Swelling, 3+ pitting edema, and mild heat-to-touch of RLE compared to LLE. No erythema.  Neurological: She is alert and oriented to person, place, and time.  No gross sensory deficits appreciated.  Skin: Skin is warm and dry. No rash noted. She is not diaphoretic. No erythema. No pallor.  Psychiatric: She has a normal mood and affect. Her behavior is normal.    ED Course  Procedures (including critical care time) Labs Review Labs Reviewed - No data to display  Imaging Review No results found.  VASCULAR LAB  PRELIMINARY PRELIMINARY PRELIMINARY PRELIMINARY  Right lower extremity venous duplex completed.  Preliminary report: Right: No evidence of DVT, superficial thrombosis, or Baker's cyst.  SLAUGHTER, [place], RVS  01/27/2013, 11:07 AM  MDM   1. Leg pain, right    78 year old female with a history of stroke with residual right-sided weakness, currently on Plavix, presents for right leg pain and heat-to-touch. Physical exam findings as above. Patient is well and nontoxic appearing, afebrile, and hemodynamically stable. No gross sensory deficits appreciated in the right lower extremity and strength at baseline. There is 3+ pitting edema, but daughter states that this is chronic since patient's stroke. No new trauma or injuries. Venous duplex of the right lower extremity ordered to further evaluate for DVT; ultrasound negative. No erythema appreciated to the right lower extremities to suspect underlying cellulitis or infectious joint process. Leave the patient to be stable and appropriate for discharge with primary care followup as needed. Rest and elevation recommended and Norco prescribed for  pain control as needed. Return precautions provided and family agreeable to plan with no unaddressed concerns.  Patient seen also by Dr. Ethelda Chick who is in agreement with this workup, assessment, management plan, and patient's stability for discharge.    Antony Madura, PA-C 01/28/13 [phone]

## 2013-01-27 NOTE — ED Provider Notes (Signed)
Patient complains of pain at right foot for several months. She also complains of swelling at right lower leg for several months. On exam patient is alert nontoxic regular 70 with 2+ pretibial edema DP pulse 2+ is mildly tender at the first MTP joint no redness or warmth. Clinically patient has no evidence of cellulitis.  Doug Sou, MD 01/27/13 [phone]

## 2013-01-27 NOTE — ED Notes (Signed)
Patient with previous strokes before and had right sided weakness as a result, patient in today with c/o right sided hotness with right leg swelling and right foot pain, patient with recent broken right foot and is in post op shoe

## 2013-01-27 NOTE — Progress Notes (Signed)
VASCULAR LAB PRELIMINARY  PRELIMINARY  PRELIMINARY  PRELIMINARY  Right lower extremity venous duplex completed.    Preliminary report:  Right:  No evidence of DVT, superficial thrombosis, or Baker's cyst.  Carla Manning, RVS 01/27/2013, 11:07 AM

## 2013-01-29 NOTE — ED Provider Notes (Signed)
Medical screening examination/treatment/procedure(s) were conducted as a shared visit with non-physician practitioner(s) and myself.  I personally evaluated the patient during the encounter  Doug Sou, MD 01/29/13 1139

## 2013-02-24 ENCOUNTER — Other Ambulatory Visit: Payer: Self-pay | Admitting: Physician Assistant

## 2013-02-24 ENCOUNTER — Other Ambulatory Visit: Payer: Self-pay | Admitting: Family Medicine

## 2013-03-06 ENCOUNTER — Ambulatory Visit: Payer: Medicare Other

## 2013-03-06 ENCOUNTER — Ambulatory Visit (INDEPENDENT_AMBULATORY_CARE_PROVIDER_SITE_OTHER): Payer: Medicare Other | Admitting: Family Medicine

## 2013-03-06 ENCOUNTER — Ambulatory Visit: Payer: Self-pay

## 2013-03-06 VITALS — BP 132/80 | HR 57 | Temp 97.9°F | Resp 16

## 2013-03-06 DIAGNOSIS — Z862 Personal history of diseases of the blood and blood-forming organs and certain disorders involving the immune mechanism: Secondary | ICD-10-CM | POA: Diagnosis not present

## 2013-03-06 DIAGNOSIS — Z8739 Personal history of other diseases of the musculoskeletal system and connective tissue: Secondary | ICD-10-CM

## 2013-03-06 DIAGNOSIS — M79674 Pain in right toe(s): Secondary | ICD-10-CM

## 2013-03-06 DIAGNOSIS — M79609 Pain in unspecified limb: Secondary | ICD-10-CM | POA: Diagnosis not present

## 2013-03-06 DIAGNOSIS — B351 Tinea unguium: Secondary | ICD-10-CM | POA: Diagnosis not present

## 2013-03-06 DIAGNOSIS — L089 Local infection of the skin and subcutaneous tissue, unspecified: Secondary | ICD-10-CM | POA: Diagnosis not present

## 2013-03-06 LAB — POCT CBC
Granulocyte percent: 59.9 %G (ref 37–80)
HCT, POC: 41.1 % (ref 37.7–47.9)
MCH, POC: 27.1 pg (ref 27–31.2)
MCV: 83.8 fL (ref 80–97)
POC LYMPH PERCENT: 33.4 %L (ref 10–50)
RBC: 4.91 M/uL (ref 4.04–5.48)
WBC: 5 10*3/uL (ref 4.6–10.2)

## 2013-03-06 MED ORDER — CEPHALEXIN 500 MG PO CAPS: 500.0000 mg | ORAL_CAPSULE | Freq: Four times a day (QID) | ORAL | Status: DC

## 2013-03-06 MED ORDER — HYDROCODONE-ACETAMINOPHEN 5-325 MG PO TABS: 1.0000 | ORAL_TABLET | Freq: Four times a day (QID) | ORAL | Status: DC | PRN

## 2013-03-06 MED ORDER — CEPHALEXIN 500 MG PO CAPS: 500.0000 mg | ORAL_CAPSULE | Freq: Three times a day (TID) | ORAL | Status: DC

## 2013-03-06 NOTE — Patient Instructions (Signed)
Take the antibiotic one 3 times daily, Keflex, for infection in toe  Use the pain pills only when needed  Look at toe frequently to make sure it is not getting worse  Return if problems

## 2013-03-06 NOTE — Progress Notes (Signed)
Subjective: 44 sure lady who has had a couple of strokes in the past and has partial right her paresis. She can talk a little bit, but her daughter who is with her is the main spokesperson. The patient has a history of having broken her l right large toe last December. Then she has had problems with it off and on. One night she had to go to the emergency room a couple months ago for it. Her son who lives with her was trimming her nails 2 days ago and noted pus coming from underneath the large toe. Large toes been increasingly painful. She apparently has had a history of gout.  The toe has been hurting bad enough that she cries with it at times at night. They have been giving her occasional hydrocodone for that, but they are about out of medicine.  Objective: Right large toe does not appear severely abnormal in color or swelling. It may be a little bit red, but her whole foot has a slight reddish appearance. She cannot move the foot on her own much. She has on toenail polish, making the nail beds not visible. The toenails have marked onychomycosis with a very thick large toenail. She has a little blood blister on the medial aspect of the cuticle of the large toe. She is tender to touch from the MTP area of the foot all the way down the toe. Motion of the toe hurt quite a lot.  Assessment: Toe pain Infection right large toe with history of pus drainage Possible gout Old fracture of toe  Plan: CBC, uric acid  Results for orders placed in visit on 03/06/13  POCT CBC      Result Value Range   WBC 5.0  4.6 - 10.2 K/uL   Lymph, poc 1.7  0.6 - 3.4   POC LYMPH PERCENT 33.4  10 - 50 %L   MID (cbc) 0.3  0 - 0.9   POC MID % 6.7  0 - 12 %M   POC Granulocyte 3.0  2 - 6.9   Granulocyte percent 59.9  37 - 80 %G   RBC 4.91  4.04 - 5.48 M/uL   Hemoglobin 13.3  12.2 - 16.2 g/dL   HCT, POC 84.1  32.4 - 47.9 %   MCV 83.8  80 - 97 fL   MCH, POC 27.1  27 - 31.2 pg   MCHC 32.4  31.8 - 35.4 g/dL   RDW, POC  40.1     Platelet Count, POC 148  142 - 424 K/uL   MPV 9.8  0 - 99.8 fL   [facility] reading (PRIMARY) by  Dr. Alwyn Ren Mild degenerative changes but no fracture or acute problems noted  .   Will treat with Keflex. Continue some pain pills for as needed use. Advised to minimize use.  May need other treatment if gout looks apparent on uric acid level.

## 2013-03-20 ENCOUNTER — Other Ambulatory Visit: Payer: Self-pay | Admitting: Family Medicine

## 2013-04-08 ENCOUNTER — Encounter: Payer: Self-pay | Admitting: Family Medicine

## 2013-04-08 ENCOUNTER — Ambulatory Visit (INDEPENDENT_AMBULATORY_CARE_PROVIDER_SITE_OTHER): Payer: Medicare Other | Admitting: Family Medicine

## 2013-04-08 VITALS — BP 160/70 | HR 66 | Temp 98.7°F | Resp 16

## 2013-04-08 DIAGNOSIS — I10 Essential (primary) hypertension: Secondary | ICD-10-CM

## 2013-04-08 DIAGNOSIS — E559 Vitamin D deficiency, unspecified: Secondary | ICD-10-CM | POA: Diagnosis not present

## 2013-04-08 DIAGNOSIS — Z760 Encounter for issue of repeat prescription: Secondary | ICD-10-CM

## 2013-04-08 DIAGNOSIS — R209 Unspecified disturbances of skin sensation: Secondary | ICD-10-CM | POA: Diagnosis not present

## 2013-04-08 DIAGNOSIS — Z23 Encounter for immunization: Secondary | ICD-10-CM | POA: Diagnosis not present

## 2013-04-08 DIAGNOSIS — M79609 Pain in unspecified limb: Secondary | ICD-10-CM | POA: Diagnosis not present

## 2013-04-08 DIAGNOSIS — E039 Hypothyroidism, unspecified: Secondary | ICD-10-CM | POA: Diagnosis not present

## 2013-04-08 LAB — COMPREHENSIVE METABOLIC PANEL
Albumin: 4.3 g/dL (ref 3.5–5.2)
BUN: 15 mg/dL (ref 6–23)
CO2: 28 mEq/L (ref 19–32)
Calcium: 9.6 mg/dL (ref 8.4–10.5)
Glucose, Bld: 106 mg/dL — ABNORMAL HIGH (ref 70–99)
Potassium: 4.1 mEq/L (ref 3.5–5.3)
Sodium: 141 mEq/L (ref 135–145)
Total Protein: 7.7 g/dL (ref 6.0–8.3)

## 2013-04-08 MED ORDER — LEVETIRACETAM 500 MG PO TABS: ORAL_TABLET | ORAL | Status: DC

## 2013-04-08 MED ORDER — VITAMIN D (ERGOCALCIFEROL) 1.25 MG (50000 UNIT) PO CAPS: 50000.0000 [IU] | ORAL_CAPSULE | ORAL | Status: DC

## 2013-04-08 MED ORDER — LEVOTHYROXINE SODIUM 25 MCG PO TABS: 25.0000 ug | ORAL_TABLET | Freq: Every day | ORAL | Status: DC

## 2013-04-08 MED ORDER — CARVEDILOL 6.25 MG PO TABS: ORAL_TABLET | ORAL | Status: DC

## 2013-04-08 MED ORDER — AMLODIPINE BESYLATE 10 MG PO TABS: 10.0000 mg | ORAL_TABLET | Freq: Every day | ORAL | Status: DC

## 2013-04-08 NOTE — Patient Instructions (Signed)
I have refilled all medications. Lab results will be available before end of the week. The blood vessel study of the right leg artery will be scheduled after May 01, 2013. You will need to go to [facility] for this test.

## 2013-04-09 ENCOUNTER — Encounter: Payer: Self-pay | Admitting: Family Medicine

## 2013-04-09 DIAGNOSIS — R202 Paresthesia of skin: Secondary | ICD-10-CM | POA: Insufficient documentation

## 2013-04-09 DIAGNOSIS — M79609 Pain in unspecified limb: Secondary | ICD-10-CM | POA: Insufficient documentation

## 2013-04-09 LAB — TSH: TSH: 3.008 u[IU]/mL (ref 0.350–4.500)

## 2013-04-09 NOTE — Progress Notes (Signed)
Subjective:    Patient ID: Carla Manning, female    DOB: 1934/12/26, 78 y.o.   MRN: [id number]  HPI  This 78 y.o. AA female is here for follow-up HTN, DM and chronic paresthesia involving R side. Since last visit w/ me; pt was hospitalized w/ CVA in April 2014; she has residual R-sided hemiplegia and mild dysarthria. Pt has persistent c/o R foot pain and swelling; she was seen last month at 102 and treated for R foot pain (gout ruled out) and big toe infection. Antibiotics completed. R foot is swollen and pt wears a post-op shoe; she has hx of fractured R great toe in remote past.  Patient Active Problem List   Diagnosis Date Noted  . CVA (cerebral vascular accident) 08/31/2012  . Unspecified hypothyroidism 08/31/2012  . Partial seizure 08/29/2012  . GERD (gastroesophageal reflux disease) 05/30/2011  . HTN (hypertension) 05/30/2011  . Cerebrovascular disease, arteriosclerotic, post-stroke 05/13/2010   PMHx, Surg Hx and Soc Hx reviewed. Medications reconciled.   Review of Systems  Constitutional: Negative for fever, activity change and appetite change.  Respiratory: Negative for cough, choking, chest tightness and shortness of breath.   Cardiovascular: Positive for leg swelling. Negative for chest pain.  Musculoskeletal: Positive for arthralgias and joint swelling.  Skin: Positive for color change.       R foot swollen and slightly red; great toe painful with thickened, deformed nail.  Neurological: Positive for facial asymmetry, speech difficulty and weakness. Negative for syncope, light-headedness and headaches.       Post-CVA residua.  Psychiatric/Behavioral: Positive for sleep disturbance.       Symptoms worsen at night.       Objective:   Physical Exam  Nursing note and vitals reviewed. Constitutional: She is oriented to person, place, and time. She appears well-developed and well-nourished. No distress.  HENT:  Head: Normocephalic and atraumatic.  Right Ear: External ear  normal.  Left Ear: External ear normal.  Mouth/Throat: Oropharynx is clear and moist.  Eyes: Conjunctivae and EOM are normal. Pupils are equal, round, and reactive to light. No scleral icterus.  Cardiovascular: Normal rate and regular rhythm.   Pulmonary/Chest: Effort normal. No respiratory distress.  Musculoskeletal:       Right ankle: She exhibits swelling. She exhibits normal range of motion and no deformity. Tenderness.  R foot- distal 1/2 (forefoot) tender to touch w/ erythema and swelling.   Neurological: She is alert and oriented to person, place, and time.  Speech is slowed w/ mild-moderate dysarthria. R-sided weakness and decreased motor strength.  Skin: Skin is warm, dry and intact. She is not diaphoretic.  R foot- cool to touch; plantar aspect slightly discolored and mottled. Great toenail is thickened and discolored.  Psychiatric: She has a normal mood and affect. Her behavior is normal.      Assessment & Plan:  Paresthesia and pain of right extremity - Pt has had LE venous duplex study (negative) x 2 this year; no record of arterial study. Plan: Comprehensive metabolic panel, Lower Extremity Arterial Duplex Right  Unspecified hypothyroidism - Continue low dose Levothyroxine pending labs.  Plan: TSH  HTN (hypertension) - Stable; continue current medications. Plan: Comprehensive metabolic panel  Issue of repeat prescriptions  Unspecified vitamin D deficiency - Pt currently not taking Vitamin D supplement; previous level < 10. Plan: Vitamin D, 25-hydroxy  Need for prophylactic vaccination and inoculation against influenza - Plan: Flu Vaccine QUAD 36+ mos IM  Meds ordered this encounter  Medications  .  amLODipine (NORVASC) 10 MG tablet    Sig: Take 1 tablet (10 mg total) by mouth daily.    Dispense:  30 tablet    Refill:  5  . carvedilol (COREG) 6.25 MG tablet    Sig: TAKE 1 TABLET (6.25 MG TOTAL) BY MOUTH 2 (TWO) TIMES DAILY WITH A MEAL.    Dispense:  60 tablet     Refill:  5  . levETIRAcetam (KEPPRA) 500 MG tablet    Sig: TAKE 1 TABLET (500 MG TOTAL) BY MOUTH EVERY 12 (TWELVE) HOURS.    Dispense:  60 tablet    Refill:  5  . levothyroxine (SYNTHROID, LEVOTHROID) 25 MCG tablet    Sig: Take 1 tablet (25 mcg total) by mouth daily before breakfast.    Dispense:  30 tablet    Refill:  5  . Vitamin D, Ergocalciferol, (DRISDOL) 50000 UNITS CAPS capsule    Sig: Take 1 capsule (50,000 Units total) by mouth every 7 (seven) days.    Dispense:  4 capsule    Refill:  2

## 2013-04-11 NOTE — Progress Notes (Signed)
Quick Note:  Please advise pt regarding following labs...  Blood sugar is a little above normal. Kidney function is has declined to what it was 2 years ago. Increase fluid intake, especially water. One of the test from liver or bone is above normal. This may be consistent with the chronic foot pain and arthritis. Most of liver tests are normal. Vitamin D level is almost normal. I refilled the Vitamin D supplement; take it once a week through the winter. Thyroid test is normal.  Copy to pt. ______

## 2013-04-20 ENCOUNTER — Other Ambulatory Visit: Payer: Self-pay | Admitting: Physician Assistant

## 2013-05-05 ENCOUNTER — Other Ambulatory Visit: Payer: Self-pay

## 2013-05-05 DIAGNOSIS — M79674 Pain in right toe(s): Secondary | ICD-10-CM

## 2013-05-05 NOTE — Telephone Encounter (Signed)
Pended please advise.  

## 2013-05-05 NOTE — Telephone Encounter (Signed)
Patients daughter Jon GillsVeronica Summers was told to call and ask for her mother to get a refill on her hydrocodone. Patient see's Dr. Audria NineMcpherson. Please advise when ready for pick up.    [phone]

## 2013-05-06 MED ORDER — HYDROCODONE-ACETAMINOPHEN 5-325 MG PO TABS: ORAL_TABLET | ORAL | Status: DC

## 2013-05-06 NOTE — Telephone Encounter (Signed)
HC-APAP 5-325 mg prescription printed and ready for pick-up at [facility] on 05/07/2013.

## 2013-05-26 ENCOUNTER — Telehealth: Payer: Self-pay

## 2013-05-26 NOTE — Telephone Encounter (Signed)
Refill HYDROcodone-acetaminophen (NORCO/VICODIN) 5-325 MG per tablet   [phone]

## 2013-05-27 MED ORDER — HYDROCODONE-ACETAMINOPHEN 5-325 MG PO TABS: ORAL_TABLET | ORAL | Status: DC

## 2013-05-27 NOTE — Telephone Encounter (Signed)
Hydrocodone- APAP RX will need to be picked up at [facility].

## 2013-05-28 NOTE — Telephone Encounter (Signed)
Pt advised.

## 2013-06-23 ENCOUNTER — Telehealth: Payer: Self-pay

## 2013-06-23 NOTE — Telephone Encounter (Signed)
Pt is needing a refill on hydrocodonine   [facility] number [phone]

## 2013-06-25 MED ORDER — HYDROCODONE-ACETAMINOPHEN 5-325 MG PO TABS: ORAL_TABLET | ORAL | Status: DC

## 2013-06-25 NOTE — Telephone Encounter (Signed)
Patient was notified that rx was ready for pick up.

## 2013-06-25 NOTE — Telephone Encounter (Signed)
Contact pt/ family member that HC-APAP can be picked up at [facility].

## 2013-07-08 ENCOUNTER — Ambulatory Visit: Payer: Medicare Other | Admitting: Family Medicine

## 2013-07-24 ENCOUNTER — Other Ambulatory Visit: Payer: Self-pay

## 2013-07-24 MED ORDER — HYDROCODONE-ACETAMINOPHEN 5-325 MG PO TABS: ORAL_TABLET | ORAL | Status: DC

## 2013-07-24 NOTE — Telephone Encounter (Signed)
Pts daughter is requesting a refill on hydrocodone for pt. Best# [phone]

## 2013-07-24 NOTE — Telephone Encounter (Signed)
HC- APAP prescription will be at 1-2 for pick-up (needs to be signed for) this afternoon. Notify daughter.

## 2013-07-25 NOTE — Telephone Encounter (Signed)
Called daughter to notify ready.

## 2013-07-31 ENCOUNTER — Encounter: Payer: Self-pay | Admitting: Family Medicine

## 2013-07-31 ENCOUNTER — Ambulatory Visit (INDEPENDENT_AMBULATORY_CARE_PROVIDER_SITE_OTHER): Payer: Medicare Other | Admitting: Family Medicine

## 2013-07-31 VITALS — BP 158/80 | HR 71 | Temp 98.9°F | Resp 16

## 2013-07-31 DIAGNOSIS — B351 Tinea unguium: Secondary | ICD-10-CM

## 2013-07-31 DIAGNOSIS — R609 Edema, unspecified: Secondary | ICD-10-CM | POA: Diagnosis not present

## 2013-07-31 DIAGNOSIS — M79609 Pain in unspecified limb: Secondary | ICD-10-CM

## 2013-07-31 DIAGNOSIS — I10 Essential (primary) hypertension: Secondary | ICD-10-CM | POA: Diagnosis not present

## 2013-07-31 DIAGNOSIS — R202 Paresthesia of skin: Secondary | ICD-10-CM

## 2013-07-31 DIAGNOSIS — R600 Localized edema: Secondary | ICD-10-CM

## 2013-07-31 LAB — BASIC METABOLIC PANEL
BUN: 18 mg/dL (ref 6–23)
CALCIUM: 9.4 mg/dL (ref 8.4–10.5)
CO2: 30 mEq/L (ref 19–32)
CREATININE: 1.09 mg/dL (ref 0.50–1.10)
Chloride: 102 mEq/L (ref 96–112)
GLUCOSE: 80 mg/dL (ref 70–99)
POTASSIUM: 3.7 meq/L (ref 3.5–5.3)
Sodium: 142 mEq/L (ref 135–145)

## 2013-07-31 NOTE — Patient Instructions (Signed)
Contact the [facility] that you were seen at [facility] [address] in the past. The mails need to be trimmed and that specialist can decide if medication is needed for the nail problem.   Continue all current medications; contact the clinic if you have any concerns or need to be seen before next scheduled appointment.

## 2013-08-01 NOTE — Progress Notes (Signed)
Quick Note:  Please advise pt regarding following labs... Labs results are normal. Kidney function is good.  Increase potassium-rich foods like lentils and beans, fruits and green leafy vegetables. Gatorade is a good source of potassium; have a small glass of this drink every day.  Copy to pt. ______

## 2013-08-02 ENCOUNTER — Encounter: Payer: Self-pay | Admitting: *Deleted

## 2013-08-02 NOTE — Progress Notes (Signed)
S:  This 79 y.o. AA female is accompanied by her daughter/caretaker. Pt has stable HTN and is compliant w/ medication w/o adverse effects. Pt is sedentary, spending most of day in wheelchair due to April 2014 stroke. Associated w/ this lack of activity is chronic R leg edema. Daughter reports that it is unchanged. Chronic R -sided paresthesia involving face and upper extremity since stroke is stable. Pt has good appetite and is eating healthier, according to daughter. She continues to have mild confusion and memory deficits.  Pt and daughter concerned about nails on R foot, especially great toenail. No report of trauma. The nail is thickened and is discolored and tender at base of nail. Pt is established w/ a podiatrist; daughter will contact that practice and schedule appt for pt.  Patient Active Problem List   Diagnosis Date Noted  . Paresthesia and pain of right extremity 04/09/2013  . CVA (cerebral vascular accident) 08/31/2012  . Unspecified hypothyroidism 08/31/2012  . Partial seizure 08/29/2012  . GERD (gastroesophageal reflux disease) 05/30/2011  . HTN (hypertension) 05/30/2011  . Cerebrovascular disease, arteriosclerotic, post-stroke 05/13/2010   Prior to Admission medications   Medication Sig Start Date End Date Taking? Authorizing Provider  amLODipine (NORVASC) 10 MG tablet Take 1 tablet (10 mg total) by mouth daily. 04/08/13  Yes Maurice MarchBarbara B Maritta Kief, MD  atorvastatin (LIPITOR) 80 MG tablet Take 1 tablet (80 mg total) by mouth daily at 6 PM. 08/30/12  Yes Glori LuisEric G Sonnenberg, MD  carvedilol (COREG) 6.25 MG tablet TAKE 1 TABLET (6.25 MG TOTAL) BY MOUTH 2 (TWO) TIMES DAILY WITH A MEAL. 04/08/13  Yes Maurice MarchBarbara B Carlyle Mcelrath, MD  clopidogrel (PLAVIX) 75 MG tablet Take 1 tablet (75 mg total) by mouth daily with breakfast. 08/30/12  Yes Glori LuisEric G Sonnenberg, MD  HYDROcodone-acetaminophen (NORCO/VICODIN) 5-325 MG per tablet Take 1/2 - 1 tablet every 8 hours as needed for pain. 07/24/13  Yes Maurice MarchBarbara B  Nadira Single, MD  levETIRAcetam (KEPPRA) 500 MG tablet TAKE 1 TABLET (500 MG TOTAL) BY MOUTH EVERY 12 (TWELVE) HOURS. 04/08/13  Yes Maurice MarchBarbara B Dominyck Reser, MD  levothyroxine (SYNTHROID, LEVOTHROID) 25 MCG tablet Take 1 tablet (25 mcg total) by mouth daily before breakfast. 04/08/13  Yes Maurice MarchBarbara B Arsenia Goracke, MD  ranitidine (ZANTAC) 150 MG capsule Take 1 capsule (150 mg total) by mouth daily. 05/30/11  Yes Maurice MarchBarbara B Raissa Dam, MD  Vitamin D, Ergocalciferol, (DRISDOL) 50000 UNITS CAPS capsule Take 1 capsule (50,000 Units total) by mouth every 7 (seven) days. 04/08/13  Yes Maurice MarchBarbara B Aston Lieske, MD   PMHx, Surg Hx, Soc and Fam Hx reviewed.  ROS; As per HPI; negative for diaphoresis, abnormal weight change, vision disturbances, CP or tightness, palpitations, SOB or DOE, cough, apnea, abd pain, n/v/d/constipation, HA, dizziness, new numbness or weakness, tremor or syncope. No agitation, behavior changes, mood swings or sleep disturbance.  O: Filed Vitals:   07/31/13 1133  BP: 158/80  Pulse: 71  Temp: 98.9 F (37.2 C)  Resp: 16   GEN: In NAD; seated in WC. HENT: [place]/AT; EOMI w/ clear conj and muddy sclerae. Dentition- dentures. EACs/nares/oroph clear; moist mucosa. NECK: Supple. COR: RRR. Normal S1 and S2. LUNGS: CTA; no wheezes or rales. MS: R lower ext- 2+ edema; no calf tenderness. L lower leg normal. R foot- great toenail- thick and discolored; base of nail hyperpigmented and slightly swollen/tender. NEURO: A&O to person and place. CNs intact. Speech mildly dysarthric. R upper ext w/ contracture at elbow and wrist/digits. Pt can raise R arm against gravity to  just below shoulder level.  A/P: HTN (hypertension) - Stable; continue current medications. Plan: Basic metabolic panel  Onychomycosis- Daughter to sch pt w/ Podiatry.  Paresthesia and pain of right extremity- CVA residua.  Leg edema, right- Encouraged elevation during day. Continue wearing compression hose.

## 2013-08-06 ENCOUNTER — Other Ambulatory Visit (HOSPITAL_COMMUNITY): Payer: Self-pay | Admitting: Family Medicine

## 2013-08-07 ENCOUNTER — Other Ambulatory Visit (HOSPITAL_COMMUNITY): Payer: Self-pay | Admitting: Family Medicine

## 2013-08-07 NOTE — Telephone Encounter (Signed)
Dr Audria NineMcPherson, I'm not sure that you have Rxd these for pt before. I don't see any record, but may have been Rxd by hospitalist.

## 2013-08-10 NOTE — Telephone Encounter (Signed)
Atorvastatin and Clopidogrel both authorized for 90 days w/ 1 refill.

## 2013-08-21 ENCOUNTER — Telehealth: Payer: Self-pay

## 2013-08-21 MED ORDER — HYDROCODONE-ACETAMINOPHEN 5-325 MG PO TABS: ORAL_TABLET | ORAL | Status: DC

## 2013-08-21 NOTE — Telephone Encounter (Signed)
I have printed 2 prescriptions for Hydrocodone- APAP. One RX can be filled now and one of the RX has note to pharmacy- "May be filled on or after Sep 22, 2013".  RXs at 102 tomorrow morning and require signature.

## 2013-08-21 NOTE — Telephone Encounter (Signed)
Long story regarding enrolling her meds with insurance.  Too expensive to pay out of pocket for meds. Needs help in getting scripts covered with insurance.  Dr. Audria NineMcPherson   [phone]

## 2013-08-21 NOTE — Telephone Encounter (Signed)
Pt needs a refill on her Hydrocodone.  Pt has cleared up the insurance problem. Her medication coverage has lapsed. Her daughter is helping her at this time until it is reinstated.

## 2013-08-21 NOTE — Telephone Encounter (Signed)
Spoke to the pharmacy- she has not brought her medication insurance card to them.

## 2013-10-07 ENCOUNTER — Telehealth: Payer: Self-pay

## 2013-10-07 NOTE — Telephone Encounter (Signed)
PT IN NEED OF HER HYDROCODONE. PLEASE CALL [phone] WHEN READY FOR PICK UP

## 2013-10-08 MED ORDER — HYDROCODONE-ACETAMINOPHEN 5-325 MG PO TABS: ORAL_TABLET | ORAL | Status: DC

## 2013-10-08 NOTE — Telephone Encounter (Signed)
Hydrocodone- APAP prescription printed at [facility] and can be picked up from [facility] [address]. Signature required.

## 2013-10-09 NOTE — Telephone Encounter (Signed)
Spoke with patient's daughter and notified her that RX was ready for pickup at [address].

## 2013-10-10 ENCOUNTER — Other Ambulatory Visit: Payer: Self-pay

## 2013-10-10 MED ORDER — LEVOTHYROXINE SODIUM 25 MCG PO TABS: 25.0000 ug | ORAL_TABLET | Freq: Every day | ORAL | Status: DC

## 2013-10-10 MED ORDER — CARVEDILOL 6.25 MG PO TABS: ORAL_TABLET | ORAL | Status: DC

## 2013-10-10 MED ORDER — LEVETIRACETAM 500 MG PO TABS: ORAL_TABLET | ORAL | Status: DC

## 2013-10-10 MED ORDER — AMLODIPINE BESYLATE 10 MG PO TABS: 10.0000 mg | ORAL_TABLET | Freq: Every day | ORAL | Status: DC

## 2013-10-28 ENCOUNTER — Telehealth: Payer: Self-pay

## 2013-10-28 NOTE — Telephone Encounter (Signed)
Carla Manning STATES HER MOM IN NEED OF HER HYDOCODONE 5-325 MGS. PLEASE CALL [phone]

## 2013-10-30 MED ORDER — HYDROCODONE-ACETAMINOPHEN 5-325 MG PO TABS: ORAL_TABLET | ORAL | Status: DC

## 2013-10-30 NOTE — Telephone Encounter (Signed)
Hydrocodone-APAP prescription is ready for pick-up at 104. This quantity needs to last pt 30 days.  Signature is required.

## 2013-10-31 NOTE — Telephone Encounter (Signed)
Left a message for patient to return call for patient to pick up prescription at 104.

## 2013-11-21 NOTE — Telephone Encounter (Signed)
Lm this medication is not able to be electronically sent. Pt must come and pick up script to bring to the pharmacy.

## 2013-11-21 NOTE — Telephone Encounter (Signed)
PATIENT'S DAUGHTER IS REQUESTING A REFILL FOR HYDROCODONE SENT TO [facility]

## 2013-11-28 ENCOUNTER — Telehealth: Payer: Self-pay

## 2013-11-28 MED ORDER — HYDROCODONE-ACETAMINOPHEN 5-325 MG PO TABS: ORAL_TABLET | ORAL | Status: DC

## 2013-11-28 NOTE — Telephone Encounter (Signed)
Medication was picked up on 10/31/13 from [facility]. Spoke to daughter- she states she did not pick it up.

## 2013-11-28 NOTE — Telephone Encounter (Signed)
Hydrocodone - APAP prescription will be printed at 104 on Saturday; printer is out of paper as of Friday evening.

## 2013-11-28 NOTE — Telephone Encounter (Signed)
Pt called to see if medication was still ready for pick up  at 104

## 2013-11-28 NOTE — Telephone Encounter (Signed)
Cancel previous messages- the prescription for 7/3 was picked up. Pt is out of medication from this refill.  Pt is needing a refill on her Hydrocodone.

## 2013-11-29 ENCOUNTER — Other Ambulatory Visit: Payer: Self-pay | Admitting: Family Medicine

## 2013-11-29 MED ORDER — HYDROCODONE-ACETAMINOPHEN 5-325 MG PO TABS: ORAL_TABLET | ORAL | Status: DC

## 2013-11-29 NOTE — Progress Notes (Signed)
lmom that rx is ready for pickup.  

## 2013-12-01 NOTE — Telephone Encounter (Signed)
Lm script would be available at 104 today.

## 2013-12-02 ENCOUNTER — Ambulatory Visit: Payer: Medicare Other | Admitting: Family Medicine

## 2013-12-11 ENCOUNTER — Telehealth: Payer: Self-pay

## 2013-12-12 ENCOUNTER — Telehealth: Payer: Self-pay

## 2013-12-12 MED ORDER — LEVETIRACETAM 500 MG PO TABS: ORAL_TABLET | ORAL | Status: DC

## 2013-12-12 MED ORDER — CARVEDILOL 6.25 MG PO TABS: ORAL_TABLET | ORAL | Status: DC

## 2013-12-12 MED ORDER — AMLODIPINE BESYLATE 10 MG PO TABS: 10.0000 mg | ORAL_TABLET | Freq: Every day | ORAL | Status: DC

## 2013-12-12 MED ORDER — LEVOTHYROXINE SODIUM 25 MCG PO TABS: 25.0000 ug | ORAL_TABLET | Freq: Every day | ORAL | Status: DC

## 2013-12-12 NOTE — Telephone Encounter (Signed)
Refills sent per protocol

## 2013-12-12 NOTE — Telephone Encounter (Signed)
[facility] called and said they sent the rx's electronically and there was no response and then they faxed for a RF and we faxed it back saying send electronically. Unable to find anything in EPIC. Pt needs a RF of Keppra, Levothyroxine, Coreg and Norvasc.

## 2013-12-22 ENCOUNTER — Telehealth: Payer: Self-pay | Admitting: Family Medicine

## 2013-12-22 NOTE — Telephone Encounter (Addendum)
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 States that she has not gotten a phone call notifying her that the paperwork is ready and now she is approaching the deadline. When asked if she brought her forms to 102 or 4 (patient sees Dr. Audria Nine) she stated that she originally came to 14 but was instructed to go to 104 to drop off paperwork and pay her $15.00 fee.

## 2013-12-23 NOTE — Telephone Encounter (Signed)
Checked with 104- no FMLA paperwork is there. The paperwork is for [name]- pt daughter. It will have her name on the paperwork not Lawernce Pitts.

## 2013-12-23 NOTE — Telephone Encounter (Signed)
[facility] found the paperwork- it was under [name] name. Dr. Audria Nine was out of the office last week and staff was unsure why [name] needed time off. This will be taken care of and sent back to Disabilities as soon as it is completed.

## 2013-12-25 ENCOUNTER — Telehealth: Payer: Self-pay

## 2013-12-25 NOTE — Telephone Encounter (Signed)
Patient daughter [name] would like to request refill for hydrocodone. Pharmacy is still [facility], please call when ready.

## 2013-12-28 MED ORDER — HYDROCODONE-ACETAMINOPHEN 5-325 MG PO TABS: ORAL_TABLET | ORAL | Status: DC

## 2013-12-28 NOTE — Telephone Encounter (Signed)
Prescription will be ready for pick-up at [address] on Monday afternoon, 12/29/2013.  Signature required. Please advise that appointment necessary for continued refills. (tried to print prescription but printer malfunctioning; will try to print RX on Monday)

## 2013-12-29 ENCOUNTER — Encounter: Payer: Self-pay | Admitting: Family Medicine

## 2013-12-29 ENCOUNTER — Other Ambulatory Visit: Payer: Self-pay | Admitting: Radiology

## 2013-12-29 NOTE — Telephone Encounter (Signed)
Called to advise Rx for hydrocodone is ready for pick up at 104 spoke to daughter [place].

## 2013-12-31 ENCOUNTER — Ambulatory Visit (INDEPENDENT_AMBULATORY_CARE_PROVIDER_SITE_OTHER): Payer: Medicare Other | Admitting: Family Medicine

## 2013-12-31 ENCOUNTER — Encounter: Payer: Self-pay | Admitting: Family Medicine

## 2013-12-31 VITALS — BP 140/70 | HR 63 | Temp 98.7°F | Resp 16

## 2013-12-31 DIAGNOSIS — E039 Hypothyroidism, unspecified: Secondary | ICD-10-CM

## 2013-12-31 DIAGNOSIS — R7309 Other abnormal glucose: Secondary | ICD-10-CM | POA: Diagnosis not present

## 2013-12-31 DIAGNOSIS — R569 Unspecified convulsions: Secondary | ICD-10-CM

## 2013-12-31 DIAGNOSIS — R739 Hyperglycemia, unspecified: Secondary | ICD-10-CM

## 2013-12-31 LAB — POCT GLYCOSYLATED HEMOGLOBIN (HGB A1C): Hemoglobin A1C: 5.9

## 2013-12-31 MED ORDER — HYDROCODONE-ACETAMINOPHEN 5-325 MG PO TABS: ORAL_TABLET | ORAL | Status: DC

## 2013-12-31 NOTE — Patient Instructions (Addendum)
I think that the symptoms that you are having are a type of seizure; you first started having strokes in 2012, a full year before you first saw me.  At your first visit with me in 2013, you were complaining of this feeling of being hot. You may have been having seizures then, Seizures in the elderly often present different than in younger patients of people who have seizures for different reasons. You have had multiple strokes which could explain the worsening symptoms that you are having.    I am checking the drug level of your seizure medication because the dose may have to be increased. I am not referring you to an endocrine specialist at this time. I may need to have you go back to the neurologist to see if these episodes are indeed a type of seizure.  I will contact you about the result and we will discuss if Keppra needs to be increased or if referral to specialist is appropriate.  Your A1c result shows that you have some mild impairment of sugar metabolism.

## 2014-01-01 LAB — CBC WITH DIFFERENTIAL/PLATELET
Basophils Absolute: 0 10*3/uL (ref 0.0–0.1)
Basophils Relative: 0 % (ref 0–1)
EOS PCT: 1 % (ref 0–5)
Eosinophils Absolute: 0.1 10*3/uL (ref 0.0–0.7)
HCT: 38.6 % (ref 36.0–46.0)
Hemoglobin: 13 g/dL (ref 12.0–15.0)
LYMPHS ABS: 1.7 10*3/uL (ref 0.7–4.0)
Lymphocytes Relative: 27 % (ref 12–46)
MCH: 26.6 pg (ref 26.0–34.0)
MCHC: 33.7 g/dL (ref 30.0–36.0)
MCV: 78.9 fL (ref 78.0–100.0)
Monocytes Absolute: 0.3 10*3/uL (ref 0.1–1.0)
Monocytes Relative: 5 % (ref 3–12)
Neutro Abs: 4.3 10*3/uL (ref 1.7–7.7)
Neutrophils Relative %: 67 % (ref 43–77)
Platelets: 176 10*3/uL (ref 150–400)
RBC: 4.89 MIL/uL (ref 3.87–5.11)
RDW: 16.7 % — ABNORMAL HIGH (ref 11.5–15.5)
WBC: 6.4 10*3/uL (ref 4.0–10.5)

## 2014-01-01 LAB — THYROID PANEL WITH TSH
Free Thyroxine Index: 1.7 (ref 1.4–3.8)
T3 Uptake: 30 % (ref 22.0–35.0)
T4 TOTAL: 5.8 ug/dL (ref 4.5–12.0)
TSH: 2.862 u[IU]/mL (ref 0.350–4.500)

## 2014-01-01 NOTE — Progress Notes (Signed)
Subjective:    Patient ID: Carla Manning, female    DOB: [birth year], 79 y.o.   MRN: [id number]  HPI  This 79 y.o. AA female returns with her daughter, [name], and son, Jillyn Hidden; pt continues to have episodes of uncomfortable heat involving R side of body. The daughter has a video showing the pt experiencing one of these episodes. The feeling of intense heat starts in the R hand then involves the arm and entire R side of body. She c/o epig discomfort during these episodes. The pt often utters a phrase repeatedly, does not seem to be aware of surroundings and appears restless and very agitated. The episodes occur in a cyclic pattern- 2 AM and late morning.  The pt has cerebrovascular disease and is s/p 3 CVAs (1st one in 2012). Pt was having theses episodes prior to her 1st encounter w/ me in 2013, one year after 1st stroke. (Her brain imaging shows several strokes of R hemisphere, in MCA distribution). She had her first grand mal seizure in 2014 after a large stroke; daughter reports staring episodes dating back several years. She is taking all meds as prescribed.   Patient Active Problem List   Diagnosis Date Noted  . Paresthesia and pain of right extremity 04/09/2013  . CVA (cerebral vascular accident) 08/31/2012  . Unspecified hypothyroidism 08/31/2012  . Partial seizure 08/29/2012  . GERD (gastroesophageal reflux disease) 05/30/2011  . HTN (hypertension) 05/30/2011  . Cerebrovascular disease, arteriosclerotic, post-stroke 05/13/2010    Prior to Admission medications   Medication Sig Start Date End Date Taking? Authorizing Provider  amLODipine (NORVASC) 10 MG tablet Take 1 tablet (10 mg total) by mouth daily. 12/12/13  Yes Chelle S Jeffery, PA-C  atorvastatin (LIPITOR) 80 MG tablet TAKE 1 TABLET EVERY DAY AT 6 P.M   Yes Maurice March, MD  carvedilol (COREG) 6.25 MG tablet TAKE 1 TABLET (6.25 MG TOTAL) BY MOUTH 2 (TWO) TIMES DAILY WITH A MEAL. 12/12/13  Yes Chelle S Jeffery, PA-C    clopidogrel (PLAVIX) 75 MG tablet TAKE 1 TABLET (75 MG TOTAL) BY MOUTH DAILY WITH BREAKFAST.   Yes Maurice March, MD  HYDROcodone-acetaminophen (NORCO/VICODIN) 5-325 MG per tablet Take 1/2 - 1 tablet every 8 hours as needed for pain.   Yes Maurice March, MD  levETIRAcetam (KEPPRA) 500 MG tablet TAKE 1 TABLET (500 MG TOTAL) BY MOUTH EVERY 12 (TWELVE) HOURS. 12/12/13  Yes Chelle S Jeffery, PA-C  levothyroxine (SYNTHROID, LEVOTHROID) 25 MCG tablet Take 1 tablet (25 mcg total) by mouth daily before breakfast. 12/12/13  Yes Chelle S Jeffery, PA-C  ranitidine (ZANTAC) 150 MG capsule Take 1 capsule (150 mg total) by mouth daily. 05/30/11  Yes Maurice March, MD  Vitamin D, Ergocalciferol, (DRISDOL) 50000 UNITS CAPS capsule Take 1 capsule (50,000 Units total) by mouth every 7 (seven) days. 04/08/13  Yes Maurice March, MD    History   Social History  . Marital Status: Divorced    Spouse Name: N/A    Number of Children: N/A  . Years of Education: N/A   Occupational History  . Retired    Social History Main Topics  . Smoking status: Never Smoker   . Smokeless tobacco: Never Used  . Alcohol Use: No  . Drug Use: No  . Sexual Activity: No   Other Topics Concern  . Not on file   Social History Narrative   Divorced. Education: Grade School.    Family History  Problem Relation Age of  Onset  . Diabetes Mother   . Hypertension Daughter     Review of Systems  Constitutional: Negative.   HENT: Negative.   Eyes: Negative.   Respiratory: Negative.   Cardiovascular: Negative.   Gastrointestinal: Positive for abdominal pain. Negative for nausea, vomiting and diarrhea.  Endocrine: Negative.   Skin: Positive for rash.       Rash under both breasts (looks like a heat rash according to daughter). Relief w/ topical cream.  Neurological: Negative for syncope, weakness and headaches.  Psychiatric/Behavioral: Positive for confusion. Negative for hallucinations and behavioral  problems. The patient is not nervous/anxious.       Objective:   Physical Exam  Nursing note and vitals reviewed. Constitutional: She is oriented to person, place, and time. She appears well-developed and well-nourished. No distress.  Seated in WC.  HENT:  Head: Normocephalic.  Right Ear: External ear normal.  Left Ear: External ear normal.  Nose: Nose normal.  Mouth/Throat: Oropharynx is clear and moist.  Eyes: EOM are normal. No scleral icterus.  Neck: Neck supple.  Cardiovascular: Normal rate and regular rhythm.   Pulmonary/Chest: Effort normal. No respiratory distress.  Musculoskeletal: She exhibits no edema.  Neurological: She is alert and oriented to person, place, and time. A sensory deficit is present. She exhibits abnormal muscle tone.  Moderate dysarthria. Minimal facial asymmetry.  R upper ext spacticity w/ flexion contracture. Chronic R hemiparesis.  Skin: Skin is warm, dry and intact. Rash noted. She is not diaphoretic.  Erythematous papules under in breast folds.  Psychiatric: Her speech is delayed. She is slowed. Cognition and memory are impaired.  Affect is slightly flat. Speech is slowed. She is inattentive.    Results for orders placed in visit on 12/31/13  POCT GLYCOSYLATED HEMOGLOBIN (HGB A1C)      Result Value Ref Range   Hemoglobin A1C 5.9        Assessment & Plan:  Hyperglycemia - Nutrition guidelines briefly discussed. Daughter reports pt eats applesauce and bananas as only fruit sources; otherwise her diet is diverse and she drinks mostly water. Plan: POCT glycosylated hemoglobin (Hb A1C), CBC with Differential  Partial seizure - I suspect that the "episodes" are seizures; pt will be referred back to neurologist if warranted. Plan: Levetiracetam level  Unspecified hypothyroidism - Plan: Thyroid Panel With TSH   Meds ordered this encounter  Medications  . HYDROcodone-acetaminophen (NORCO/VICODIN) 5-325 MG per tablet    Sig: Take 1/2 - 1 tablet  every 8 hours as needed for pain.    Dispense:  60 tablet    Refill:  0

## 2014-01-03 LAB — LEVETIRACETAM LEVEL: Keppra (Levetiracetam): 22.7 ug/mL

## 2014-01-04 ENCOUNTER — Other Ambulatory Visit: Payer: Self-pay | Admitting: Family Medicine

## 2014-01-04 DIAGNOSIS — M79609 Pain in unspecified limb: Secondary | ICD-10-CM

## 2014-01-04 DIAGNOSIS — I639 Cerebral infarction, unspecified: Secondary | ICD-10-CM

## 2014-01-04 DIAGNOSIS — R569 Unspecified convulsions: Secondary | ICD-10-CM

## 2014-01-04 DIAGNOSIS — R202 Paresthesia of skin: Secondary | ICD-10-CM

## 2014-01-04 NOTE — Progress Notes (Signed)
Quick Note:  Please advise pt regarding following labs... All lab results are normal. The KEPPRA (seizure medication)drug level is in the range given the dose she is taking. I will refer pt back to NEUROLOGY to evaluate for possible seizures (even on anti-seizure medication).  I discussed this with pt's daughter and son at last visit.  Copy ot labs to pt. ______

## 2014-01-06 ENCOUNTER — Encounter: Payer: Self-pay | Admitting: Radiology

## 2014-01-07 NOTE — Telephone Encounter (Signed)
No msg °

## 2014-01-09 ENCOUNTER — Other Ambulatory Visit: Payer: Self-pay | Admitting: Physician Assistant

## 2014-01-14 ENCOUNTER — Other Ambulatory Visit: Payer: Self-pay

## 2014-01-14 MED ORDER — ATORVASTATIN CALCIUM 80 MG PO TABS: ORAL_TABLET | ORAL | Status: DC

## 2014-01-14 NOTE — Telephone Encounter (Signed)
Pharm reqs RFs of atorvastatin. Dr Audria Nine, you just saw pt but this med not discussed. Do you want to give RFs? Last lipid test 08/2012.

## 2014-01-28 ENCOUNTER — Telehealth: Payer: Self-pay

## 2014-01-28 NOTE — Telephone Encounter (Signed)
Patient says they requested a wheelchair through [employer] and has not heard anything from us about the paperwork

## 2014-01-29 NOTE — Telephone Encounter (Signed)
I recall completing ppw for WC but do not see it scanned into EPIC; please send again. Thanks.

## 2014-01-29 NOTE — Telephone Encounter (Signed)
Dr. Audria NineMcPherson did you receive paperwork in regards to ordering a wheel chair?  Please advise if you need me to send these again

## 2014-01-30 ENCOUNTER — Telehealth: Payer: Self-pay

## 2014-01-30 NOTE — Telephone Encounter (Signed)
Pt is requesting a refill of hydrocodone please.

## 2014-01-31 MED ORDER — HYDROCODONE-ACETAMINOPHEN 5-325 MG PO TABS: ORAL_TABLET | ORAL | Status: DC

## 2014-01-31 NOTE — Telephone Encounter (Signed)
Hydrocodone prescription printed at 104 on Saturday. I will leave it on the desk at check-in at 104; someone can some down from 102 on Sunday and pick it up. Contact pt to pick up RX from 102 on Sunday afternoon. Thank you.

## 2014-02-01 NOTE — Telephone Encounter (Signed)
Pt notified that rx is ready for pick up

## 2014-02-10 ENCOUNTER — Emergency Department (HOSPITAL_COMMUNITY): Payer: Medicare Other

## 2014-02-10 ENCOUNTER — Emergency Department (HOSPITAL_COMMUNITY)
Admission: EM | Admit: 2014-02-10 | Discharge: 2014-02-10 | Disposition: A | Discharge status: Home / Self Care | Payer: Medicare Other | Attending: Emergency Medicine | Admitting: Emergency Medicine

## 2014-02-10 ENCOUNTER — Encounter (HOSPITAL_COMMUNITY): Payer: Self-pay | Admitting: Emergency Medicine

## 2014-02-10 DIAGNOSIS — E039 Hypothyroidism, unspecified: Secondary | ICD-10-CM | POA: Insufficient documentation

## 2014-02-10 DIAGNOSIS — G8101 Flaccid hemiplegia affecting right dominant side: Secondary | ICD-10-CM | POA: Diagnosis not present

## 2014-02-10 DIAGNOSIS — G40909 Epilepsy, unspecified, not intractable, without status epilepticus: Secondary | ICD-10-CM | POA: Diagnosis not present

## 2014-02-10 DIAGNOSIS — R350 Frequency of micturition: Secondary | ICD-10-CM

## 2014-02-10 DIAGNOSIS — R531 Weakness: Secondary | ICD-10-CM | POA: Diagnosis not present

## 2014-02-10 DIAGNOSIS — G8929 Other chronic pain: Secondary | ICD-10-CM | POA: Insufficient documentation

## 2014-02-10 DIAGNOSIS — I6789 Other cerebrovascular disease: Secondary | ICD-10-CM | POA: Diagnosis not present

## 2014-02-10 DIAGNOSIS — R600 Localized edema: Secondary | ICD-10-CM | POA: Insufficient documentation

## 2014-02-10 DIAGNOSIS — Z8673 Personal history of transient ischemic attack (TIA), and cerebral infarction without residual deficits: Secondary | ICD-10-CM | POA: Diagnosis not present

## 2014-02-10 DIAGNOSIS — I10 Essential (primary) hypertension: Secondary | ICD-10-CM | POA: Insufficient documentation

## 2014-02-10 DIAGNOSIS — F0390 Unspecified dementia without behavioral disturbance: Secondary | ICD-10-CM | POA: Diagnosis not present

## 2014-02-10 DIAGNOSIS — R300 Dysuria: Secondary | ICD-10-CM | POA: Diagnosis present

## 2014-02-10 DIAGNOSIS — Z791 Long term (current) use of non-steroidal anti-inflammatories (NSAID): Secondary | ICD-10-CM | POA: Insufficient documentation

## 2014-02-10 DIAGNOSIS — R4182 Altered mental status, unspecified: Secondary | ICD-10-CM | POA: Diagnosis not present

## 2014-02-10 DIAGNOSIS — Z79899 Other long term (current) drug therapy: Secondary | ICD-10-CM | POA: Insufficient documentation

## 2014-02-10 DIAGNOSIS — R269 Unspecified abnormalities of gait and mobility: Secondary | ICD-10-CM | POA: Diagnosis not present

## 2014-02-10 DIAGNOSIS — R52 Pain, unspecified: Secondary | ICD-10-CM

## 2014-02-10 DIAGNOSIS — R51 Headache: Secondary | ICD-10-CM | POA: Diagnosis not present

## 2014-02-10 LAB — URINALYSIS, ROUTINE W REFLEX MICROSCOPIC
BILIRUBIN URINE: NEGATIVE
Glucose, UA: NEGATIVE mg/dL
Hgb urine dipstick: NEGATIVE
KETONES UR: NEGATIVE mg/dL
NITRITE: NEGATIVE
PROTEIN: NEGATIVE mg/dL
Specific Gravity, Urine: 1.007 (ref 1.005–1.030)
UROBILINOGEN UA: 0.2 mg/dL (ref 0.0–1.0)
pH: 7 (ref 5.0–8.0)

## 2014-02-10 LAB — CBC WITH DIFFERENTIAL/PLATELET
Basophils Absolute: 0 10*3/uL (ref 0.0–0.1)
Basophils Relative: 0 % (ref 0–1)
Eosinophils Absolute: 0.1 10*3/uL (ref 0.0–0.7)
Eosinophils Relative: 1 % (ref 0–5)
HCT: 36.8 % (ref 36.0–46.0)
Hemoglobin: 13 g/dL (ref 12.0–15.0)
LYMPHS ABS: 2.1 10*3/uL (ref 0.7–4.0)
LYMPHS PCT: 31 % (ref 12–46)
MCH: 27.4 pg (ref 26.0–34.0)
MCHC: 35.3 g/dL (ref 30.0–36.0)
MCV: 77.6 fL — AB (ref 78.0–100.0)
Monocytes Absolute: 0.4 10*3/uL (ref 0.1–1.0)
Monocytes Relative: 6 % (ref 3–12)
Neutro Abs: 4.2 10*3/uL (ref 1.7–7.7)
Neutrophils Relative %: 62 % (ref 43–77)
Platelets: 174 10*3/uL (ref 150–400)
RBC: 4.74 MIL/uL (ref 3.87–5.11)
RDW: 14.9 % (ref 11.5–15.5)
WBC: 6.8 10*3/uL (ref 4.0–10.5)

## 2014-02-10 LAB — COMPREHENSIVE METABOLIC PANEL
ALK PHOS: 164 U/L — AB (ref 39–117)
ALT: 9 U/L (ref 0–35)
AST: 20 U/L (ref 0–37)
Albumin: 3.8 g/dL (ref 3.5–5.2)
Anion gap: 13 (ref 5–15)
BUN: 15 mg/dL (ref 6–23)
CO2: 26 mEq/L (ref 19–32)
Calcium: 9.4 mg/dL (ref 8.4–10.5)
Chloride: 103 mEq/L (ref 96–112)
Creatinine, Ser: 1 mg/dL (ref 0.50–1.10)
GFR, EST AFRICAN AMERICAN: 61 mL/min — AB (ref >90)
GFR, EST NON AFRICAN AMERICAN: 53 mL/min — AB (ref >90)
GLUCOSE: 118 mg/dL — AB (ref 70–99)
POTASSIUM: 3.8 meq/L (ref 3.7–5.3)
Sodium: 142 mEq/L (ref 137–147)
Total Bilirubin: 0.3 mg/dL (ref 0.3–1.2)
Total Protein: 8.2 g/dL (ref 6.0–8.3)

## 2014-02-10 LAB — URINE MICROSCOPIC-ADD ON

## 2014-02-10 MED ORDER — GABAPENTIN 100 MG PO CAPS: 100.0000 mg | ORAL_CAPSULE | Freq: Two times a day (BID) | ORAL | Status: DC

## 2014-02-10 MED ORDER — ONDANSETRON HCL 4 MG/2ML IJ SOLN
4.0000 mg | Freq: Once | INTRAMUSCULAR | Status: AC
  Administered 2014-02-10: 4 mg via INTRAVENOUS
  Filled 2014-02-10: qty 2

## 2014-02-10 MED ORDER — MORPHINE SULFATE 4 MG/ML IJ SOLN
4.0000 mg | Freq: Once | INTRAMUSCULAR | Status: AC
  Administered 2014-02-10: 4 mg via INTRAVENOUS
  Filled 2014-02-10: qty 1

## 2014-02-10 NOTE — ED Notes (Signed)
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

## 2014-02-10 NOTE — ED Notes (Signed)
Bed: WA13 Expected date:  Expected time:  Means of arrival:  Comments: ems  

## 2014-02-10 NOTE — Discharge Instructions (Signed)
Neuropathic Pain We often think that pain has a physical cause. If we get rid of the cause, the pain should go away. Nerves themselves can also cause pain. It is called neuropathic pain, which means nerve abnormality. It may be difficult for the patients who have it and for the treating caregivers. Pain is usually described as acute (short-lived) or chronic (long-lasting). Acute pain is related to the physical sensations caused by an injury. It can last from a few seconds to many weeks, but it usually goes away when normal healing occurs. Chronic pain lasts beyond the typical healing time. With neuropathic pain, the nerve fibers themselves may be damaged or injured. They then send incorrect signals to other pain centers. The pain you feel is real, but the cause is not easy to find.  CAUSES  Chronic pain can result from diseases, such as diabetes and shingles (an infection related to chickenpox), or from trauma, surgery, or amputation. It can also happen without any known injury or disease. The nerves are sending pain messages, even though there is no identifiable cause for such messages.   Other common causes of neuropathy include diabetes, phantom limb pain, or Regional Pain Syndrome (RPS).  As with all forms of chronic back pain, if neuropathy is not correctly treated, there can be a number of associated problems that lead to a downward cycle for the patient. These include depression, sleeplessness, feelings of fear and anxiety, limited social interaction and inability to do normal daily activities or work.  The most dramatic and mysterious example of neuropathic pain is called "phantom limb syndrome." This occurs when an arm or a leg has been removed because of illness or injury. The brain still gets pain messages from the nerves that originally carried impulses from the missing limb. These nerves now seem to misfire and cause troubling pain.  Neuropathic pain often seems to have no cause. It responds  poorly to standard pain treatment. Neuropathic pain can occur after:  Shingles (herpes zoster virus infection).  A lasting burning sensation of the skin, caused usually by injury to a peripheral nerve.  Peripheral neuropathy which is widespread nerve damage, often caused by diabetes or alcoholism.  Phantom limb pain following an amputation.  Facial nerve problems (trigeminal neuralgia).  Multiple sclerosis.  Reflex sympathetic dystrophy.  Pain which comes with cancer and cancer chemotherapy.  Entrapment neuropathy such as when pressure is put on a nerve such as in carpal tunnel syndrome.  Back, leg, and hip problems (sciatica).  Spine or back surgery.  HIV Infection or AIDS where nerves are infected by viruses. Your caregiver can explain items in the above list which may apply to you. SYMPTOMS  Characteristics of neuropathic pain are:  Severe, sharp, electric shock-like, shooting, lightening-like, knife-like.  Pins and needles sensation.  Deep burning, deep cold, or deep ache.  Persistent numbness, tingling, or weakness.  Pain resulting from light touch or other stimulus that would not usually cause pain.  Increased sensitivity to something that would normally cause pain, such as a pinprick. Pain may persist for months or years following the healing of damaged tissues. When this happens, pain signals no longer sound an alarm about current injuries or injuries about to happen. Instead, the alarm system itself is not working correctly.  Neuropathic pain may get worse instead of better over time. For some people, it can lead to serious disability. It is important to be aware that severe injury in a limb can occur without a proper, protective pain  response.Burns, cuts, and other injuries may go unnoticed. Without proper treatment, these injuries can become infected or lead to further disability. Take any injury seriously, and consult your caregiver for treatment. DIAGNOSIS    When you have a pain with no known cause, your caregiver will probably ask some specific questions:   Do you have any other conditions, such as diabetes, shingles, multiple sclerosis, or HIV infection?  How would you describe your pain? (Neuropathic pain is often described as shooting, stabbing, burning, or searing.)  Is your pain worse at any time of the day? (Neuropathic pain is usually worse at night.)  Does the pain seem to follow a certain physical pathway?  Does the pain come from an area that has missing or injured nerves? (An example would be phantom limb pain.)  Is the pain triggered by minor things such as rubbing against the sheets at night? These questions often help define the type of pain involved. Once your caregiver knows what is happening, treatment can begin. Anticonvulsant, antidepressant drugs, and various pain relievers seem to work in some cases. If another condition, such as diabetes is involved, better management of that disorder may relieve the neuropathic pain.  TREATMENT  Neuropathic pain is frequently long-lasting and tends not to respond to treatment with narcotic type pain medication. It may respond well to other drugs such as antiseizure and antidepressant medications. Usually, neuropathic problems do not completely go away, but partial improvement is often possible with proper treatment. Your caregivers have large numbers of medications available to treat you. Do not be discouraged if you do not get immediate relief. Sometimes different medications or a combination of medications will be tried before you receive the results you are hoping for. See your caregiver if you have pain that seems to be coming from nowhere and does not go away. Help is available.  SEEK IMMEDIATE MEDICAL CARE IF:   There is a sudden change in the quality of your pain, especially if the change is on only one side of the body.  You notice changes of the skin, such as redness, black or  purple discoloration, swelling, or an ulcer.  You cannot move the affected limbs. Document Released: 01/13/2004 Document Revised: 07/10/2011 Document Reviewed: 01/13/2004 [facility] Patient Information 2015 [place] CenterExitCare, [place]. This information is not intended to replace advice given to you by your health care provider. Make sure you discuss any questions you have with your health care provider.  Urinary Frequency The number of times a normal person urinates depends upon how much liquid they take in and how much liquid they are losing. If the temperature is hot and there is high humidity, then the person will sweat more and usually breathe a little more frequently. These factors decrease the amount of frequency of urination that would be considered normal. The amount you drink is easily determined, but the amount of fluid lost is sometimes more difficult to calculate.  Fluid is lost in two ways:  Sensible fluid loss is usually measured by the amount of urine that you get rid of. Losses of fluid can also occur with diarrhea.  Insensible fluid loss is more difficult to measure. It is caused by evaporation. Insensible loss of fluid occurs through breathing and sweating. It usually ranges from a little less than a quart to a little more than a quart of fluid a day. In normal temperatures and activity levels, the average person may urinate 4 to 7 times in a 24-hour period. Needing to urinate more  often than that could indicate a problem. If one urinates 4 to 7 times in 24 hours and has large volumes each time, that could indicate a different problem from one who urinates 4 to 7 times a day and has small volumes. The time of urinating is also important. Most urinating should be done during the waking hours. Getting up at night to urinate frequently can indicate some problems. CAUSES  The bladder is the organ in your lower abdomen that holds urine. Like a balloon, it swells some as it fills up. Your nerves sense  this and tell you it is time to head for the bathroom. There are a number of reasons that you might feel the need to urinate more often than usual. They include:  Urinary tract infection. This is usually associated with other signs such as burning when you urinate.  In men, problems with the prostate (a walnut-size gland that is located near the tube that carries urine out of your body). There are two reasons why the prostate can cause an increased frequency of urination:  An enlarged prostate that does not let the bladder empty well. If the bladder only half empties when you urinate, then it only has half the capacity to fill before you have to urinate again.  The nerves in the bladder become more hypersensitive with an increased size of the prostate even if the bladder empties completely.  Pregnancy.  Obesity. Excess weight is more likely to cause a problem for women than for men.  Bladder stones or other bladder problems.  Caffeine.  Alcohol.  Medications. For example, drugs that help the body get rid of extra fluid (diuretics) increase urine production. Some other medicines must be taken with lots of fluids.  Muscle or nerve weakness. This might be the result of a spinal cord injury, a stroke, multiple sclerosis, or Parkinson disease.  Long-standing diabetes can decrease the sensation of the bladder. This loss of sensation makes it harder to sense the bladder needs to be emptied. Over a period of years, the bladder is stretched out by constant overfilling. This weakens the bladder muscles so that the bladder does not empty well and has less capacity to fill with new urine.  Interstitial cystitis (also called painful bladder syndrome). This condition develops because the tissues that line the inside of the bladder are inflamed (inflammation is the body's way of reacting to injury or infection). It causes pain and frequent urination. It occurs in women more often than in men. DIAGNOSIS    To decide what might be causing your urinary frequency, your health care provider will probably:  Ask about symptoms you have noticed.  Ask about your overall health. This will include questions about any medications you are taking.  Do a physical examination.  Order some tests. These might include:  A blood test to check for diabetes or other health issues that could be contributing to the problem.  Urine testing. This could measure the flow of urine and the pressure on the bladder.  A test of your neurological system (the brain, spinal cord, and nerves). This is the system that senses the need to urinate.  A bladder test to check whether it is emptying completely when you urinate.  Cystoscopy. This test uses a thin tube with a tiny camera on it. It offers a look inside your urethra and bladder to see if there are problems.  Imaging tests. You might be given a contrast dye and then asked to urinate.  X-rays are taken to see how your bladder is working. TREATMENT  It is important for you to be evaluated to determine if the amount or frequency that you have is unusual or abnormal. If it is found to be abnormal, the cause should be determined and this can usually be found out easily. Depending upon the cause, treatment could include medication, stimulation of the nerves, or surgery. There are not too many things that you can do as an individual to change your urinary frequency. It is important that you balance the amount of fluid intake needed to compensate for your activity and the temperature. Medical problems will be diagnosed and taken care of by your physician. There is no particular bladder training such as Kegel exercises that you can do to help urinary frequency. This is an exercise that is usually recommended for people who have leaking of urine when they laugh, cough, or sneeze. HOME CARE INSTRUCTIONS   Take any medications your health care provider prescribed or suggested.  Follow the directions carefully.  Practice any lifestyle changes that are recommended. These might include:  Drinking less fluid or drinking at different times of the day. If you need to urinate often during the night, for example, you may need to stop drinking fluids early in the evening.  Cutting down on caffeine or alcohol. They both can make you need to urinate more often than normal. Caffeine is found in coffee, tea, and sodas.  Losing weight, if that is recommended.  Keep a journal or a log. You might be asked to record how much you drink and when and where you feel the need to urinate. This will also help evaluate how well the treatment provided by your physician is working. SEEK MEDICAL CARE IF:   Your need to urinate often gets worse.  You feel increased pain or irritation when you urinate.  You notice blood in your urine.  You have questions about any medications that your health care provider recommended.  You notice blood, pus, or swelling at the site of any test or treatment procedure.  You develop a fever of more than 100.49F (38.1C). SEEK IMMEDIATE MEDICAL CARE IF:  You develop a fever of more than 102.71F (38.9C). Document Released: 02/11/2009 Document Revised: 09/01/2013 Document Reviewed: 02/11/2009 [facility] Patient Information 2015 [place]ExitCare, [place]LL[place]. This information is not intended to replace advice given to you by your health care provider. Make sure you discuss any questions you have with your health care provider.

## 2014-02-10 NOTE — ED Notes (Signed)
Patient transported to CT via stretcher Patient in NAD upon leaving for testing 

## 2014-02-10 NOTE — ED Provider Notes (Signed)
CSN: [id number]     Arrival date & time 02/10/14  0800 History   First MD Initiated Contact with Patient 02/10/14 [phone]     Chief Complaint  Patient presents with  . Dysuria     (Consider location/radiation/quality/duration/timing/severity/associated sxs/prior Treatment) HPI  Carla Ridgesthel B Manning Is a 79 year old female brought in by her family for urinary frequency and worsening chronic pain. The patient has a previous history of stroke, some dementia, and right sided hemi-spasticity. She has had about 3 years of ongoing severe burning pain which tends to be worse at night. This past month the pain has been uncontrolled on her pain medication. Her daughter states she normally takes a half of one Norco before bed her pain. She states that she wakes up every morning around 2 AM screaming in pain. She is constantly complaining. She's had previous workups for this pain without acute findings. The patient also has had urinary frequency for the past few weeks however she has had no fevers, chills, blood in her urine and without odor of urine. On Saturday the patient had an hour and a half of abnormal behavior which resolved without deficit to this point. She was described as having confusion from baseline, unable to remember how to do her normal activities of daily living. She's had a history of seizures including complex partial seizure with repetitive behavior in the past and has been seen by neurology for this as well. She is taking Keppra daily. PCP: Dow AdolphBarbara McPherson  Past Medical History  Diagnosis Date  . Stroke     affected R side  . Hypertension   . Hypothyroid    History reviewed. No pertinent past surgical history. Family History  Problem Relation Age of Onset  . Diabetes Mother   . Hypertension Daughter    History  Substance Use Topics  . Smoking status: Never Smoker   . Smokeless tobacco: Never Used  . Alcohol Use: No   OB History   Grav Para Term Preterm Abortions TAB SAB Ect  Mult Living                 Review of Systems  Unable to perform ROS     Allergies  Review of patient's allergies indicates no known allergies.  Home Medications   Prior to Admission medications   Medication Sig Start Date End Date Taking? Authorizing Provider  amLODipine (NORVASC) 10 MG tablet Take 10 mg by mouth daily.   Yes Historical Provider, MD  atorvastatin (LIPITOR) 80 MG tablet Take 80 mg by mouth every evening.   Yes Historical Provider, MD  carvedilol (COREG) 6.25 MG tablet Take 6.25 mg by mouth 2 (two) times daily with a meal.   Yes Historical Provider, MD  clopidogrel (PLAVIX) 75 MG tablet Take 75 mg by mouth daily.   Yes Historical Provider, MD  HYDROcodone-acetaminophen (NORCO/VICODIN) 5-325 MG per tablet Take 0.5 tablets by mouth every 6 (six) hours as needed for moderate pain.   Yes Historical Provider, MD  Influenza Vac Split Quad (FLUZONE) 0.25 ML injection Inject 0.25 mLs into the muscle once.   Yes Historical Provider, MD  levETIRAcetam (KEPPRA) 500 MG tablet Take 500 mg by mouth 2 (two) times daily.   Yes Historical Provider, MD  levothyroxine (SYNTHROID, LEVOTHROID) 25 MCG tablet Take 25 mcg by mouth daily before breakfast.   Yes Historical Provider, MD  naproxen sodium (ANAPROX) 220 MG tablet Take 440 mg by mouth 2 (two) times daily with a meal.   Yes Historical  Provider, MD  ranitidine (ZANTAC) 150 MG capsule Take 150 mg by mouth daily.   Yes Historical Provider, MD   BP 153/59  Pulse 55  Temp(Src) 98.6 F (37 C) (Oral)  Resp 20  SpO2 96% Physical Exam  Constitutional: She appears well-developed and well-nourished. No distress.  HENT:  Head: Normocephalic and atraumatic.  Eyes: Conjunctivae are normal. No scleral icterus.  Neck: Normal range of motion.  Cardiovascular: Normal rate, regular rhythm and normal heart sounds.  Exam reveals no gallop and no friction rub.   No murmur heard. Pulmonary/Chest: Effort normal and breath sounds normal. No  respiratory distress.  Abdominal: Soft. Bowel sounds are normal. She exhibits no distension and no mass. There is no tenderness. There is no guarding.  Neurological: She is alert.  Right-sided hemi-paresis and hemi-spasticity. Right leg is chronically edematous without pitting edema.   Skin: Skin is warm and dry. She is not diaphoretic.    ED Course  Procedures (including critical care time) Labs Review Labs Reviewed  URINALYSIS, ROUTINE W REFLEX MICROSCOPIC - Abnormal; Notable for the following:    APPearance CLOUDY (*)    Leukocytes, UA SMALL (*)    All other components within normal limits  CBC WITH DIFFERENTIAL - Abnormal; Notable for the following:    MCV 77.6 (*)    All other components within normal limits  COMPREHENSIVE METABOLIC PANEL - Abnormal; Notable for the following:    Glucose, Bld 118 (*)    Alkaline Phosphatase 164 (*)    GFR calc non Af Amer 53 (*)    GFR calc Af Amer 61 (*)    All other components within normal limits  URINE MICROSCOPIC-ADD ON    Imaging Review Ct Head Wo Contrast  02/10/2014   CLINICAL DATA:  Severe headache. Previous strokes. Altered mental status.  EXAM: CT HEAD WITHOUT CONTRAST  TECHNIQUE: Contiguous axial images were obtained from the base of the skull through the vertex without intravenous contrast.  COMPARISON:  CT and MRI dated 08/29/2012  FINDINGS: There is no acute intracranial hemorrhage, infarction, or mass lesion. There is an old extensive left parietal infarct with secondary encephalomalacia. There are a few other areas of periventricular white matter lucency consistent with chronic small vessel ischemic disease.  There is diffuse slight cerebral cortical and cerebellar atrophy, stable. No acute osseous abnormality.  IMPRESSION: No acute intracranial abnormality. Atrophy and old left parietal infarct.   Electronically Signed   By: Geanie Cooley M.D.   On: 02/10/2014 11:20     EKG Interpretation None      MDM   Final diagnoses:   Burning pain  Urinary frequency    Patient with chronic neuropathic pain which has been poorly controlled. Symptoms sound like peripheral neuropathy. I have discussed this with the patient as well as my attending physician and will begin the patient on gabapentin. Her lab work appears within normal limits and no sign of urinary tract infection. I discussed the patient's abnormal neurologic complaints this past Saturday with on-call hospitalist Dr. Roseanne Reno. Decrease it sounds consistent with her previous seizure and asked her head the CT alone. Her CT scan is normal. I discussed the findings with the patient's family members. She'll be discharged on gabapentin. I have advised them that they may increase her Norco dose from one half pill to one pill. The patient was given 4 mg of morphine here in the ED with significant relief of her symptoms and was able to sleep. Patient should followup with her  primary care physician as soon as possible regarding her medication changes and any further workup is needed for her chronic symptoms.    Arthor CaptainAbigail Beattie, PA-C 02/11/14 1257

## 2014-02-10 NOTE — ED Notes (Signed)
Patient resting in position of comfort with eyes closed s/p admin of medications, see MAR RR WNL--even and unlabored with equal rise and fall of chest Patient in NAD Side rails up, call bell in reach  

## 2014-02-10 NOTE — ED Notes (Addendum)
Patient states that she does not know why she is here--denies any c/o pain Patient states, "I can't tell you much. I have trouble talking because I had a stroke." Patient alert and oriented, but with slow speech--at baseline Patient denies urinary symptoms Patient afebrile Awaiting arrival of patient's family members Side rails up, call bell in reach

## 2014-02-10 NOTE — ED Notes (Signed)
Patient back from CT Patient remains in NAD Family at bedside, side rails up, call bell in reach

## 2014-02-11 NOTE — ED Provider Notes (Signed)
Medical screening examination/treatment/procedure(s) were performed by non-physician practitioner and as supervising physician I was immediately available for consultation/collaboration.   EKG Interpretation None       Quency Tober R. Lonnell Chaput, MD 02/11/14 1558 

## 2014-02-17 ENCOUNTER — Telehealth: Payer: Self-pay

## 2014-02-17 NOTE — Telephone Encounter (Signed)
Pt will need a refill- Carla BattiestVeronica wants to schedule a follow up appt with Dr. Audria NineMcPherson anytime in 2 weeks before 1pm. She was at work when I called her and would like scheduling to give her a call.

## 2014-02-17 NOTE — Telephone Encounter (Signed)
Suzette BattiestVeronica states her mom had to be admitted to the hospital last week and was given Gabapentin 100mg s, It seems to be working really well and would like Dr Audria NineMcPherson to call her some In. Her mom is home now. Please call [phone]     [facility]

## 2014-02-18 MED ORDER — GABAPENTIN 100 MG PO CAPS: 100.0000 mg | ORAL_CAPSULE | Freq: Two times a day (BID) | ORAL | Status: DC

## 2014-02-18 NOTE — Telephone Encounter (Signed)
I authorized Gabapentin for 1 month; that medication was added by the ED provider due to seizure disorder. Back in Sept, I ordered a referral for pt to follow-up with the neurologist about the ongoing seizures. This is the appointment that the pt really needs. It looks like the visit has been authorized. Can you check to see if pt has an appt with neurology? Thank you.

## 2014-02-20 NOTE — Telephone Encounter (Signed)
LMVM for Carla Manning to CB to schedule appt for Carla Manning and to see if neurology appt has been made.

## 2014-03-02 ENCOUNTER — Telehealth: Payer: Self-pay

## 2014-03-02 NOTE — Telephone Encounter (Signed)
Patient needs medication refill on (HYDROcodone-acetaminophen (NORCO/VICODIN) 5-325 MG per tablet [[id number]][[id number]] ), until her appt with Dr. Audria NineMcpherson on 03/24/2014.  You can call Suzette BattiestVeronica when prescription is ready to pick up. - [phone]

## 2014-03-04 MED ORDER — HYDROCODONE-ACETAMINOPHEN 5-325 MG PO TABS: 0.5000 | ORAL_TABLET | Freq: Four times a day (QID) | ORAL | Status: DC | PRN

## 2014-03-04 NOTE — Telephone Encounter (Signed)
Called [name] to advise, left message.

## 2014-03-04 NOTE — Telephone Encounter (Signed)
Hydrocodone- APAP 5-325 MG prescription printed and ready for pick-up at 104; signature required.

## 2014-03-12 NOTE — Telephone Encounter (Signed)
Appointment is scheduled with Dr. Audria NineMcPherson for 03/24/14 at 1:30 pm.

## 2014-03-18 ENCOUNTER — Other Ambulatory Visit: Payer: Self-pay

## 2014-03-18 MED ORDER — GABAPENTIN 100 MG PO CAPS: 100.0000 mg | ORAL_CAPSULE | Freq: Two times a day (BID) | ORAL | Status: DC

## 2014-03-24 ENCOUNTER — Ambulatory Visit (INDEPENDENT_AMBULATORY_CARE_PROVIDER_SITE_OTHER): Payer: Medicare Other | Admitting: Family Medicine

## 2014-03-24 ENCOUNTER — Encounter: Payer: Self-pay | Admitting: Family Medicine

## 2014-03-24 VITALS — BP 160/75 | HR 66 | Temp 98.8°F | Resp 16

## 2014-03-24 DIAGNOSIS — Z23 Encounter for immunization: Secondary | ICD-10-CM

## 2014-03-24 DIAGNOSIS — Z8673 Personal history of transient ischemic attack (TIA), and cerebral infarction without residual deficits: Secondary | ICD-10-CM

## 2014-03-24 DIAGNOSIS — I10 Essential (primary) hypertension: Secondary | ICD-10-CM | POA: Diagnosis not present

## 2014-03-24 DIAGNOSIS — I639 Cerebral infarction, unspecified: Secondary | ICD-10-CM

## 2014-03-24 DIAGNOSIS — Z8669 Personal history of other diseases of the nervous system and sense organs: Secondary | ICD-10-CM | POA: Diagnosis not present

## 2014-03-24 DIAGNOSIS — I672 Cerebral atherosclerosis: Secondary | ICD-10-CM

## 2014-03-24 DIAGNOSIS — G629 Polyneuropathy, unspecified: Secondary | ICD-10-CM | POA: Diagnosis not present

## 2014-03-24 DIAGNOSIS — M792 Neuralgia and neuritis, unspecified: Secondary | ICD-10-CM

## 2014-03-24 LAB — COMPREHENSIVE METABOLIC PANEL
ALT: 12 U/L (ref 0–35)
AST: 24 U/L (ref 0–37)
Albumin: 4.3 g/dL (ref 3.5–5.2)
Alkaline Phosphatase: 162 U/L — ABNORMAL HIGH (ref 39–117)
BUN: 14 mg/dL (ref 6–23)
CO2: 30 meq/L (ref 19–32)
CREATININE: 1.13 mg/dL — AB (ref 0.50–1.10)
Calcium: 9.1 mg/dL (ref 8.4–10.5)
Chloride: 100 mEq/L (ref 96–112)
GLUCOSE: 77 mg/dL (ref 70–99)
Potassium: 3.9 mEq/L (ref 3.5–5.3)
SODIUM: 138 meq/L (ref 135–145)
TOTAL PROTEIN: 8 g/dL (ref 6.0–8.3)
Total Bilirubin: 0.3 mg/dL (ref 0.2–1.2)

## 2014-03-24 MED ORDER — HYDROCODONE-ACETAMINOPHEN 5-325 MG PO TABS: ORAL_TABLET | ORAL | Status: DC

## 2014-03-24 MED ORDER — GABAPENTIN 100 MG PO CAPS: ORAL_CAPSULE | ORAL | Status: DC

## 2014-03-24 NOTE — Progress Notes (Signed)
IDENTIFYING INFORMATION  Carla Manning / DOB: Sep 18, 1934 / MRN: [id number]  The patient has GERD (gastroesophageal reflux disease); HTN (hypertension); Cerebrovascular disease, arteriosclerotic, post-stroke; Partial seizure; CVA (cerebral vascular accident); Unspecified hypothyroidism; and Paresthesia and pain of right extremity on her problem list.  SUBJECTIVE  Chief Complaint: Medication Refill   History of present illness: Ms. Carla Manning is a 79 y.o. year old female with a history of seizure and CVA who presents for a referral to a neurologist.  Per the daughter, the patient reports increased pain 2/2 peripheral neuropathy.  She has had nerve pain on and off for the last 1.5 years, however in the last 6 months or so it has gotten much worse.  At the onset of this condition, the patient was started on Neurontin 100 mg po bid, and this helped initially.  Now the pain is so severe that the patient "wakes up sobbing," and has poor sleep secondary to the pain.  The daughter would like a referral to a new neurologist to try and "get to the bottom of what is going on." The Norco does not help the pain significantly, however it does help the patient sleep.    The pain comes and goes, and may affect the extremities, and trunk.  She alternates between hot, burning pain at night, to extreme cold sensation during the day.    She would like a refill of the Norco today. Daughter reports that she takes 1 pill bid, mostly at night for sleep, and the patient will then take another pill at 6 am.   Per the daughter, the seizures have been well controlled on Keppra 500 bid, and the daughter reports no incidence of seizures since her last hospital visit.     She  has a past medical history of Stroke; Hypertension; and Hypothyroid.    She has a current medication list which includes the following prescription(s): amlodipine, atorvastatin, carvedilol, clopidogrel, gabapentin, hydrocodone-acetaminophen,  levetiracetam, levothyroxine, naproxen sodium, and ranitidine.  Ms. Carla Manning has No Known Allergies. She  reports that she has never smoked. She has never used smokeless tobacco. She reports that she does not drink alcohol or use illicit drugs. She  reports that she does not engage in sexual activity.  The patient  has no past surgical history on file.  Her family history includes Diabetes in her mother; Hypertension in her daughter.  Review of Systems  HENT: Negative for congestion.   Eyes: Negative.   Respiratory: Negative for wheezing.   Cardiovascular: Positive for leg swelling.  Gastrointestinal: Negative for nausea, vomiting and abdominal pain.  Skin: Negative for itching and rash.  Neurological: Positive for tingling (not new), focal weakness (not new) and weakness. Negative for seizures and loss of consciousness.    OBJECTIVE  Blood pressure 160/75, pulse 66, temperature 98.8 F (37.1 C), resp. rate 16, weight 0 lb (0 kg), SpO2 96 %. The patient's body mass index is 0.00 kg/(m^2).  Physical Exam  Constitutional: She is oriented to person, place, and time. She appears well-nourished. No distress.  HENT:  Right Ear: External ear normal.  Left Ear: External ear normal.  Nose: Nose normal.  Eyes: Right eye exhibits no discharge. Left eye exhibits no discharge. No scleral icterus.  Neck: Normal range of motion.  Cardiovascular: Normal rate, regular rhythm and normal heart sounds.   Respiratory: Effort normal. She has no wheezes. She has no rhonchi. She has no rales.  GI: Normal appearance.  Musculoskeletal: Normal range of motion.  Neurological: She is alert and oriented to person, place, and time.  Skin: Skin is dry. She is not diaphoretic. No pallor.  Extremities:  The right lower extremity is edematous and cool.  The left side is normal.   No results found for this or any previous visit (from the past 24 hour(s)).  ASSESSMENT & PLAN  Carla Manning was seen today for medication  refill.  Diagnoses and associated orders for this visit:  Need for prophylactic vaccination and inoculation against influenza - Flu Vaccine QUAD 36+ mos IM  Peripheral neuropathic pain - gabapentin (NEURONTIN) 100 MG capsule; Take 1 tab in the morning, and 3 tabs at night. - Ambulatory referral to Neurology  History of seizure disorder - Comprehensive metabolic panel    Plan discussed with Dr. Audria NineMcPherson and she is in agreement.    The patient was instructed to to call or comeback to clinic as needed, or should symptoms warrant.  Carla Manning, MHS, PA-C [facility] 03/24/2014 3:07 PM

## 2014-03-24 NOTE — Progress Notes (Addendum)
S: This 79 y.o. AA female is well known to me; she returns today for follow-up of several chronic medical problems- CVA, HTN, seizure disorder and chronic peripheral neuropathy/pain. She is accompanied by her daughter and son. The pt developed a seizure disorder following a major stroke in April 2014. She has been complaining of abnormal sensations/ increased heat on R side of her body; this paresthesias predates the CVA. Gabapentin helped initially but pain has increased, despite Hydrocodone- APAP 5-325 gm 1 tablet twice a day. She has been taking Keppra since May 2014; she has been seizure-free.  Patient Active Problem List   Diagnosis Date Noted  . Paresthesia and pain of right extremity 04/09/2013  . CVA (cerebral vascular accident) 08/31/2012  . Unspecified hypothyroidism 08/31/2012  . Partial seizure 08/29/2012  . GERD (gastroesophageal reflux disease) 05/30/2011  . HTN (hypertension) 05/30/2011  . Cerebrovascular disease, arteriosclerotic, post-stroke 05/13/2010    Prior to Admission medications   Medication Sig Start Date End Date Taking? Authorizing Provider  amLODipine (NORVASC) 10 MG tablet Take 10 mg by mouth daily.   Yes Historical Provider, MD  atorvastatin (LIPITOR) 80 MG tablet Take 80 mg by mouth every evening.   Yes Historical Provider, MD  carvedilol (COREG) 6.25 MG tablet Take 6.25 mg by mouth 2 (two) times daily with a meal.   Yes Historical Provider, MD  clopidogrel (PLAVIX) 75 MG tablet Take 75 mg by mouth daily.   Yes Historical Provider, MD  gabapentin (NEURONTIN) 100 MG capsule Take 1 cap in the morning, and 1 cap at night.   Yes   HYDROcodone-acetaminophen (NORCO/VICODIN) 5-325 MG per tablet Take 1/2 - 1 tablet twice a day as needed for pain.   Yes Maurice MarchBarbara B Areliz Rothman, MD  levETIRAcetam (KEPPRA) 500 MG tablet Take 500 mg by mouth 2 (two) times daily.   Yes Historical Provider, MD  levothyroxine (SYNTHROID, LEVOTHROID) 25 MCG tablet Take 25 mcg by mouth daily before  breakfast.   Yes Historical Provider, MD  naproxen sodium (ANAPROX) 220 MG tablet Take 440 mg by mouth 2 (two) times daily with a meal.   Yes Historical Provider, MD  ranitidine (ZANTAC) 150 MG capsule Take 150 mg by mouth daily.   Yes Historical Provider, MD    ROS: As per HPI.   O: Filed Vitals:   03/24/14 1357  BP: 160/75  Pulse: 66  Temp: 98.8 F (37.1 C)  Resp: 16    GEN: In NAD; seated in WC. Attentive. HENT: [place]/AT. EOMI w/ clear conj and muddy sclerae. Ext ears and nose normal. Oral mucosa moist. COR: RRR. Lower extremity edema noted. LUNGS: Normal resp rate and effort. SKIN: W&D; no erythema, diaphoresis or rashes. NEURO: Alert and oriented to person and place. Speech- dysarthria noted. R-sided hemiparesis unchanged; pt can raise R arm against gravity and can move R foot slightly.   A/P: History of seizure disorder - Plan: Comprehensive metabolic panel  Peripheral neuropathic pain - Plan: gabapentin (NEURONTIN) 100 MG capsule, Ambulatory referral to Neurology  Cerebrovascular disease, arteriosclerotic, post-stroke- Refer back to Neurology for re-evaluation.  Essential hypertension- Stable on current medications.  Need for prophylactic vaccination and inoculation against influenza - Plan: Flu Vaccine QUAD 36+ mos IM   Will increase Gabapentin dose at bedtime to increase effectiveness (reduction of neuropathic pain) and aid w/ sleep (side effect being sedation). This should not interact w/ Keppra. Continue HC-APAP twice a day pending Neurology evaluation.   Daughter states she will check with Dr. Marlis EdelsonSethi's office to see  if there is an outstanding balance and take care of that so that [facility].  Follow-up w/ me in ~3 months or sooner prn.  Maurice MarchBarbara B. Mart Colpitts, MD [facility]

## 2014-04-02 ENCOUNTER — Encounter: Payer: Self-pay | Admitting: Radiology

## 2014-04-13 ENCOUNTER — Other Ambulatory Visit: Payer: Self-pay

## 2014-04-13 MED ORDER — CLOPIDOGREL BISULFATE 75 MG PO TABS: 75.0000 mg | ORAL_TABLET | Freq: Every day | ORAL | Status: DC

## 2014-04-14 ENCOUNTER — Telehealth: Payer: Self-pay

## 2014-04-14 NOTE — Telephone Encounter (Signed)
PATIENT'S DAUGHTER ([name] SUMMERS) CALLED TO LET DR. MCPHERSON KNOW IT'S TIME FOR HER MOTHER'S PRESCRIPTION BE WRITTEN FOR NORCO 5/325 MG. PLEASE LET HER KNOW WHEN THE PRESCRIPTION CAN BE PICKED UP. BEST PHONE [phone] ([name] SUMMERS CELL)  MBC

## 2014-04-16 MED ORDER — HYDROCODONE-ACETAMINOPHEN 5-325 MG PO TABS: ORAL_TABLET | ORAL | Status: DC

## 2014-04-16 NOTE — Telephone Encounter (Signed)
Hydrocodone- APAP 5-325 MG script printed and ready for pick-up at [address]. Signature required. Close at 5 PM.

## 2014-04-16 NOTE — Telephone Encounter (Signed)
Called number given and left detailed VM that rx is ready

## 2014-04-29 ENCOUNTER — Other Ambulatory Visit: Payer: Self-pay | Admitting: Physician Assistant

## 2014-04-29 NOTE — Telephone Encounter (Signed)
At pt's last visit w/ me, I changed gabapentin 100 mg to 1 capsule in the AM and 3 capsules at bedtime.

## 2014-04-29 NOTE — Telephone Encounter (Signed)
Pharm called and stated that pt's daughter would like a new Rx reflecting that pt is now only taking 1 tablet, but TID, for #90 /month. I have pended w/this quantity and sig. Dr Audria NineMcPherson, I don't see that you wanted the change in dosage at last OV. Do you want to OK new sig?

## 2014-05-09 ENCOUNTER — Telehealth: Payer: Self-pay

## 2014-05-09 NOTE — Telephone Encounter (Signed)
Pt's daughter calling stating her mom wants a med. Refill on HYDROcodone-acetaminophen (NORCO/VICODIN) 5-325 MG per tablet [[id number]. Please advise at [phone]

## 2014-05-11 MED ORDER — HYDROCODONE-ACETAMINOPHEN 5-325 MG PO TABS: ORAL_TABLET | ORAL | Status: DC

## 2014-05-11 NOTE — Telephone Encounter (Addendum)
Hydrocodone-APAP prescription printed and ready for pick up at [address] on Tuesday- May 12, 2014. Signature required.

## 2014-05-12 NOTE — Telephone Encounter (Signed)
Advised pt of previous message.

## 2014-06-02 ENCOUNTER — Telehealth: Payer: Self-pay

## 2014-06-02 NOTE — Telephone Encounter (Signed)
Suzette BattiestVeronica called wanting a refill on HYDROcodone-acetaminophen (NORCO/VICODIN) 5-325 MG per tablet [[id number]][[id number]] for Lawernce PittsEthel Buonomo. CB# [phone]

## 2014-06-03 MED ORDER — HYDROCODONE-ACETAMINOPHEN 5-325 MG PO TABS: ORAL_TABLET | ORAL | Status: DC

## 2014-06-03 NOTE — Telephone Encounter (Signed)
Pain medication prescription is at [address] for pick-up. Signature is required.  Please advise daughter, Suzette BattiestVeronica, that she was supposed to contact Dr. Marlis EdelsonSethi's office (neurologist) and get an appt for her mother to follow-up there. I would like to know if she has taken care of that. She was going to contact that office to see if she had an outstanding balance and to make arrangements to get it resolved; her mother needs follow-up w/ the neurologist. Thank you.

## 2014-06-03 NOTE — Telephone Encounter (Signed)
Pt daughter advised ready at [id number]

## 2014-06-10 ENCOUNTER — Other Ambulatory Visit: Payer: Self-pay | Admitting: Family Medicine

## 2014-06-19 ENCOUNTER — Institutional Professional Consult (permissible substitution): Payer: Medicare Other | Admitting: Neurology

## 2014-06-25 ENCOUNTER — Ambulatory Visit (INDEPENDENT_AMBULATORY_CARE_PROVIDER_SITE_OTHER): Payer: Medicare Other | Admitting: Neurology

## 2014-06-25 ENCOUNTER — Encounter: Payer: Self-pay | Admitting: Neurology

## 2014-06-25 VITALS — BP 146/78 | HR 60

## 2014-06-25 DIAGNOSIS — I10 Essential (primary) hypertension: Secondary | ICD-10-CM | POA: Insufficient documentation

## 2014-06-25 DIAGNOSIS — E785 Hyperlipidemia, unspecified: Secondary | ICD-10-CM | POA: Insufficient documentation

## 2014-06-25 DIAGNOSIS — G629 Polyneuropathy, unspecified: Secondary | ICD-10-CM

## 2014-06-25 DIAGNOSIS — I639 Cerebral infarction, unspecified: Secondary | ICD-10-CM | POA: Diagnosis not present

## 2014-06-25 DIAGNOSIS — R569 Unspecified convulsions: Secondary | ICD-10-CM | POA: Diagnosis not present

## 2014-06-25 DIAGNOSIS — G63 Polyneuropathy in diseases classified elsewhere: Secondary | ICD-10-CM | POA: Insufficient documentation

## 2014-06-25 DIAGNOSIS — M5481 Occipital neuralgia: Secondary | ICD-10-CM | POA: Diagnosis not present

## 2014-06-25 DIAGNOSIS — E531 Pyridoxine deficiency: Secondary | ICD-10-CM | POA: Insufficient documentation

## 2014-06-25 DIAGNOSIS — E569 Vitamin deficiency, unspecified: Secondary | ICD-10-CM | POA: Insufficient documentation

## 2014-06-25 NOTE — Patient Instructions (Signed)
-   continue plavix and lipitor for stroke prevention - continue keppra for seizure but consider to taper off at next visit with Dr. Pearlean BrownieSethi - your burning sensation at night most likely due to occipital nerve impingement, which will worsening your right side numbness and tingling. would recommend to relieve the compression on occipital nerve.   - recommend hospital bed. Will see if OT consult can do that. - also recommend to change position during sleep with more lateral lying position - continue neurotin for right sided numbness tingling.  - nerve conduction study to rule out neuropathy - will draw blood tests to rule out neuropathy. - follow up with Dr. Pearlean BrownieSethi in 3 months.

## 2014-06-25 NOTE — Progress Notes (Signed)
STROKE NEUROLOGY FOLLOW UP NOTE  NAME: Carla Manning DOB: [birth year]  REASON FOR VISIT: stroke follow up HISTORY FROM: daughter and chart  Today we had the pleasure of seeing Carla Manning in follow-up at our Neurology Clinic. Pt was accompanied by daughter and son.   History Summary 80 yo F with PMH of HTN, Hypothyroidism, HLD has followed up with Dr. Pearlean Brownie in clinic as per daughter since stroke in 2012. She had left MCA territory stroke in 05/2010, pattern more consistent with embolic stroke, with residue right hemplegia and mild expressive aphasia. She was put on ASA  at that time. In 08/2012, she was admitted to [facility] for seizure-like activity, concerning for seizures. She was put on keppra. However, her MRI showed scattered left ACA territory infarcts. Dr. Pearlean Brownie considered as thrombotic event, and her ASA  changed to plavix. LDL 109 and she was put on lipitor. Continued on keppra. Since discharge, she has not followed up with Dr. Pearlean Brownie yet. However, family denies any recurrent stroke like symptoms.   For today's visit, daughter and son started that since the first stroke, pt started to have right leg numbness and tingling, mild, and later progressed to left arm. This was gradually getting worse over the years. But lately, she developed burning and pain at the back and top of her head at night. It usually happens at 2am in the morning and she would wake up crying due to the burning pain at the head. With the burning pain in the head, her right sided arm and leg will have worsening throbbing pain. She may also felt left arm and leg pain sometimes. However, this will be relieved when in the morning after she gets up and throughout the day. It will happen again when she lies down at night. It happens every night. She has to lie on her back as she can not tolerating lateral positions due to hemiplegia and numbness. She can not lie flat, so she has to use two pillows very night. She denies any  change of muscle strength, speech, vision or swallowing. No seizure episodes since last discharge. Pt denies any burning sensation at the head during this visit. She went to see her PCP and was put on neurontin and percocet but seems not helping much. She was referred here for further evaluation with Dr. Pearlean Brownie. However, Dr. Pearlean Brownie has no recent openings.   REVIEW OF SYSTEMS: Full 14 system review of systems performed and notable only for those listed below and in HPI above, all others are negative:  Constitutional:  fatigue Cardiovascular: swelling legs Ear/Nose/Throat:   Skin:  Eyes:   Respiratory:  SOB Gastroitestinal:   Genitourinary:  Hematology/Lymphatic:   Endocrine: feeling hot and cold Musculoskeletal:   Allergy/Immunology:   Neurological:  Memory loss, confusion, weakness, seizure Psychiatric: depression, anxiety, not enough sleep, decreased energy Sleep:   The following represents the patient's updated allergies and side effects list: No Known Allergies  The neurologically relevant items on the patient's problem list were reviewed on today's visit.  Neurologic Examination  A problem focused neurological exam (12 or more points of the single system neurologic examination, vital signs counts as 1 point, cranial nerves count for 8 points) was performed.  Blood pressure 146/78, pulse 60, height 0' (0 m), weight 0 lb (0 kg).  General - Well nourished, well developed, in no apparent distress.  Ophthalmologic - fundi not visualized due to incorporation.  Cardiovascular - Regular rate and rhythm with no murmur.  Neck - rather mild bilateral occipital neuralgia.  Extremities - RLE distal edematous.   Mental Status -  Level of arousal and orientation to place, and person were intact, but not to age or time. Language including expression, comprehension was assessed and found intact, however, not able to name "palm" or "fist", difficulty with repeating complex  sentences.  Cranial Nerves II - XII - II - Visual field intact OU. III, IV, VI - Extraocular movements intact. V - Facial sensation decreased on the right side. VII - Facial movement intact bilaterally. VIII - Hearing & vestibular intact bilaterally. X - Palate elevates symmetrically. XI - Chin turning & shoulder shrug intact bilaterally. XII - Tongue protrusion intact.  Motor Strength - The patient's strength was 0/5 RUE and 0/5 RLE except right knee extension 2/5. LUE and LLE 5/5.  Bulk was normal and fasciculations were absent.   Motor Tone - Muscle tone was assessed at the neck and appendages and was increased on the RUE and RLE.  Reflexes - The patient's reflexes were 1+ LUE and LLE but 2+ RUE and RLE and she had no pathological reflexes.  Sensory - Light touch, temperature/pinprick were assessed and were decreased on the RUE and RLE comparing with left side.    Coordination - The patient had normal movements in the left hand with no ataxia or dysmetria.  Tremor was absent.  Gait and Station - not able to test due to weakness.  Data reviewed: I personally reviewed the images and agree with the radiology interpretations.  Ct Head Wo Contrast 08/29/2012 Stable appearance of left parietal infarct. No evidence of any increased mass effect or acute hemorrhage.  08/28/2012 Interval development of segmental acute appearing infarct in the left parietal lobe consistent with involvement of the posterior left middle cerebral artery. No acute intracranial hemorrhage.   MRI and MRA of the brain  05/21/10 1. Acute infarct in the left insula. No mass effect or hemorrhage. Multi focal patchy subacute infarcts elsewhere in the left MCA territory, also without mass effect or bleed. 2. Abrupt caliber change in the left MCA M1 segment compatible with plaque or thrombus. Beyond this level, left MCA M2 flow is attenuated, and the posterior most sylvian division is occluded. 3. Mild to  moderate to intracranial atherosclerosis elsewhere, including involving the basilar artery and proximal PCA vessels, but no other hemodynamically significant stenosis or major branch occlusion.   08/29/12 1. Small areas of acute infarct in the high left parietal lobe and in the left medial frontal lobe. 2. Chronic left parietal infarct with volume loss and Encephalomalacia. 3. Intracranial atherosclerotic disease unchanged from 2012. 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  2D Echocardiogram EF 60% normal wall motion, normal LA size.  Carotid Doppler No significant extracranial carotid artery stenosis demonstrated. Vertebrals are patent with antegrade flow.  EKG normal sinus rhythm.   EEG This is a normal EEG recording during wakefulness and during sleep. No evidence of an epileptic disorder was demonstrated.  Assessment: As you may recall, she is a 80 y.o. African American female with PMH of HTN, HLD, embolic stroke in 05/2010 put on ASA, stroke in 08/2012 changed to plavix followed up in clinic today for burning pain at the back and top of head at night. Relieved during the day. During the night, the burning pain at head also aggravates her baseline right sided numbness and tingling resulted from her first stroke in 2012. She has mild  bilateral occipital nerve tenderness on palpation, but she can only sleep in supine position and she has to use two pillows to keep her head at 45 degree. She is not accepting other positions and can not lie flat. Her condition most consistent with occipital neuralgia at night due to occipital nerve compression and impingement. Discussed with daughter and son that we really should focus on the relieve the occipital nerve pressure at night instead of putting more medications such as amitriptyline which can cause cognitive impairment and urinary retention. They expressed understanding. I will also  request hospital bed for pt to help with this. Will do EMG/NCS to evaluate numbness and tingling. I initially would like to check neuropathy panel but apparently her insurance would not cover. She has no seizure (also questionable seizure in 2014 as per daughter's description) for about 2 years now, would consider taper off next visit with Dr. Pearlean Brownie.  Plan:  - continue plavix and lipitor for stroke prevention for now. - continue keppra for now but consider to taper off next visit with Dr. Pearlean Brownie - will request hospital bed for occipital nerve impingement at night - continue neurontin and limit use of narcotics - EMG/NCS - RTC with Dr. Pearlean Brownie in 3 months as she was Dr. Marlis Edelson patient in clinic in the past and she lost follow up with Dr. Pearlean Brownie after her stroke in 2014.  Orders Placed This Encounter  Procedures  . DME Hospital bed    Order Specific Question:  Patient has (list medical condition):    Answer:  occipital impingement and pt can not lie in lateral position,    Order Specific Question:  The above medical condition requires body part(s) to be positioned in ways not feasible with a normal bed: (list body part(s))    Answer:  occipital impingement and pt can not lie in lateral position    Order Specific Question:  Head must be elevated at least:    Answer:  45 degrees    Order Specific Question:  Bed type    Answer:  Semi-electric  . NCV with EMG(electromyography)    Whole body burning sensation    Standing Status: Future     Number of Occurrences:      Standing Expiration Date: 06/26/2015    Order Specific Question:  Where should this test be performed?    Answer:  [facility]    No orders of the defined types were placed in this encounter.    Patient Instructions  - continue plavix and lipitor for stroke prevention - continue keppra for seizure but consider to taper off at next visit with Dr. Pearlean Brownie - your burning sensation at night most likely due to occipital nerve impingement,  which will worsening your right side numbness and tingling. would recommend to relieve the compression on occipital nerve.   - recommend hospital bed. Will see if OT consult can do that. - also recommend to change position during sleep with more lateral lying position - continue neurotin for right sided numbness tingling.  - nerve conduction study to rule out neuropathy - will draw blood tests to rule out neuropathy. - follow up with Dr. Pearlean Brownie in 3 months.    Marvel Plan, MD PhD [facility] [address] [place], [place] [address] [phone]

## 2014-07-01 ENCOUNTER — Telehealth: Payer: Self-pay

## 2014-07-01 NOTE — Telephone Encounter (Signed)
Pt requesting refill on HYDROcodone-acetaminophen (NORCO/VICODIN) 5-325 MG per tablet [[id number]][[id number]]

## 2014-07-03 MED ORDER — HYDROCODONE-ACETAMINOPHEN 5-325 MG PO TABS: ORAL_TABLET | ORAL | Status: DC

## 2014-07-03 NOTE — Telephone Encounter (Signed)
Pt's daughter called again requesting a refill of NORCO/VICODIN. She said tomorrow is her last dosage.

## 2014-07-03 NOTE — Telephone Encounter (Signed)
Phoned pt's daughter to let her know that RX: HC- APAP will be at [address] for pick-up tomorrow.

## 2014-07-03 NOTE — Telephone Encounter (Signed)
Sheketia check on this for me.

## 2014-07-06 ENCOUNTER — Encounter: Payer: Medicare Other | Admitting: Neurology

## 2014-07-08 ENCOUNTER — Ambulatory Visit (INDEPENDENT_AMBULATORY_CARE_PROVIDER_SITE_OTHER): Payer: Self-pay | Admitting: Neurology

## 2014-07-08 ENCOUNTER — Ambulatory Visit (INDEPENDENT_AMBULATORY_CARE_PROVIDER_SITE_OTHER): Payer: Medicare Other | Admitting: Neurology

## 2014-07-08 DIAGNOSIS — G5603 Carpal tunnel syndrome, bilateral upper limbs: Secondary | ICD-10-CM

## 2014-07-08 DIAGNOSIS — G5601 Carpal tunnel syndrome, right upper limb: Secondary | ICD-10-CM

## 2014-07-08 DIAGNOSIS — G5602 Carpal tunnel syndrome, left upper limb: Secondary | ICD-10-CM

## 2014-07-08 DIAGNOSIS — G629 Polyneuropathy, unspecified: Secondary | ICD-10-CM

## 2014-07-08 DIAGNOSIS — G609 Hereditary and idiopathic neuropathy, unspecified: Secondary | ICD-10-CM

## 2014-07-10 ENCOUNTER — Other Ambulatory Visit: Payer: Self-pay | Admitting: Family Medicine

## 2014-07-12 NOTE — Progress Notes (Signed)
  [facility]    Provider:  Dr Lucia GaskinsAhern Referring Provider: Marvel PlanXu, Jindong, MD Primary Care Physician:  Dow AdolphMCPHERSON,BARBARA, MD  Summary:   Nerve Conduction Studies were performed on the bilateral upper extremities.  The right APB median motor nerve showed prolonged distal onset latency (4.3 ms, N<4.0) and reduced amplitude(2.0, N>3)  The left APB median motor nerve showed prolonged distal onset latency (6.1 ms, N<4.0) and reduced amplitude(2.0, N>3)   Bilateral Medial 2nd-digit sensory nerves showed no response  The left median F wave showed prolonged latency (35.401ms, N<34) The right median F wave showed borderline latency (34ms, N<34)  The bilateral Ulnar ADM motor nerves showed normal conductions with normal F Wave latencies Bilateral Ulnar 5th digit sensory nerves showed normal conductions.  The left Peroneal motor nerve showed reduced amplitude (1.380mV, N>2)  The left Tibial motor nerve showed reduced amplitude (1.950mV, N>3) The left Sural and left Peroneal sensory nerves showed no response The left H-Reflex showed no response  EMG needle study of selected left upper and lower extremity muscles was performed. the left First Dorsal Interosseous muscle showed diminished motor unit recruitment. The left Opponens Pollicis muscle showed markedly increased spontaneous activity and diminished motor unit recruitment  The left Gastrocnemius showed prolonged motor unit duration and diminished motor unit recruitment. The left Abductor Hallucis showed increased spontaneous activity and patient could not activate due to pain. The following left-sided muscles were normal: Deltoid, Triceps, Pronator Teres, Vastus Medialis, Anterior Tibialis, Extensor Hallucis Longus, left C6/C7/C8 paraspinals..  Conclusion: This is an abnormal study. There is electrophysiologic evidence of severe left and moderately-severe right Carpal Tunnel Syndrome.  There is also concomitant severe sensorimotor axonal  polyneuropathy. Clinical correlation recommended.   Carla DeanAntonia Myrka Sylva, MD  [facility] [address] [place], [place] [address]  Phone [phone] Fax [phone]

## 2014-07-12 NOTE — Progress Notes (Signed)
See procedure note.

## 2014-07-12 NOTE — Procedures (Signed)
  [facility]    Provider:  Dr Lucia GaskinsAhern Referring Provider: Marvel PlanXu, Jindong, MD Primary Care Physician:  Dow AdolphMCPHERSON,BARBARA, MD  Summary:   Nerve Conduction Studies were performed on the bilateral upper extremities.  The right APB median motor nerve showed prolonged distal onset latency (4.3 ms, N<4.0) and reduced amplitude(2.0, N>3)  The left APB median motor nerve showed prolonged distal onset latency (6.1 ms, N<4.0) and reduced amplitude(2.0, N>3)   Bilateral Medial 2nd-digit sensory nerves showed no response  The left median F wave showed prolonged latency (35.411ms, N<34) The right median F wave showed borderline latency (34ms, N<34)  The bilateral Ulnar ADM motor nerves showed normal conductions with normal F Wave latencies Bilateral Ulnar 5th digit sensory nerves showed normal conductions.  The left Peroneal motor nerve showed reduced amplitude (1.540mV, N>2)  The left Tibial motor nerve showed reduced amplitude (1.540mV, N>3) The left Sural and left Peroneal sensory nerves showed no response The left H-Reflex showed no response  EMG needle study of selected left upper and lower extremity muscles was performed. the left First Dorsal Interosseous muscle showed diminished motor unit recruitment. The left Opponens Pollicis muscle showed markedly increased spontaneous activity and diminished motor unit recruitment  The left Gastrocnemius showed prolonged motor unit duration and diminished motor unit recruitment. The left Abductor Hallucis showed increased spontaneous activity and patient could not activate due to pain. The following muscles were normal: Deltoid, Triceps, Pronator Teres, Vastus Medialis, Anterior Tibialis, Extensor Hallucis Longus, left C6/C7/C8 paraspinals.  Conclusion: This is an abnormal study. There is electrophysiologic evidence of severe left and moderately-severe right Carpal Tunnel Syndrome.  There is also concomitant severe sensorimotor axonal  polyneuropathy. Clinical correlation recommended.   Carla DeanAntonia Ahern, MD  [facility] [address] [place], [place] [id number]  Phone [phone] Fax [phone]

## 2014-07-21 ENCOUNTER — Ambulatory Visit (INDEPENDENT_AMBULATORY_CARE_PROVIDER_SITE_OTHER): Payer: Medicare Other | Admitting: Family Medicine

## 2014-07-21 ENCOUNTER — Encounter: Payer: Self-pay | Admitting: Family Medicine

## 2014-07-21 VITALS — BP 140/70 | HR 59 | Temp 98.4°F | Resp 16

## 2014-07-21 DIAGNOSIS — I10 Essential (primary) hypertension: Secondary | ICD-10-CM | POA: Diagnosis not present

## 2014-07-21 DIAGNOSIS — M79609 Pain in unspecified limb: Secondary | ICD-10-CM | POA: Diagnosis not present

## 2014-07-21 DIAGNOSIS — I639 Cerebral infarction, unspecified: Secondary | ICD-10-CM

## 2014-07-21 DIAGNOSIS — G63 Polyneuropathy in diseases classified elsewhere: Secondary | ICD-10-CM

## 2014-07-21 DIAGNOSIS — E569 Vitamin deficiency, unspecified: Secondary | ICD-10-CM | POA: Diagnosis not present

## 2014-07-21 DIAGNOSIS — Z79899 Other long term (current) drug therapy: Secondary | ICD-10-CM

## 2014-07-21 DIAGNOSIS — R202 Paresthesia of skin: Secondary | ICD-10-CM

## 2014-07-21 DIAGNOSIS — E038 Other specified hypothyroidism: Secondary | ICD-10-CM

## 2014-07-21 DIAGNOSIS — E034 Atrophy of thyroid (acquired): Secondary | ICD-10-CM | POA: Diagnosis not present

## 2014-07-21 LAB — BASIC METABOLIC PANEL
BUN: 17 mg/dL (ref 6–23)
CO2: 29 mEq/L (ref 19–32)
CREATININE: 1.18 mg/dL — AB (ref 0.50–1.10)
Calcium: 8.6 mg/dL (ref 8.4–10.5)
Chloride: 105 mEq/L (ref 96–112)
Glucose, Bld: 146 mg/dL — ABNORMAL HIGH (ref 70–99)
Potassium: 3.8 mEq/L (ref 3.5–5.3)
Sodium: 141 mEq/L (ref 135–145)

## 2014-07-21 MED ORDER — HYDROCODONE-ACETAMINOPHEN 5-325 MG PO TABS: ORAL_TABLET | ORAL | Status: DC

## 2014-07-21 NOTE — Patient Instructions (Signed)
I have refilled your pain medication but you cannot fill it until August 03, 2014. As I have informed you, I am retiring and have suggested that Dr. Nilda SimmerKristi Smith become your primary physician.  I think you should see her in ~ 6 months, sooner if you have a need. It has been my great pleasure and privilege to have served as your physician.  As the best to you and your family!

## 2014-07-22 LAB — TSH: TSH: 2.686 u[IU]/mL (ref 0.350–4.500)

## 2014-07-22 LAB — VITAMIN B12: Vitamin B-12: 885 pg/mL (ref 211–911)

## 2014-07-22 NOTE — Progress Notes (Signed)
Subjective:    Patient ID: Carla RidgesEthel B Manning, female    DOB: 11/04/1934, 80 y.o.   MRN: [id number]  HPI This 80 y.o. Female is accompanied by her daughter, Carla Manning. Pt has HTN, stable on current medications (amlodipine 10 mg daily and carvedilol 6,25 mg bid). Pt had a CVA w/ R hemiparesis and expressive aphasia in 2013 and again in May 2014. Partial seizures resulted from CVAs. [facility] monitors and manages stroke prophylaxis w/ Plavix; anti-epileptic medication- Keppra 500 mg twice a day. Daughter reports OV yesterday and discussion regarding discontinuing Keppra.   Pt has had persistent R extremity paresthesia and peripheral neuropathy. NCV and EMG revealed CTS. Pt referred to Hand Specialist and may have surgery per daughter. SHe still needs hydrocodone- APAP for chronic pain.  Hypothyroidism- pt is compliant w/ medication; Last TFTs in Aug 2015 (all normal).   Daughter reports pt recently celebrated [age] birthday and had a "[id number]" theme party.   Patient Active Problem List   Diagnosis Date Noted  . Vitamin deficiency 06/25/2014  . Vitamin B6 deficiency neuropathy 06/25/2014  . Occipital neuralgia 06/25/2014  . Essential hypertension 06/25/2014  . HLD (hyperlipidemia) 06/25/2014  . Paresthesia and pain of right extremity 04/09/2013  . CVA (cerebral vascular accident) 08/31/2012  . Hypothyroidism 08/31/2012  . Partial seizure 08/29/2012  . GERD (gastroesophageal reflux disease) 05/30/2011  . Cerebrovascular disease, arteriosclerotic, post-stroke 05/13/2010    Prior to Admission medications   Medication Sig Start Date End Date Taking? Authorizing Provider  amLODipine (NORVASC) 10 MG tablet Take 10 mg by mouth daily.   Yes Historical Provider, MD  atorvastatin (LIPITOR) 80 MG tablet Take 80 mg by mouth every evening.   Yes Historical Provider, MD  atorvastatin (LIPITOR) 80 MG tablet Take 1 tablet (80 mg total) by mouth daily. PATIENT NEEDS OFFICE VISIT/FASTING LABS  FOR ADDITIONAL REFILLS 07/10/14  Yes Maurice MarchBarbara B Etana Beets, MD  carvedilol (COREG) 6.25 MG tablet Take 6.25 mg by mouth 2 (two) times daily with a meal.   Yes Historical Provider, MD  carvedilol (COREG) 6.25 MG tablet Take 1 tablet (6.25 mg total) by mouth 2 (two) times daily with a meal. PATIENT NEEDS OFFICE VISIT FOR ADDITIONAL REFILLS 07/10/14  Yes Maurice MarchBarbara B Bettejane Leavens, MD  clopidogrel (PLAVIX) 75 MG tablet Take 1 tablet (75 mg total) by mouth daily. 04/13/14  Yes Maurice MarchBarbara B Jakell Trusty, MD  gabapentin (NEURONTIN) 100 MG capsule Take 1 capsule in the AM and 3 capsules hs. 04/29/14  Yes Maurice MarchBarbara B Tavious Griesinger, MD  HYDROcodone-acetaminophen (NORCO/VICODIN) 5-325 MG per tablet Take 1/2 - 1 tablet twice a day as needed for pain.   Yes Maurice MarchBarbara B Quentina Fronek, MD  levETIRAcetam (KEPPRA) 500 MG tablet Take 500 mg by mouth 2 (two) times daily.   Yes Historical Provider, MD  levETIRAcetam (KEPPRA) 500 MG tablet Take 1 tablet (500 mg total) by mouth every 12 (twelve) hours. PATIENT NEEDS OFFICE VISIT FOR ADDITIONAL REFILLS 07/10/14  Yes Maurice MarchBarbara B Deangelo Berns, MD  levothyroxine (SYNTHROID, LEVOTHROID) 25 MCG tablet Take 25 mcg by mouth daily before breakfast.   Yes Historical Provider, MD  levothyroxine (SYNTHROID, LEVOTHROID) 25 MCG tablet Take 1 tablet (25 mcg total) by mouth daily before breakfast. PATIENT NEEDS OFFICE VISIT FOR ADDITIONAL REFILLS 07/10/14  Yes Maurice MarchBarbara B Marylene Masek, MD  naproxen sodium (ANAPROX) 220 MG tablet Take 440 mg by mouth 2 (two) times daily with a meal.   Yes Historical Provider, MD  ranitidine (ZANTAC) 150 MG capsule Take 150 mg by mouth daily.  Yes Historical Provider, MD    SOC and FAM HX reviewed.   Review of Systems  Constitutional: Negative for fever, diaphoresis, activity change and appetite change.  Eyes: Negative.   Respiratory: Negative for cough, chest tightness and shortness of breath.   Cardiovascular: Negative for chest pain and palpitations.       Trace pre-tibial edema; sits  in WC all day.  Gastrointestinal: Negative.   Musculoskeletal: Positive for arthralgias.  Skin: Negative.   Neurological: Positive for numbness. Negative for dizziness, tremors, syncope and headaches.       Objective:   Physical Exam  Constitutional: She is oriented to person, place, and time. Vital signs are normal. She appears well-developed and well-nourished. No distress.  Blood pressure 140/70, pulse 59, temperature 98.4 F (36.9 C), temperature source Oral, resp. rate 16, height 0' (0 m), weight 0 lb (0 kg), SpO2 98 %. Appears stated age and chronically ill.  HENT:  Head: Normocephalic and atraumatic.  Right Ear: External ear normal.  Left Ear: External ear normal.  Nose: Nose normal.  Mouth/Throat: Oropharynx is clear and moist.  Eyes: Conjunctivae and EOM are normal. No scleral icterus.  Muddy sclerae.  Cardiovascular: Normal rate, regular rhythm and normal heart sounds.   Pulmonary/Chest: Effort normal. No respiratory distress. She has no wheezes. She has no rales.  Musculoskeletal: She exhibits edema. She exhibits no tenderness.  Seated in WC; no deformities of limbs.  Neurological: She is alert and oriented to person, place, and time. She exhibits normal muscle tone. Coordination normal.  Mild facial paralysis.  Skin: Skin is warm and dry. She is not diaphoretic.  Psychiatric: She has a normal mood and affect. Thought content normal. She is slowed. Cognition and memory are impaired.  Mild dysathria. Understands spoken word but hesitant when speaking, problems with word finding.  Nursing note and vitals reviewed.     Assessment & Plan:  Essential hypertension -  Continue current medications. Plan: Basic metabolic panel  Hypothyroidism due to acquired atrophy of thyroid - Plan: TSH  Paresthesia and pain of right extremity- Follow-up w/ ORTHO/ HAND Specialist.  Vitamin deficiency related neuropathy - Plan: Vitamin B12  Polypharmacy - Plan: Vitamin B12  Meds  ordered this encounter  Medications  . HYDROcodone-acetaminophen (NORCO/VICODIN) 5-325 MG per tablet    Sig: Take 1/2 - 1 tablet twice a day as needed for pain.    Dispense:  50 tablet    Refill:  0    May fill on or after August 03, 2014.

## 2014-07-23 NOTE — Progress Notes (Signed)
Quick Note:  Please advise pt regarding following labs... Recent lab result are normal; kidney function stable and blood sugar was above normal. Make sure you are eating healthy; avoid very sugary and sweet foods. Drink more water to help your kidneys.  Vitamin B12 level is normal. Thyroid test is normal.  Copy to pt. ______

## 2014-08-25 ENCOUNTER — Telehealth: Payer: Self-pay

## 2014-08-25 NOTE — Telephone Encounter (Signed)
Pt states she is in need of her HYDROCODONE 5-325. Please call [phone]

## 2014-08-26 MED ORDER — HYDROCODONE-ACETAMINOPHEN 5-325 MG PO TABS: ORAL_TABLET | ORAL | Status: DC

## 2014-08-26 NOTE — Telephone Encounter (Signed)
Hydrocodone prescription printed and at [facility] [address] for pick-up.

## 2014-09-01 ENCOUNTER — Other Ambulatory Visit: Payer: Self-pay | Admitting: Family Medicine

## 2014-09-29 ENCOUNTER — Telehealth: Payer: Self-pay

## 2014-09-29 NOTE — Telephone Encounter (Signed)
Patient would like a refill for hydrocodone. Please call when ready! [phone]

## 2014-09-29 NOTE — Telephone Encounter (Signed)
Dr. Audria NineMcPherson is her doctor. Please advise on refill.

## 2014-09-29 NOTE — Telephone Encounter (Signed)
Please advise on referral

## 2014-09-30 MED ORDER — HYDROCODONE-ACETAMINOPHEN 5-325 MG PO TABS: ORAL_TABLET | ORAL | Status: DC

## 2014-09-30 NOTE — Telephone Encounter (Signed)
I have filled #30 [facility] for pt[facility] No refills. Let's have her come back in to get any further refills, it looks like Dr Audria NineMcPherson was seeing her every 3-6 months and she was last seen about 3 months ago.

## 2014-10-01 NOTE — Telephone Encounter (Signed)
Notified pt on VM that Rx is ready and she needs to RTC to est w/new provider bf she needs another RF.

## 2014-10-02 ENCOUNTER — Other Ambulatory Visit: Payer: Self-pay | Admitting: Family Medicine

## 2014-10-02 NOTE — Telephone Encounter (Signed)
OV notes from March OV state that Plavix and seizure med is managed by neurologist, but Dr Audria NineMcPherson Rxd the Plavix last in Dec for 6 mos. Called pharm to see if the neurologist has ever Rxd it for pt and they reported that all of their records show Dr Audria NineMcPherson. Can we RF?

## 2014-10-05 NOTE — Telephone Encounter (Signed)
Will approve the 90 day refill with no refills. Looking back through notes it looks like she last saw neuro 2/25. They're note states continue plavix for stroke prevention for now, and to f/u with Dr Pearlean BrownieSethi in 3 months. We will refill this time but she needs to see neuro prior to any further plavix from us to ensure they want her to continue on this medication.

## 2014-10-13 ENCOUNTER — Telehealth: Payer: Self-pay

## 2014-10-13 NOTE — Telephone Encounter (Signed)
Pt daughter called for a prescription refill of Hydrocodone.  [phone]

## 2014-10-13 NOTE — Telephone Encounter (Signed)
Patient needs to RTC to be seen for further refills. [name], MS, PA-C   7:52 PM, 10/13/2014

## 2014-10-14 ENCOUNTER — Telehealth: Payer: Self-pay

## 2014-10-14 NOTE — Telephone Encounter (Signed)
Patient's daughter returned Carla Manning's call. States that her mother only received a 2 week supply around the time that Dr. Audria Nine left the practice. Patient has an appointment scheduled for 11/04/2014. Can she have enough Hydrocodone until then?  [phone]

## 2014-10-14 NOTE — Telephone Encounter (Signed)
Left message on machine letting them know.

## 2014-10-15 NOTE — Telephone Encounter (Signed)
Patient was being seen by Dr. Audria Nine on a monthly basis.  Her medication has been refilled by Ra[facility] Ensign once since her last visit and she was supplied with 2 weeks at that time.  Patient needs to RTC for further refills per Mr. McVeigh's recommendation.  No refills until she is seen.  [name], MS, PA-C   9:09 AM, 10/15/2014

## 2014-10-15 NOTE — Telephone Encounter (Signed)
She states she made an appointment on 11/04/2014. Can she have enough until her appt because she does not want her without her medication. Please advise. Pt will run out of medication on Friday.

## 2014-10-15 NOTE — Telephone Encounter (Signed)
Patient should return to walk in clinic for refill hydrocodone. Chronic pain should be managed by a pain clinic. For the patient's age, 59, there is an increased risk for delirium with narcotic medications. Delirium is a serious mental disturbance condition that can lead to confusion and loss of awareness of one self and their surroundings. This may last a few hours or a few days, even months. Patients with multiple medical conditions tend do worse if delirium becomes a problem. There is also increased risk of falls with narcotic medications which can lead to fractures and decreased quality of life. Please let patient know our walk in hours as she is due for follow up anyway. Thank you!

## 2014-10-16 NOTE — Telephone Encounter (Signed)
Left message for pt to call back  °

## 2014-10-16 NOTE — Telephone Encounter (Signed)
Spoke with pt's daughter she is worried about her mom not having this mediction and really does not understand why it is denied for refill until her appt. She states her mom is in pain everyday. She was referred to Dr. Katrinka Blazing and wants Dr. Katrinka Blazing to review this message.

## 2014-10-20 NOTE — Telephone Encounter (Signed)
Record reviewed in detail. Please contact daughter and advise her that a prescription will be ready for pick up on Wednesday, 10/21/14 as I am not in the office today.

## 2014-10-20 NOTE — Telephone Encounter (Signed)
Can [facility] look out for this Rx when Dr. Katrinka Blazing is there for appts.

## 2014-10-20 NOTE — Telephone Encounter (Signed)
Left a detailed message advising pt's daughter to come in to pick up Rx on Wednesday.

## 2014-10-21 ENCOUNTER — Other Ambulatory Visit: Payer: Self-pay | Admitting: Family Medicine

## 2014-10-21 ENCOUNTER — Telehealth: Payer: Self-pay

## 2014-10-21 MED ORDER — HYDROCODONE-ACETAMINOPHEN 5-325 MG PO TABS: ORAL_TABLET | ORAL | Status: DC

## 2014-10-21 NOTE — Telephone Encounter (Signed)
Pt's daughter called and advsd Norco 5-325 mg Rx is ready for pick up at the [facility] [address].

## 2014-11-04 ENCOUNTER — Encounter: Payer: Self-pay | Admitting: Family Medicine

## 2014-11-04 ENCOUNTER — Ambulatory Visit (INDEPENDENT_AMBULATORY_CARE_PROVIDER_SITE_OTHER): Payer: Medicare Other | Admitting: Family Medicine

## 2014-11-04 ENCOUNTER — Other Ambulatory Visit: Payer: Self-pay | Admitting: Family Medicine

## 2014-11-04 VITALS — BP 146/76 | HR 59 | Temp 98.9°F | Resp 16

## 2014-11-04 DIAGNOSIS — E569 Vitamin deficiency, unspecified: Secondary | ICD-10-CM | POA: Diagnosis not present

## 2014-11-04 DIAGNOSIS — I10 Essential (primary) hypertension: Secondary | ICD-10-CM | POA: Diagnosis not present

## 2014-11-04 DIAGNOSIS — E785 Hyperlipidemia, unspecified: Secondary | ICD-10-CM | POA: Diagnosis not present

## 2014-11-04 DIAGNOSIS — G5602 Carpal tunnel syndrome, left upper limb: Secondary | ICD-10-CM | POA: Diagnosis not present

## 2014-11-04 DIAGNOSIS — Z8673 Personal history of transient ischemic attack (TIA), and cerebral infarction without residual deficits: Secondary | ICD-10-CM

## 2014-11-04 DIAGNOSIS — M6530 Trigger finger, unspecified finger: Secondary | ICD-10-CM

## 2014-11-04 DIAGNOSIS — I639 Cerebral infarction, unspecified: Secondary | ICD-10-CM | POA: Diagnosis not present

## 2014-11-04 DIAGNOSIS — M5481 Occipital neuralgia: Secondary | ICD-10-CM | POA: Diagnosis not present

## 2014-11-04 DIAGNOSIS — E034 Atrophy of thyroid (acquired): Secondary | ICD-10-CM

## 2014-11-04 DIAGNOSIS — Z23 Encounter for immunization: Secondary | ICD-10-CM

## 2014-11-04 DIAGNOSIS — K219 Gastro-esophageal reflux disease without esophagitis: Secondary | ICD-10-CM

## 2014-11-04 DIAGNOSIS — G63 Polyneuropathy in diseases classified elsewhere: Secondary | ICD-10-CM

## 2014-11-04 DIAGNOSIS — E531 Pyridoxine deficiency: Secondary | ICD-10-CM

## 2014-11-04 DIAGNOSIS — I672 Cerebral atherosclerosis: Secondary | ICD-10-CM | POA: Diagnosis not present

## 2014-11-04 DIAGNOSIS — E038 Other specified hypothyroidism: Secondary | ICD-10-CM

## 2014-11-04 DIAGNOSIS — R569 Unspecified convulsions: Secondary | ICD-10-CM | POA: Diagnosis not present

## 2014-11-04 DIAGNOSIS — R739 Hyperglycemia, unspecified: Secondary | ICD-10-CM | POA: Diagnosis not present

## 2014-11-04 DIAGNOSIS — G609 Hereditary and idiopathic neuropathy, unspecified: Secondary | ICD-10-CM | POA: Insufficient documentation

## 2014-11-04 LAB — CBC WITH DIFFERENTIAL/PLATELET
BASOS PCT: 0 % (ref 0–1)
Basophils Absolute: 0 10*3/uL (ref 0.0–0.1)
EOS ABS: 0.1 10*3/uL (ref 0.0–0.7)
EOS PCT: 1 % (ref 0–5)
HCT: 38.3 % (ref 36.0–46.0)
Hemoglobin: 13 g/dL (ref 12.0–15.0)
Lymphocytes Relative: 44 % (ref 12–46)
Lymphs Abs: 2.4 10*3/uL (ref 0.7–4.0)
MCH: 27.3 pg (ref 26.0–34.0)
MCHC: 33.9 g/dL (ref 30.0–36.0)
MCV: 80.5 fL (ref 78.0–100.0)
MPV: 9.7 fL (ref 8.6–12.4)
Monocytes Absolute: 0.4 10*3/uL (ref 0.1–1.0)
Monocytes Relative: 7 % (ref 3–12)
NEUTROS PCT: 48 % (ref 43–77)
Neutro Abs: 2.6 10*3/uL (ref 1.7–7.7)
PLATELETS: 153 10*3/uL (ref 150–400)
RBC: 4.76 MIL/uL (ref 3.87–5.11)
RDW: 16.7 % — AB (ref 11.5–15.5)
WBC: 5.4 10*3/uL (ref 4.0–10.5)

## 2014-11-04 LAB — COMPREHENSIVE METABOLIC PANEL
ALT: 15 U/L (ref 0–35)
AST: 25 U/L (ref 0–37)
Albumin: 4 g/dL (ref 3.5–5.2)
Alkaline Phosphatase: 158 U/L — ABNORMAL HIGH (ref 39–117)
BUN: 17 mg/dL (ref 6–23)
CHLORIDE: 105 meq/L (ref 96–112)
CO2: 25 meq/L (ref 19–32)
Calcium: 8.7 mg/dL (ref 8.4–10.5)
Creat: 1.02 mg/dL (ref 0.50–1.10)
Glucose, Bld: 132 mg/dL — ABNORMAL HIGH (ref 70–99)
POTASSIUM: 3.7 meq/L (ref 3.5–5.3)
Sodium: 141 mEq/L (ref 135–145)
Total Bilirubin: 0.3 mg/dL (ref 0.2–1.2)
Total Protein: 7.5 g/dL (ref 6.0–8.3)

## 2014-11-04 LAB — LIPID PANEL
CHOLESTEROL: 142 mg/dL (ref 0–200)
HDL: 67 mg/dL (ref >=46)
LDL Cholesterol: 59 mg/dL (ref 0–99)
Total CHOL/HDL Ratio: 2.1 Ratio
Triglycerides: 78 mg/dL (ref <150)
VLDL: 16 mg/dL (ref 0–40)

## 2014-11-04 MED ORDER — HYDROCODONE-ACETAMINOPHEN 5-325 MG PO TABS: ORAL_TABLET | ORAL | Status: DC

## 2014-11-04 MED ORDER — GABAPENTIN 100 MG PO CAPS: ORAL_CAPSULE | ORAL | Status: DC

## 2014-11-04 NOTE — Progress Notes (Signed)
Subjective:    Patient ID: Carla Manning, female    DOB: 11-07-1934, 80 y.o.   MRN: [id number]  11/04/2014  Medication Refill; Hypertension; Hypothyroidism; and Hyperlipidemia   HPI This 80 y.o. female presents to establish care and for four month follow-up. Pt was previous patient of Dr. Audria Nine who has recently retired.   Last physical:  2015 Pap smear: never Mammogram:  No mammogram since 2013 Colonoscopy:  Never; not interested Bone density:  Never; wheelchair bound; no ambulation TDAP:  Not sure Pneumovax: Pneumovax 2014 Zostavax: never Influenza: 2015 Eye exam:  Not recently; no glaucoma or cataracts Dental exam:  Dentures.  Hypertension:  Amlodipine 10mg  daily and Coreg 6.25mg  bid. Patient reports good compliance with medication, good tolerance to medication, and good symptom control.     Hyperlipidemia: Lipitor 80mg  daily. Patient reports good compliance with medication, good tolerance to medication, and good symptom control.  Non-fasting today; waffles, egg, sausage.    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  ASA 325mg  daily after CVA.  In 08/2012, admitted for seizure activity; placed on Keppra; MRI showed scattered L ACA infarcts.  Dr. Pearlean Brownie considered thrombotic event and ASA was changed to Plavix.  Started on Lipitor and Keppra continued.   followed by neurology; maintained on Plavix therapy.  Discussing discontinuing Keppra. Two CVAs; 2013; 2014.  R sided weakness of arm and leg; aphasia.  Wheelchair bound.  Eats well; no difficulties swallowing.  Followed neurology every six months.  Maintained on Plavix.  Control of b/b; no incontinence.  Lives with son.  Keppra bid; neurologist discussed weaning Keppra but has not weaned it yet.  No decrease in dose at last visit.    Hypothyroidism: Patient reports good compliance with medication, good tolerance to medication, and good symptom control.   Last TSH 06/2014 and WNL.  GERD: taking Zantac 150mg  bid PRN.  Takes once daily every day.    Peripheral Neuropathy: followed by Lucia Gaskins; maintained on Gabapentin; started on Gabapentin one year ago.  No further recommendations or changes.  Burning in legs keeps up at night.  Takes hydrocodone at bedtime for burning in legs.  Hydrocodone 1/2 at 8:00pm; then takes 1 at 2:00am.  Takes 1/2 at 8:00am. Taking Gabapentin 1 at 8:00am; takes 1 at 8:00pm; then takes 1 at 10:00pm.  Hesitation to increase Gabapentin due to other medications.  Also suffers with burning in posterior head at night; R sided extremities with worsening pain as well.    S/p NCS/EMG in 06/2014 that revealed:  This is an abnormal study. There is electrophysiologic evidence of severe left and moderately-severe right Carpal Tunnel Syndrome. There is also concomitant severe sensorimotor axonal polyneuropathy. Clinical correlation recommended.    Chronic pain syndrome: followed by Dr. Audria Nine every four months.    CTS: recommended surgery but no referral made.  Waiting on referral to hand surgery.  L hand with carpal tunnel; chronic issue for patient.    No wrist splint.     Review of Systems  Constitutional: Negative for fever, chills, diaphoresis and fatigue.  Eyes: Negative for visual disturbance.  Respiratory: Negative for cough and shortness of breath.   Cardiovascular: Negative for chest pain, palpitations and leg swelling.  Gastrointestinal: Negative for nausea, vomiting, abdominal pain, diarrhea and constipation.  Endocrine: Negative for cold intolerance, heat intolerance, polydipsia, polyphagia and polyuria.  Skin: Negative for rash and wound.  Neurological: Positive for weakness and numbness. Negative  for dizziness, tremors, seizures, syncope, facial asymmetry, speech difficulty, light-headedness and headaches.    Past Medical History  Diagnosis Date  . Hypertension   . Hypothyroid   . Anxiety   . Depression   .  Seizures     post CVA  . Stroke     R sided weakness, aphasia  . Hyperlipidemia   . GERD (gastroesophageal reflux disease)    Past Surgical History  Procedure Laterality Date  . No past surgeries     No Known Allergies Current Outpatient Prescriptions  Medication Sig Dispense Refill  . amLODipine (NORVASC) 10 MG tablet Take 1 tablet by mouth daily 30 tablet 4  . atorvastatin (LIPITOR) 80 MG tablet Take 1 tablet by mouth daily 30 tablet 4  . carvedilol (COREG) 6.25 MG tablet Take 1 tablet by mouth 2 times daily with a meal 60 tablet 4  . clopidogrel (PLAVIX) 75 MG tablet Take 1 tablet by mouth daily 90 tablet 0  . gabapentin (NEURONTIN) 100 MG capsule TAKE ONE CAPSULE BY MOUTH EVERY MORNING AND 3 CAPSULES AT BEDTIME 120 capsule 4  . HYDROcodone-acetaminophen (NORCO/VICODIN) 5-325 MG per tablet Take 1/2 - 1 tablet twice a day as needed for pain. 60 tablet 0  . levETIRAcetam (KEPPRA) 500 MG tablet TAKE 1 TABLET BY MOUTH DAILY EVERY 12 HOURS 60 tablet 4  . levothyroxine (SYNTHROID, LEVOTHROID) 25 MCG tablet Take 1 tablet by mouth daily before breakfast 30 tablet 4  . naproxen sodium (ANAPROX) 220 MG tablet Take 440 mg by mouth 2 (two) times daily with a meal.    . ranitidine (ZANTAC) 150 MG capsule Take 150 mg by mouth daily.     No current facility-administered medications for this visit.   History   Social History  . Marital Status: Divorced    Spouse Name: N/A  . Number of Children: 12  . Years of Education: 6   Occupational History  . Retired    Social History Main Topics  . Smoking status: Never Smoker   . Smokeless tobacco: Never Used  . Alcohol Use: No  . Drug Use: No  . Sexual Activity: No   Other Topics Concern  . Not on file   Social History Narrative   Patient is single with 12 children.   Patient is now left handed.   Patient has 6 th grade education.   Patient drinks 1 1/2 cup daily.   Family History  Problem Relation Age of Onset  . Diabetes Mother     . Hypertension Daughter         Objective:    BP 146/76 mmHg  Pulse 59  Temp(Src) 98.9 F (37.2 C) (Oral)  Resp 16  SpO2 97% Physical Exam  Constitutional: She is oriented to person, place, and time. She appears well-developed and well-nourished. No distress.  HENT:  Head: Normocephalic and atraumatic.  Right Ear: External ear normal.  Left Ear: External ear normal.  Nose: Nose normal.  Mouth/Throat: Oropharynx is clear and moist.  Eyes: Conjunctivae and EOM are normal. Pupils are equal, round, and reactive to light.  Neck: Normal range of motion. Neck supple. Carotid bruit is not present. No thyromegaly present.  Cardiovascular: Normal rate, regular rhythm, normal heart sounds and intact distal pulses.  Exam reveals no gallop and no friction rub.   No murmur heard. R leg with 2+ edema to proximal leg.    Pulmonary/Chest: Effort normal and breath sounds normal. She has no wheezes. She has no rales.  Abdominal: Soft. Bowel sounds are normal.  Lymphadenopathy:    She has no cervical adenopathy.  Neurological: She is alert and oriented to person, place, and time. No cranial nerve deficit. She exhibits abnormal muscle tone.  R sided weakness; wheelchair bound. Broken speech.  Skin: Skin is warm and dry. No rash noted. She is not diaphoretic. No erythema. No pallor.  Diffuse scaling of R foot dorsal aspect.  Long dystrophic nails.  Psychiatric: She has a normal mood and affect. Her behavior is normal.   Results for orders placed or performed in visit on 07/21/14  TSH  Result Value Ref Range   TSH 2.686 0.350 - 4.500 uIU/mL  Basic metabolic panel  Result Value Ref Range   Sodium 141 135 - 145 mEq/L   Potassium 3.8 3.5 - 5.3 mEq/L   Chloride 105 96 - 112 mEq/L   CO2 29 19 - 32 mEq/L   Glucose, Bld 146 (H) 70 - 99 mg/dL   BUN 17 6 - 23 mg/dL   Creat 8.651.18 (H) 7.840.50 - 1.10 mg/dL   Calcium 8.6 8.4 - 69.610.5 mg/dL  Vitamin E95B12  Result Value Ref Range   Vitamin B-12 885 211 - 911  pg/mL   PREVNAR 13 ADMINISTERED BY GAYLE.    Assessment & Plan:   1. HLD (hyperlipidemia)   2. Essential hypertension   3. Vitamin deficiency   4. Vitamin B6 deficiency neuropathy   5. Partial seizure   6. Bilateral occipital neuralgia   7. Hypothyroidism due to acquired atrophy of thyroid   8. Gastroesophageal reflux disease without esophagitis   9. Cerebrovascular disease, arteriosclerotic, post-stroke   10. Need for prophylactic vaccination against Streptococcus pneumoniae (pneumococcus)   11. Trigger finger, left   12. Left carpal tunnel syndrome   13. Hereditary and idiopathic peripheral neuropathy     1. Hyperlipidemia: stable; obtain labs; continue current medications. 2.  HTN: controlled; obtain labs; continue Amlodipine and Coreg daily. 3.  Seizures: stable; no recent seizure activity; managed by neurology; discussed possibly weaning Keppra in future.  Onset post-CVA. 4.  Hypothyroidism: controlled; continue medications. 5.  GERD: controlled with Zantac once daily. 6.  History of CVA: stable; maintained on Plavix; two CVAs in past; seizure onset post-CVA.  R sided weakness and aphasia. 7.  L trigger finger 2nd digit: New.  Interferes with function; refer to hand surgery for injection. 8. B carpal tunnel syndrome: New.  S/p EMG/NCS: refer to hand surgery to discuss treatment options; recommend wrist splint qhs; increase Neurontin to 3 tablets qhs; continue 1every morning. 9. Axonal Sensorimotor neuropathy: uncontrolled; suffering with significant burning at nighttime; increase Gabapentin to 3 at bedtime and 1 every morning.  Will continue to slowly increase dose of Gabapentin at each visit.  Refill fo hydrocodone provided as well which is being taken for neuropathy pain.  Will also coordinate follow-up with Dr. Pearlean BrownieSethi as recommended by Dr. Lucia GaskinsAhern in 06/2014.   Meds ordered this encounter  Medications  . gabapentin (NEURONTIN) 100 MG capsule    Sig: TAKE ONE CAPSULE BY MOUTH  EVERY MORNING AND 3 CAPSULES AT BEDTIME    Dispense:  120 capsule    Refill:  4    This prescription was filled today. Any refills authorized will be placed on file.  Marland Kitchen. HYDROcodone-acetaminophen (NORCO/VICODIN) 5-325 MG per tablet    Sig: Take 1/2 - 1 tablet twice a day as needed for pain.    Dispense:  60 tablet    Refill:  0  Return in about 4 months (around 03/07/2015) for complete physical examiniation.    Cannon Quinton Paulita Fujita, M.D. [facility] [address] [place], [place]  [address] [phone] phone [phone] fax

## 2014-11-04 NOTE — Patient Instructions (Signed)
1. Recommend buying a L wrist splint and wearing at bedtime.

## 2014-11-05 LAB — HEMOGLOBIN A1C
HEMOGLOBIN A1C: 5.7 % — AB (ref <5.7)
Mean Plasma Glucose: 117 mg/dL — ABNORMAL HIGH (ref <117)

## 2014-11-07 ENCOUNTER — Encounter: Payer: Self-pay | Admitting: Family Medicine

## 2014-12-07 ENCOUNTER — Other Ambulatory Visit: Payer: Self-pay | Admitting: Family Medicine

## 2014-12-07 ENCOUNTER — Telehealth: Payer: Self-pay

## 2014-12-07 MED ORDER — HYDROCODONE-ACETAMINOPHEN 5-325 MG PO TABS: ORAL_TABLET | ORAL | Status: DC

## 2014-12-07 NOTE — Telephone Encounter (Signed)
Please call --- hydrocodone ready for pick up. 

## 2014-12-07 NOTE — Telephone Encounter (Signed)
HYDROcodone-acetaminophen (NORCO/VICODIN) 5-325 MG per tablet Refill Request   [phone] (H)

## 2014-12-11 NOTE — Telephone Encounter (Signed)
Rx at 104. Daughter advised.

## 2015-01-06 ENCOUNTER — Other Ambulatory Visit: Payer: Self-pay | Admitting: Physician Assistant

## 2015-01-13 ENCOUNTER — Telehealth: Payer: Self-pay

## 2015-01-13 NOTE — Telephone Encounter (Signed)
Pt states she is in need of her HYDROCODONE 5-325 MG. Please call [phone]

## 2015-01-18 MED ORDER — HYDROCODONE-ACETAMINOPHEN 5-325 MG PO TABS: ORAL_TABLET | ORAL | Status: DC

## 2015-01-18 NOTE — Telephone Encounter (Signed)
Call --- rx for hydrocodone ready for pick up at 104.

## 2015-01-18 NOTE — Telephone Encounter (Signed)
Daughter informed

## 2015-02-03 ENCOUNTER — Other Ambulatory Visit: Payer: Self-pay | Admitting: Physician Assistant

## 2015-02-15 ENCOUNTER — Telehealth: Payer: Self-pay

## 2015-02-15 MED ORDER — HYDROCODONE-ACETAMINOPHEN 5-325 MG PO TABS: ORAL_TABLET | ORAL | Status: DC

## 2015-02-15 NOTE — Telephone Encounter (Signed)
lmom that rx is ready for pickup at 102 

## 2015-02-15 NOTE — Telephone Encounter (Signed)
Pt in need of her HYDROCODONE 5-325 MG. Please call [phone] when ready for pick up

## 2015-02-15 NOTE — Telephone Encounter (Signed)
Call -- hydrocodone rx ready for pick up. 

## 2015-02-19 ENCOUNTER — Other Ambulatory Visit: Payer: Self-pay

## 2015-02-19 MED ORDER — CLOPIDOGREL BISULFATE 75 MG PO TABS: 75.0000 mg | ORAL_TABLET | Freq: Every day | ORAL | Status: DC

## 2015-02-19 NOTE — Telephone Encounter (Signed)
Pharm called with a RF request for Plavix. Pt has appt 11/7. Gave 30 day RF verbal

## 2015-03-08 ENCOUNTER — Ambulatory Visit (INDEPENDENT_AMBULATORY_CARE_PROVIDER_SITE_OTHER): Payer: Medicare Other | Admitting: Family Medicine

## 2015-03-08 ENCOUNTER — Encounter: Payer: Self-pay | Admitting: Family Medicine

## 2015-03-08 VITALS — BP 132/78 | HR 54 | Temp 98.2°F | Resp 16

## 2015-03-08 DIAGNOSIS — Z0000 Encounter for general adult medical examination without abnormal findings: Secondary | ICD-10-CM

## 2015-03-08 DIAGNOSIS — E034 Atrophy of thyroid (acquired): Secondary | ICD-10-CM | POA: Diagnosis not present

## 2015-03-08 DIAGNOSIS — E531 Pyridoxine deficiency: Secondary | ICD-10-CM | POA: Diagnosis not present

## 2015-03-08 DIAGNOSIS — E785 Hyperlipidemia, unspecified: Secondary | ICD-10-CM | POA: Diagnosis not present

## 2015-03-08 DIAGNOSIS — R569 Unspecified convulsions: Secondary | ICD-10-CM

## 2015-03-08 DIAGNOSIS — Z8673 Personal history of transient ischemic attack (TIA), and cerebral infarction without residual deficits: Secondary | ICD-10-CM | POA: Diagnosis not present

## 2015-03-08 DIAGNOSIS — E038 Other specified hypothyroidism: Secondary | ICD-10-CM

## 2015-03-08 DIAGNOSIS — R739 Hyperglycemia, unspecified: Secondary | ICD-10-CM | POA: Diagnosis not present

## 2015-03-08 DIAGNOSIS — I672 Cerebral atherosclerosis: Secondary | ICD-10-CM

## 2015-03-08 DIAGNOSIS — M5481 Occipital neuralgia: Secondary | ICD-10-CM | POA: Diagnosis not present

## 2015-03-08 DIAGNOSIS — Z23 Encounter for immunization: Secondary | ICD-10-CM | POA: Diagnosis not present

## 2015-03-08 DIAGNOSIS — L608 Other nail disorders: Secondary | ICD-10-CM | POA: Diagnosis not present

## 2015-03-08 DIAGNOSIS — K219 Gastro-esophageal reflux disease without esophagitis: Secondary | ICD-10-CM

## 2015-03-08 DIAGNOSIS — R7309 Other abnormal glucose: Secondary | ICD-10-CM | POA: Diagnosis not present

## 2015-03-08 DIAGNOSIS — I10 Essential (primary) hypertension: Secondary | ICD-10-CM | POA: Diagnosis not present

## 2015-03-08 LAB — CBC WITH DIFFERENTIAL/PLATELET
BASOS ABS: 0 10*3/uL (ref 0.0–0.1)
Basophils Relative: 0 % (ref 0–1)
EOS ABS: 0.1 10*3/uL (ref 0.0–0.7)
EOS PCT: 1 % (ref 0–5)
HCT: 39.2 % (ref 36.0–46.0)
Hemoglobin: 13.3 g/dL (ref 12.0–15.0)
LYMPHS ABS: 2.4 10*3/uL (ref 0.7–4.0)
Lymphocytes Relative: 38 % (ref 12–46)
MCH: 27.4 pg (ref 26.0–34.0)
MCHC: 33.9 g/dL (ref 30.0–36.0)
MCV: 80.7 fL (ref 78.0–100.0)
MPV: 10.6 fL (ref 8.6–12.4)
Monocytes Absolute: 0.4 10*3/uL (ref 0.1–1.0)
Monocytes Relative: 6 % (ref 3–12)
NEUTROS ABS: 3.5 10*3/uL (ref 1.7–7.7)
Neutrophils Relative %: 55 % (ref 43–77)
PLATELETS: 154 10*3/uL (ref 150–400)
RBC: 4.86 MIL/uL (ref 3.87–5.11)
RDW: 15.1 % (ref 11.5–15.5)
WBC: 6.3 10*3/uL (ref 4.0–10.5)

## 2015-03-08 LAB — COMPREHENSIVE METABOLIC PANEL
ALK PHOS: 173 U/L — AB (ref 33–130)
ALT: 14 U/L (ref 6–29)
AST: 25 U/L (ref 10–35)
Albumin: 4.2 g/dL (ref 3.6–5.1)
BUN: 15 mg/dL (ref 7–25)
CO2: 28 mmol/L (ref 20–31)
Calcium: 8.8 mg/dL (ref 8.6–10.4)
Chloride: 103 mmol/L (ref 98–110)
Creat: 1.1 mg/dL — ABNORMAL HIGH (ref 0.60–0.93)
GLUCOSE: 88 mg/dL (ref 65–99)
POTASSIUM: 4.4 mmol/L (ref 3.5–5.3)
Sodium: 143 mmol/L (ref 135–146)
Total Bilirubin: 0.3 mg/dL (ref 0.2–1.2)
Total Protein: 7.8 g/dL (ref 6.1–8.1)

## 2015-03-08 LAB — LIPID PANEL
CHOL/HDL RATIO: 2 ratio (ref <=5.0)
Cholesterol: 140 mg/dL (ref 125–200)
HDL: 70 mg/dL (ref >=46)
LDL CALC: 56 mg/dL (ref <130)
TRIGLYCERIDES: 69 mg/dL (ref <150)
VLDL: 14 mg/dL (ref <30)

## 2015-03-08 LAB — TSH: TSH: 2.247 u[IU]/mL (ref 0.350–4.500)

## 2015-03-08 MED ORDER — AMLODIPINE BESYLATE 10 MG PO TABS: ORAL_TABLET | ORAL | Status: DC

## 2015-03-08 MED ORDER — CARVEDILOL 6.25 MG PO TABS: ORAL_TABLET | ORAL | Status: DC

## 2015-03-08 MED ORDER — ATORVASTATIN CALCIUM 80 MG PO TABS: 80.0000 mg | ORAL_TABLET | Freq: Every day | ORAL | Status: DC

## 2015-03-08 MED ORDER — LEVOTHYROXINE SODIUM 25 MCG PO TABS: ORAL_TABLET | ORAL | Status: DC

## 2015-03-08 MED ORDER — CLOPIDOGREL BISULFATE 75 MG PO TABS: 75.0000 mg | ORAL_TABLET | Freq: Every day | ORAL | Status: DC

## 2015-03-08 MED ORDER — HYDROCODONE-ACETAMINOPHEN 5-325 MG PO TABS: ORAL_TABLET | ORAL | Status: DC

## 2015-03-08 NOTE — Progress Notes (Signed)
Subjective:    Patient ID: Carla Manning, female    DOB: 1935/03/11, 80 y.o.   MRN: [id number]  03/08/2015  Annual Exam and Medication Refill   HPI This 80 y.o. female presents with two daughters for Annual Wellness Examination and chronic medical follow-up:  Last physical:  2015 Pap smear: never Mammogram:  2013; declines further Colonoscopy: never; refuses Bone density:  Never; wheelchair bound; no ambulation; risk of hip fracture low. TDAP:  Not sure Pneumovax: 2014; Prevnar 13 2016. Zostavax: never Influenza: annually; today. Eye exam: none recently Dental exam:  Dentures.   HTN: Patient reports good compliance with medication, good tolerance to medication, and good symptom control.  Amlodipine  and Coreg 6.25 bid.  BP today 132/78.   Hyperlipidemia: Patient reports good compliance with medication, good tolerance to medication, and good symptom control.  Lipitor  daily.  CVA with seizures: L MCA CVA 05/2010 embolic stroke with residual R hemiplegia and mild expressive aphasia.  ASA  after CVA.  08/2012, new onset seizures; placed on Keppra; MRI showed scattered L ACA infarcts; ASA changed to Plavix per Pearlean Brownie; started on Lipitor; Keppra continued.  Two CVAs.  Wheel chair bound.  No difficulties swallowing; neurology every six months.No incontinence.  Lives with son.  Hypothyroidism: Patient reports good compliance with medication, good tolerance to medication, and good symptom control.    GERD: Zantac  bid.  Denies n/v/d/c; denies melena or bloody stools; no abdominal pain.   Peripheral neuropathy: increased Gabapentin to  qhs and  every am at last visit; taking hydrocodone at bedtime, 1/2 at 8:00pm, 1 at 2:00am.  Taking 1/2 at 8:00am.  Has seemed worse since last visit due to increase in urination at nighttime since going up on Gabapentin. Pain is worse.  Decreased Gabapentin  two at bedtime.  Did not increase to 3 tablets at bedtime long.   Hydrocodone 1/2 qam, 1 at bedtime, 1/2 in middle of night.  Did space out Gabapentin 8:00pm with persistent frequency.    CTS: requested referral to hand surgery; moderate to severe R CTS.  Trigger finger: referred to hand surgery.   Review of Systems  Constitutional: Negative for fever, chills, diaphoresis, activity change, appetite change, fatigue and unexpected weight change.  HENT: Negative for congestion, dental problem, drooling, ear discharge, ear pain, facial swelling, hearing loss, mouth sores, nosebleeds, postnasal drip, rhinorrhea, sinus pressure, sneezing, sore throat, tinnitus, trouble swallowing and voice change.   Eyes: Negative for photophobia, pain, discharge, redness, itching and visual disturbance.  Respiratory: Negative for apnea, cough, choking, chest tightness, shortness of breath, wheezing and stridor.   Cardiovascular: Negative for chest pain, palpitations and leg swelling.  Gastrointestinal: Negative for nausea, vomiting, abdominal pain, diarrhea, constipation, blood in stool, abdominal distention, anal bleeding and rectal pain.  Endocrine: Negative for cold intolerance, heat intolerance, polydipsia, polyphagia and polyuria.  Genitourinary: Negative for dysuria, urgency, frequency, hematuria, flank pain, decreased urine volume, vaginal bleeding, vaginal discharge, enuresis, difficulty urinating, genital sores, vaginal pain, menstrual problem, pelvic pain and dyspareunia.  Musculoskeletal: Positive for myalgias. Negative for back pain, joint swelling, arthralgias, gait problem, neck pain and neck stiffness.  Skin: Negative for color change, pallor, rash and wound.  Allergic/Immunologic: Negative for environmental allergies, food allergies and immunocompromised state.  Neurological: Positive for weakness and numbness. Negative for dizziness, tremors, seizures, syncope, facial asymmetry, speech difficulty, light-headedness and headaches.  Hematological: Negative for  adenopathy. Does not bruise/bleed easily.  Psychiatric/Behavioral: Negative for suicidal ideas, hallucinations, behavioral problems, confusion, sleep  disturbance, self-injury, dysphoric mood, decreased concentration and agitation. The patient is not nervous/anxious and is not hyperactive.     Past Medical History  Diagnosis Date  . Hypertension   . Hypothyroid   . Anxiety   . Depression   . Seizures (HCC)     post CVA  . Stroke [facility])     R sided weakness, aphasia  . Hyperlipidemia   . GERD (gastroesophageal reflux disease)    Past Surgical History  Procedure Laterality Date  . No past surgeries     No Known Allergies Current Outpatient Prescriptions  Medication Sig Dispense Refill  . amLODipine (NORVASC) 10 MG tablet TAKE 1 TABLET BY MOUTH DIALY 30 tablet 5  . atorvastatin (LIPITOR) 80 MG tablet Take 1 tablet (80 mg total) by mouth daily. 30 tablet 5  . carvedilol (COREG) 6.25 MG tablet Take 1 tablet by mouth 2 times daily with a meal 60 tablet 5  . gabapentin (NEURONTIN) 100 MG capsule TAKE ONE CAPSULE BY MOUTH EVERY MORNING AND 3 CAPSULES AT BEDTIME 120 capsule 4  . HYDROcodone-acetaminophen (NORCO/VICODIN) 5-325 MG tablet Take 1/2 - 1 tablet twice a day as needed for pain. 60 tablet 0  . levETIRAcetam (KEPPRA) 500 MG tablet Take 1 tablet by mouth daily every 12 hours 60 tablet 2  . levothyroxine (SYNTHROID, LEVOTHROID) 25 MCG tablet Take 1 tablet by mouth daily before breakfast 30 tablet 11  . naproxen sodium (ANAPROX) 220 MG tablet Take 440 mg by mouth 2 (two) times daily with a meal.    . ranitidine (ZANTAC) 150 MG capsule Take 150 mg by mouth daily.    . clopidogrel (PLAVIX) 75 MG tablet Take 1 tablet (75 mg total) by mouth daily. 30 tablet 11   No current facility-administered medications for this visit.   Social History   Social History  . Marital Status: Divorced    Spouse Name: N/A  . Number of Children: 12  . Years of Education: 6   Occupational History  .  Retired    Social History Main Topics  . Smoking status: Never Smoker   . Smokeless tobacco: Never Used  . Alcohol Use: No  . Drug Use: No  . Sexual Activity: No   Other Topics Concern  . Not on file   Social History Narrative   Marital status: single/divorced.      Children: 12 children; 52 grandchildren; 13 gg.      Lives: with son;       Employment: retired/disability.  Forensic scientist.         Tobacco: none      Alcohol: none      ADLs: wheelchair bound; no incontinence; walks to bathroom; bathing self; needs some assistance with dressing; can brush teeth herself;no cooking; no driving.  No finances; no grocery shopping.  Caregiver on weekends Sunday and Monday at nights and all day.      Advanced Directives:  HCPOA [name] [id number]);  FULL CODE.     Patient is now left handed.   Patient has 6 th grade education.   Patient drinks 1 1/2 cup daily.   Family History  Problem Relation Age of Onset  . Diabetes Mother   . Hypertension Daughter   . Hypertension Daughter   . Diabetes Daughter        Objective:    BP 132/78 mmHg  Pulse 54  Temp(Src) 98.2 F (36.8 C) (Oral)  Resp 16  Ht   Wt  Physical Exam  Constitutional: She is oriented to person, place, and time. She appears well-developed and well-nourished. No distress.  HENT:  Head: Normocephalic and atraumatic.  Right Ear: External ear normal.  Left Ear: External ear normal.  Nose: Nose normal.  Mouth/Throat: Oropharynx is clear and moist.  Eyes: Conjunctivae and EOM are normal. Pupils are equal, round, and reactive to light.  Neck: Normal range of motion and full passive range of motion without pain. Neck supple. No JVD present. Carotid bruit is not present. No thyromegaly present.  Cardiovascular: Normal rate, regular rhythm and normal heart sounds.  Exam reveals no gallop and no friction rub.   No murmur heard. Pulmonary/Chest: Effort normal and breath sounds normal. She has no wheezes.  She has no rales.  Abdominal: Soft. Bowel sounds are normal. She exhibits no distension and no mass. There is no tenderness. There is no rebound and no guarding.  Musculoskeletal:       Right shoulder: Normal.       Left shoulder: Normal.       Cervical back: Normal.  Lymphadenopathy:    She has no cervical adenopathy.  Neurological: She is alert and oriented to person, place, and time. She has normal reflexes. No cranial nerve deficit. She exhibits normal muscle tone. Coordination normal.  Skin: Skin is warm and dry. No rash noted. She is not diaphoretic. No erythema. No pallor.  Very long thickened discolored toenails B; verbal consent obtained; toenails trimmed during visit.  Psychiatric: She has a normal mood and affect. Her behavior is normal. Judgment and thought content normal.  Nursing note and vitals reviewed.       Assessment & Plan:   1. Encounter for Medicare annual wellness exam   2. Cerebrovascular disease, arteriosclerotic, post-stroke   3. Partial seizure (HCC)   4. Gastroesophageal reflux disease without esophagitis   5. Vitamin B6 deficiency neuropathy   6. Bilateral occipital neuralgia   7. Hypothyroidism due to acquired atrophy of thyroid   8. HLD (hyperlipidemia)   9. Essential hypertension   10. Hyperglycemia   11. Need for prophylactic vaccination and inoculation against influenza    -Anticipatory guidance --- weight loss, ASA  daily, 3 servings of dairy daily or 1200 mg calcium daily.  Pt declines mammogram, colonoscopy, bone density scan.  S/p flu vaccine in office.  Has advanced directives --- FULL CODE.  Low to moderate fall risk due to previous CVA. Needs assistance with ADLs due to CVA hx.  No evidence of depression. No hearing loss.   -Obtain labs. -Refills provided. -s/p flu vaccine. -Obtain u/a with urine culture to evaluate for acute UTI which may be contributing to nocturia.  Increase in Gabapentin qhs with associated worsening nocturia.  Would  administer Gabapentin tid with second dose around 5:00pm and third dose around bedtime. -s/p nail trimming of B feet  Orders Placed This Encounter  Procedures  . Flu Vaccine QUAD 36+ mos IM  . Microalbumin, urine  . CBC with Differential/Platelet  . Comprehensive metabolic panel    Order Specific Question:  Has the patient fasted?    Answer:  Yes  . Hemoglobin A1c  . Lipid panel    Order Specific Question:  Has the patient fasted?    Answer:  Yes  . TSH  . POCT urinalysis dipstick   Meds ordered this encounter  Medications  . amLODipine (NORVASC) 10 MG tablet    Sig: TAKE 1 TABLET BY MOUTH DIALY    Dispense:  30 tablet  Refill:  5    This prescription was filled today(02/03/2015). Any refills authorized will be placed on file.  Marland Kitchen. atorvastatin (LIPITOR) 80 MG tablet    Sig: Take 1 tablet (80 mg total) by mouth daily.    Dispense:  30 tablet    Refill:  5    This prescription was filled today(01/06/2015). Any refills authorized will be placed on file.  . carvedilol (COREG) 6.25 MG tablet    Sig: Take 1 tablet by mouth 2 times daily with a meal    Dispense:  60 tablet    Refill:  5    This prescription was filled today(01/06/2015). Any refills authorized will be placed on file.  Marland Kitchen. DISCONTD: clopidogrel (PLAVIX) 75 MG tablet    Sig: Take 1 tablet (75 mg total) by mouth daily.    Dispense:  30 tablet    Refill:  11    This prescription was filled today. Any refills authorized will be placed on file.  . levothyroxine (SYNTHROID, LEVOTHROID) 25 MCG tablet    Sig: Take 1 tablet by mouth daily before breakfast    Dispense:  30 tablet    Refill:  11    This prescription was filled today(01/06/2015). Any refills authorized will be placed on file.  Marland Kitchen. HYDROcodone-acetaminophen (NORCO/VICODIN) 5-325 MG tablet    Sig: Take 1/2 - 1 tablet twice a day as needed for pain.    Dispense:  60 tablet    Refill:  0    Return in about 4 months (around 07/06/2015) for recheck high blood pressure,  high cholesterol, pain.    Mayre Bury Paulita FujitaMartin Mahamadou Weltz, M.D. [facility]  [place] [address] [place], [place]  [id number] [phone] phone [phone] fax

## 2015-03-08 NOTE — Patient Instructions (Signed)

## 2015-03-09 LAB — HEMOGLOBIN A1C
Hgb A1c MFr Bld: 5.7 % — ABNORMAL HIGH (ref <5.7)
Mean Plasma Glucose: 117 mg/dL — ABNORMAL HIGH (ref <117)

## 2015-03-10 ENCOUNTER — Telehealth: Payer: Self-pay

## 2015-03-10 NOTE — Telephone Encounter (Signed)
Called the pharmacy and they did not know which medication they were requesting. Advised to call back when they figure it out.

## 2015-03-10 NOTE — Telephone Encounter (Signed)
[facility] called to check the status of a refill request.  [phone]

## 2015-03-11 ENCOUNTER — Encounter: Payer: Self-pay | Admitting: Family Medicine

## 2015-03-20 ENCOUNTER — Other Ambulatory Visit: Payer: Self-pay | Admitting: Family Medicine

## 2015-03-20 DIAGNOSIS — I672 Cerebral atherosclerosis: Secondary | ICD-10-CM

## 2015-03-20 DIAGNOSIS — Z8673 Personal history of transient ischemic attack (TIA), and cerebral infarction without residual deficits: Principal | ICD-10-CM

## 2015-03-20 MED ORDER — CLOPIDOGREL BISULFATE 75 MG PO TABS: 75.0000 mg | ORAL_TABLET | Freq: Every day | ORAL | Status: DC

## 2015-03-20 NOTE — Progress Notes (Signed)
Patient daughter called stating pharmacy did not receive plavix rx on 03/08/2015. They did receive all the other refills. I reviewed chart and the plavix rx was set on phone in instead of normal. This was an error. Escribed plavix rx to [facility] and daughter notified.

## 2015-04-06 ENCOUNTER — Other Ambulatory Visit: Payer: Self-pay | Admitting: Family Medicine

## 2015-04-15 ENCOUNTER — Telehealth: Payer: Self-pay

## 2015-04-15 NOTE — Telephone Encounter (Signed)
Patient needs a refill on the  Medication   HYDROcodone-acetaminophen (NORCO/VICODIN) 5-325 MG tablet [34505]       HYDROcodone-acetaminophen (NORCO/VICODIN) 5-325 MG tablet

## 2015-04-16 MED ORDER — HYDROCODONE-ACETAMINOPHEN 5-325 MG PO TABS: ORAL_TABLET | ORAL | Status: DC

## 2015-04-16 NOTE — Telephone Encounter (Signed)
Called pt and advised RX ready for pick up  

## 2015-04-16 NOTE — Telephone Encounter (Signed)
Call-  Prescription ready for pick up at 104.

## 2015-05-18 ENCOUNTER — Telehealth: Payer: Self-pay

## 2015-05-18 NOTE — Telephone Encounter (Signed)
Pt is needing a refll on hydrocodone  Best number [phone]

## 2015-05-24 MED ORDER — HYDROCODONE-ACETAMINOPHEN 5-325 MG PO TABS: ORAL_TABLET | ORAL | Status: DC

## 2015-05-24 NOTE — Telephone Encounter (Signed)
Refill approved.

## 2015-06-18 ENCOUNTER — Telehealth: Payer: Self-pay

## 2015-06-18 NOTE — Telephone Encounter (Signed)
Patient's daughter is calling to request a refill for hydrocodone. Please call [name] when ready! [phone]

## 2015-06-22 ENCOUNTER — Telehealth: Payer: Self-pay

## 2015-06-22 MED ORDER — HYDROCODONE-ACETAMINOPHEN 5-325 MG PO TABS: ORAL_TABLET | ORAL | Status: DC

## 2015-06-22 NOTE — Telephone Encounter (Signed)
Dr. Smith  Please see previous message 

## 2015-06-22 NOTE — Telephone Encounter (Signed)
Carla Manning states her mom is in more pain than usual and she is Dr Michaelle Copas pt, didn't know what she wanted her to do. Advised to bring her in but would prefer a call back from the Dr Please call [phone]

## 2015-06-22 NOTE — Telephone Encounter (Signed)
Call --- hydrocodone prescription is ready for pick up at 104.

## 2015-06-23 NOTE — Telephone Encounter (Signed)
Notified daughter ready at 22.

## 2015-06-24 NOTE — Telephone Encounter (Signed)
Please call for details.  Where is patient hurting more?  When did pain worsen?  When does pain seem to be worse?

## 2015-06-26 NOTE — Telephone Encounter (Signed)
Spoke with [name] (daughter)  She states mom is doing fine now

## 2015-06-27 NOTE — Telephone Encounter (Signed)
Noted  

## 2015-07-09 ENCOUNTER — Ambulatory Visit: Payer: Self-pay | Admitting: Family Medicine

## 2015-07-20 ENCOUNTER — Encounter: Payer: Self-pay | Admitting: Family Medicine

## 2015-07-20 ENCOUNTER — Ambulatory Visit (INDEPENDENT_AMBULATORY_CARE_PROVIDER_SITE_OTHER): Payer: Medicare Other | Admitting: Family Medicine

## 2015-07-20 VITALS — BP 155/80 | HR 64 | Temp 98.9°F | Resp 16

## 2015-07-20 DIAGNOSIS — E785 Hyperlipidemia, unspecified: Secondary | ICD-10-CM

## 2015-07-20 DIAGNOSIS — G63 Polyneuropathy in diseases classified elsewhere: Secondary | ICD-10-CM

## 2015-07-20 DIAGNOSIS — E038 Other specified hypothyroidism: Secondary | ICD-10-CM | POA: Diagnosis not present

## 2015-07-20 DIAGNOSIS — M5481 Occipital neuralgia: Secondary | ICD-10-CM | POA: Diagnosis not present

## 2015-07-20 DIAGNOSIS — E531 Pyridoxine deficiency: Secondary | ICD-10-CM | POA: Diagnosis not present

## 2015-07-20 DIAGNOSIS — R569 Unspecified convulsions: Secondary | ICD-10-CM | POA: Diagnosis not present

## 2015-07-20 DIAGNOSIS — I10 Essential (primary) hypertension: Secondary | ICD-10-CM

## 2015-07-20 DIAGNOSIS — L603 Nail dystrophy: Secondary | ICD-10-CM | POA: Diagnosis not present

## 2015-07-20 DIAGNOSIS — K219 Gastro-esophageal reflux disease without esophagitis: Secondary | ICD-10-CM | POA: Diagnosis not present

## 2015-07-20 DIAGNOSIS — I6930 Unspecified sequelae of cerebral infarction: Secondary | ICD-10-CM | POA: Insufficient documentation

## 2015-07-20 DIAGNOSIS — E034 Atrophy of thyroid (acquired): Secondary | ICD-10-CM | POA: Diagnosis not present

## 2015-07-20 LAB — CBC WITH DIFFERENTIAL/PLATELET
Basophils Absolute: 0 10*3/uL (ref 0.0–0.1)
Basophils Relative: 0 % (ref 0–1)
Eosinophils Absolute: 0.1 10*3/uL (ref 0.0–0.7)
Eosinophils Relative: 2 % (ref 0–5)
HEMATOCRIT: 37.7 % (ref 36.0–46.0)
HEMOGLOBIN: 12.6 g/dL (ref 12.0–15.0)
LYMPHS ABS: 2.3 10*3/uL (ref 0.7–4.0)
Lymphocytes Relative: 46 % (ref 12–46)
MCH: 26.8 pg (ref 26.0–34.0)
MCHC: 33.4 g/dL (ref 30.0–36.0)
MCV: 80.2 fL (ref 78.0–100.0)
MONO ABS: 0.4 10*3/uL (ref 0.1–1.0)
MONOS PCT: 7 % (ref 3–12)
MPV: 9.9 fL (ref 8.6–12.4)
NEUTROS ABS: 2.3 10*3/uL (ref 1.7–7.7)
NEUTROS PCT: 45 % (ref 43–77)
Platelets: 128 10*3/uL — ABNORMAL LOW (ref 150–400)
RBC: 4.7 MIL/uL (ref 3.87–5.11)
RDW: 16 % — ABNORMAL HIGH (ref 11.5–15.5)
WBC: 5.1 10*3/uL (ref 4.0–10.5)

## 2015-07-20 MED ORDER — HYDROCODONE-ACETAMINOPHEN 5-325 MG PO TABS: ORAL_TABLET | ORAL | Status: DC

## 2015-07-20 NOTE — Patient Instructions (Signed)
     IF you received an x-ray today, you will receive an invoice from [facility]. Please contact [facility] at [phone] with questions or concerns regarding your invoice.   IF you received labwork today, you will receive an invoice from [facility]. Please contact [facility] at [phone] with questions or concerns regarding your invoice.   Our billing staff will not be able to assist you with questions regarding bills from these companies.  You will be contacted with the lab results as soon as they are available. The fastest way to get your results is to activate your My Chart account. Instructions are located on the last page of this paperwork. If you have not heard from us regarding the results in 2 weeks, please contact this office.      

## 2015-07-20 NOTE — Progress Notes (Signed)
Subjective:    Patient ID: Carla Manning, female    DOB: 12/07/1934, 81 y.o.   MRN: [id number]  07/20/2015  Hypertension; Hyperlipidemia; Medication Refill; and clip toe nails   HPI This 81 y.o. female presents for four month follow-up:   1. HTN: Patient reports good compliance with medication, good tolerance to medication, and good symptom control.   BP checking; running not sure.  Does not check regularly.   2. Hyperlipidemia: Patient reports good compliance with medication, good tolerance to medication, and good symptom control.    3.  H/o CVA: Patient reports good compliance with medication, good tolerance to medication, and good symptom control.   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 ASA 376m after CVA. 08/2012, new onset seizures; placed on Keppra; MRI showed scattered L ACA infarcts; ASA changed to Plavix per SLeonie Man started on Lipitor; Keppra continued. Two CVAs. Wheel chair bound. No difficulties swallowing; neurology every six months.No incontinence. Lives with son.  4.  Partial seizure: Patient reports good compliance with medication, good tolerance to medication, and good symptom control.    5.  Neuropathy/occipital neuralgia: changed Gabapentin to tid.  Pain has elevated; having R sided facial burning.  Had one really bad day early in the morning. Daughter advised to give hydrocodone with good relief.  Gapapentin 3094mbid now.    Nocturia: wakes up at noon 12:00, 5:00pm, then 10:00pm; bedtime at 11:00pm; then urinates at 1:30am, 3:00am, 6:00am, 8:00am, 10:00am.  Eats supper around 6:00pm or 7;30pm; drinks tea or V8 splash or Gatorade or water. Has a big breakfast.  Leaking of urine only when sneezing.  Does not sleep well.  Had incontinence one night when she slept through.  Taking Gabapentin at 8:00am; takes the other at 8:00pm.  Taking hydrocodone 1/2 at 8:00am, 1/2 at 5:00pm, 2:00am one whole one.  Son stays with pt  Monday through Friday.  Daughter buys groceries and pays bills.  No longer cries with pain. Was taking 8:00am, 2:00pm, 8:00pm.    6. Hypothyroidism: Patient reports good compliance with medication, good tolerance to medication, and good symptom control.    7. Glucose intolerance:   8. Dystrophic nails: presenting for nail clipping.  9. 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  10.  Trigger finger: referred to hand surgery at prior visit.   Review of Systems  Constitutional: Negative for fever, chills, diaphoresis and fatigue.  Eyes: Negative for visual disturbance.  Respiratory: Negative for cough and shortness of breath.   Cardiovascular: Negative for chest pain, palpitations and leg swelling.  Gastrointestinal: Negative for nausea, vomiting, abdominal pain, diarrhea and constipation.  Endocrine: Negative for cold intolerance, heat intolerance, polydipsia, polyphagia and polyuria.  Neurological: Negative for dizziness, tremors, seizures, syncope, facial asymmetry, speech difficulty, weakness, light-headedness, numbness and headaches.    Past Medical History  Diagnosis Date  . Hypertension   . Hypothyroid   . Anxiety   . Depression   . Seizures ([facility]    post CVA  . St[facility] ([facility]    R sided weakness, aphasia  . Hyperlipidemia   . GERD (gastroesophageal reflux disease)    Past Surgical History  Procedure Laterality Date  . No past surgeries     Not on File Current Outpatient Prescriptions  Medication Sig Dispense Refill  . amLODipine (NORVASC) 10 MG tablet TAKE 1 TABLET BY MOUTH DIALY 30 tablet 5  . atorvastatin (LIPITOR) 80 MG tablet Take  1 tablet (80 mg total) by mouth daily. 30 tablet 5  . carvedilol (COREG) 6.25 MG tablet Take 1 tablet by mouth 2 times daily with a meal 60 tablet 5  . clopidogrel (PLAVIX) 75 MG tablet Take 1 tablet (75 mg total) by mouth daily. 30 tablet 11  . gabapentin (NEURONTIN) 100 MG capsule TAKE ONE CAPSULE BY MOUTH EVERY  MORNING AND 3 CAPSULES AT BEDTIME 120 capsule 4  . HYDROcodone-acetaminophen (NORCO/VICODIN) 5-325 MG tablet Take 1/2 - 1 tablet twice a day as needed for pain. 60 tablet 0  . levETIRAcetam (KEPPRA) 500 MG tablet Take 1 tablet by mouth daily every 12 hours 60 tablet 4  . levothyroxine (SYNTHROID, LEVOTHROID) 25 MCG tablet Take 1 tablet by mouth daily before breakfast 30 tablet 11  . naproxen sodium (ANAPROX) 220 MG tablet Take 440 mg by mouth 2 (two) times daily with a meal.    . ranitidine (ZANTAC) 150 MG capsule Take 150 mg by mouth daily.     No current facility-administered medications for this visit.   Social History   Social History  . Marital Status: Divorced    Spouse Name: N/A  . Number of Children: 12  . Years of Education: 6   Occupational History  . Retired    Social History Main Topics  . Smoking status: Never Smoker   . Smokeless tobacco: Never Used  . Alcohol Use: No  . Drug Use: No  . Sexual Activity: No   Other Topics Concern  . Not on file   Social History Narrative   Marital status: single/divorced.      Children: 12 children; 52 grandchildren; 29 gg.      Lives: with son;       Employment: retired/disability.  Scientist, product/process development.         Tobacco: none      Alcohol: none      ADLs: wheelchair bound; no incontinence; walks to bathroom; bathing self; needs some assistance with dressing; can brush teeth herself;no cooking; no driving.  No finances; no grocery shopping.  Caregiver on weekends Sunday and Monday at nights and all day.      Advanced Directives:  HCPOA [name] [phone]);  FULL CODE.     Patient is now left handed.   Patient has 6 th grade education.   Patient drinks 1 1/2 cup daily.   Family History  Problem Relation Age of Onset  . Diabetes Mother   . Hypertension Daughter   . Hypertension Daughter   . Diabetes Daughter        Objective:    BP 155/80 mmHg  Pulse 64  Temp(Src) 98.9 F (37.2 C) (Oral)  Resp  16 Physical Exam  Constitutional: She is oriented to person, place, and time. She appears well-developed and well-nourished. No distress.  HENT:  Head: Normocephalic and atraumatic.  Right Ear: External ear normal.  Left Ear: External ear normal.  Nose: Nose normal.  Mouth/Throat: Oropharynx is clear and moist.  Eyes: Conjunctivae and EOM are normal. Pupils are equal, round, and reactive to light.  Neck: Normal range of motion. Neck supple. Carotid bruit is not present. No thyromegaly present.  Cardiovascular: Normal rate, regular rhythm, normal heart sounds and intact distal pulses.  Exam reveals no gallop and no friction rub.   No murmur heard. Pulmonary/Chest: Effort normal and breath sounds normal. She has no wheezes. She has no rales.  Abdominal: Soft. Bowel sounds are normal. She exhibits no distension and no mass.  There is no tenderness. There is no rebound and no guarding.  Lymphadenopathy:    She has no cervical adenopathy.  Neurological: She is alert and oriented to person, place, and time. No cranial nerve deficit.  Skin: Skin is warm and dry. No rash noted. She is not diaphoretic. No erythema. No pallor.  Long large dystrophic hypertrophied hyperpigmented nails B feet.  Psychiatric: She has a normal mood and affect. Her behavior is normal.   Results for orders placed or performed in visit on 07/20/15  CBC with Differential/Platelet  Result Value Ref Range   WBC 5.1 4.0 - 10.5 K/uL   RBC 4.70 3.87 - 5.11 MIL/uL   Hemoglobin 12.6 12.0 - 15.0 g/dL   HCT 37.7 36.0 - 46.0 %   MCV 80.2 78.0 - 100.0 fL   MCH 26.8 26.0 - 34.0 pg   MCHC 33.4 30.0 - 36.0 g/dL   RDW 16.0 (H) 11.5 - 15.5 %   Platelets 128 (L) 150 - 400 K/uL   MPV 9.9 8.6 - 12.4 fL   Neutrophils Relative % 45 43 - 77 %   Neutro Abs 2.3 1.7 - 7.7 K/uL   Lymphocytes Relative 46 12 - 46 %   Lymphs Abs 2.3 0.7 - 4.0 K/uL   Monocytes Relative 7 3 - 12 %   Monocytes Absolute 0.4 0.1 - 1.0 K/uL   Eosinophils Relative  2 0 - 5 %   Eosinophils Absolute 0.1 0.0 - 0.7 K/uL   Basophils Relative 0 0 - 1 %   Basophils Absolute 0.0 0.0 - 0.1 K/uL   Smear Review Criteria for review not met   Comprehensive metabolic panel  Result Value Ref Range   Sodium 142 135 - 146 mmol/L   Potassium 4.4 3.5 - 5.3 mmol/L   Chloride 103 98 - 110 mmol/L   CO2 26 20 - 31 mmol/L   Glucose, Bld 99 65 - 99 mg/dL   BUN 14 7 - 25 mg/dL   Creat 1.12 (H) 0.60 - 0.93 mg/dL   Total Bilirubin 0.3 0.2 - 1.2 mg/dL   Alkaline Phosphatase 166 (H) 33 - 130 U/L   AST 23 10 - 35 U/L   ALT 14 6 - 29 U/L   Total Protein 7.6 6.1 - 8.1 g/dL   Albumin 4.0 3.6 - 5.1 g/dL   Calcium 8.9 8.6 - 10.4 mg/dL  Lipid panel  Result Value Ref Range   Cholesterol 141 125 - 200 mg/dL   Triglycerides 70 <150 mg/dL   HDL 67 >=46 mg/dL   Total CHOL/HDL Ratio 2.1 <=5.0 Ratio   VLDL 14 <30 mg/dL   LDL Cholesterol 60 <130 mg/dL   PROCEDURE: TOENAILS TRIMMED DURING VISIT.    Assessment & Plan:   1. Essential hypertension   2. Vitamin B6 deficiency neuropathy   3. Bilateral occipital neuralgia   4. HLD (hyperlipidemia)   5. Hypothyroidism due to acquired atrophy of thyroid   6. Gastroesophageal reflux disease without esophagitis   7. Partial seizure ([place])   8. History of CVA with residual deficit   9. Dystrophic nail     Orders Placed This Encounter  Procedures  . CBC with Differential/Platelet  . Comprehensive metabolic panel    Order Specific Question:  Has the patient fasted?    Answer:  Yes  . Lipid panel    Order Specific Question:  Has the patient fasted?    Answer:  Yes  . Ambulatory referral to Podiatry    Referral  Priority:  Routine    Referral Type:  Consultation    Referral Reason:  Specialty Services Required    Requested Specialty:  Podiatry    Number of Visits Requested:  1   Meds ordered this encounter  Medications  . HYDROcodone-acetaminophen (NORCO/VICODIN) 5-325 MG tablet    Sig: Take 1/2 - 1 tablet twice a day as  needed for pain.    Dispense:  60 tablet    Refill:  0    Return in about 4 months (around 11/19/2015) for recheck.    Prarthana Parlin Elayne Guerin, M.D. [facility] [address] [place], [place]  [address] [phone] phone [phone] fax

## 2015-07-21 LAB — COMPREHENSIVE METABOLIC PANEL
ALBUMIN: 4 g/dL (ref 3.6–5.1)
ALT: 14 U/L (ref 6–29)
AST: 23 U/L (ref 10–35)
Alkaline Phosphatase: 166 U/L — ABNORMAL HIGH (ref 33–130)
BUN: 14 mg/dL (ref 7–25)
CALCIUM: 8.9 mg/dL (ref 8.6–10.4)
CHLORIDE: 103 mmol/L (ref 98–110)
CO2: 26 mmol/L (ref 20–31)
CREATININE: 1.12 mg/dL — AB (ref 0.60–0.93)
Glucose, Bld: 99 mg/dL (ref 65–99)
POTASSIUM: 4.4 mmol/L (ref 3.5–5.3)
Sodium: 142 mmol/L (ref 135–146)
TOTAL PROTEIN: 7.6 g/dL (ref 6.1–8.1)
Total Bilirubin: 0.3 mg/dL (ref 0.2–1.2)

## 2015-07-21 LAB — LIPID PANEL
CHOL/HDL RATIO: 2.1 ratio (ref <=5.0)
CHOLESTEROL: 141 mg/dL (ref 125–200)
HDL: 67 mg/dL (ref >=46)
LDL Cholesterol: 60 mg/dL (ref <130)
TRIGLYCERIDES: 70 mg/dL (ref <150)
VLDL: 14 mg/dL (ref <30)

## 2015-08-10 ENCOUNTER — Encounter: Payer: Self-pay | Admitting: Family Medicine

## 2015-08-17 ENCOUNTER — Telehealth: Payer: Self-pay

## 2015-08-17 MED ORDER — HYDROCODONE-ACETAMINOPHEN 5-325 MG PO TABS: ORAL_TABLET | ORAL | Status: DC

## 2015-08-17 NOTE — Telephone Encounter (Signed)
Pt is needing a refill on hydrocodone    Best number is [phone]

## 2015-08-17 NOTE — Telephone Encounter (Signed)
Call --- hydrocodone is ready for pick up at 104.

## 2015-08-18 NOTE — Telephone Encounter (Signed)
Left message rx ready to pick up

## 2015-09-01 ENCOUNTER — Other Ambulatory Visit: Payer: Self-pay | Admitting: Family Medicine

## 2015-09-14 ENCOUNTER — Telehealth: Payer: Self-pay

## 2015-09-14 NOTE — Telephone Encounter (Signed)
Patient's daughter is calling to request a refill for hydrocodone. [phone]

## 2015-09-15 ENCOUNTER — Telehealth: Payer: Self-pay | Admitting: Emergency Medicine

## 2015-09-15 MED ORDER — HYDROCODONE-ACETAMINOPHEN 5-325 MG PO TABS: ORAL_TABLET | ORAL | Status: DC

## 2015-09-15 NOTE — Telephone Encounter (Signed)
Daughter notified she can pick Rx at front desk per Dr. Katrinka BlazingSmith

## 2015-09-15 NOTE — Telephone Encounter (Signed)
Please call --- rx for hydrocodone ready for pick up.

## 2015-10-02 ENCOUNTER — Other Ambulatory Visit: Payer: Self-pay | Admitting: Family Medicine

## 2015-10-14 ENCOUNTER — Other Ambulatory Visit: Payer: Self-pay

## 2015-10-14 NOTE — Telephone Encounter (Signed)
Pt is needing a refill on her hydrocodone  Best number [phone]

## 2015-10-18 ENCOUNTER — Telehealth: Payer: Self-pay | Admitting: Physician Assistant

## 2015-10-18 MED ORDER — HYDROCODONE-ACETAMINOPHEN 5-325 MG PO TABS: ORAL_TABLET | ORAL | Status: DC

## 2015-10-18 NOTE — Telephone Encounter (Signed)
Meds ordered this encounter  Medications  . HYDROcodone-acetaminophen (NORCO/VICODIN) 5-325 MG tablet    Sig: Take 1/2 - 1 tablet twice a day as needed for pain.    Dispense:  60 tablet    Refill:  0    Order Specific Question:  Supervising Provider    Answer:  DOOLITTLE, ROBERT P [[id number]]

## 2015-10-28 NOTE — Telephone Encounter (Signed)
Refilled by Porfirio Oarhelle Jeffery, PA-C on 10/18/2015.

## 2015-11-11 ENCOUNTER — Telehealth: Payer: Self-pay

## 2015-11-11 NOTE — Telephone Encounter (Signed)
Pt is needing a refill on hydrocodone   Best number to call once ready to pick up is [phone]

## 2015-11-15 NOTE — Telephone Encounter (Signed)
Patient's daughter is calling to follow up on refill request. Please call when ready!

## 2015-11-17 ENCOUNTER — Other Ambulatory Visit: Payer: Self-pay | Admitting: Family Medicine

## 2015-11-17 MED ORDER — HYDROCODONE-ACETAMINOPHEN 5-325 MG PO TABS: ORAL_TABLET | ORAL | Status: DC

## 2015-11-17 NOTE — Telephone Encounter (Signed)
Advised Rx ready to pick up. 

## 2015-11-17 NOTE — Telephone Encounter (Signed)
Hydrocodone is ready for pick up.

## 2015-11-26 ENCOUNTER — Other Ambulatory Visit: Payer: Self-pay | Admitting: Family Medicine

## 2015-11-29 NOTE — Telephone Encounter (Signed)
Dr Katrinka Blazing, I don't believe this is the correct Rx for gabapentin, but wanted to check with you to clarify correct dose. At 3/21 OV you wrote following in subjective data: Neuropathy/occipital neuralgia: changed Gabapentin to tid.  Pain has elevated; having R sided facial burning.  Had one really bad day early in the morning. Daughter advised to give hydrocodone with good relief.  Gapapentin 300mg  bid now.    But nothing was written about dose change under the plan section, and the med list still had the strength / dose listed as the pharmacy sent. I will pend the Rx the way I am reading it above in your notes, for 300 mg TID, and will also leave pended Rx from pharm. Please send in correct one if OK.

## 2015-11-30 ENCOUNTER — Ambulatory Visit: Payer: Medicare Other | Admitting: Family Medicine

## 2015-12-01 NOTE — Telephone Encounter (Signed)
Approved 100mg  Gabapentin for patient since she has been receiving this dose for the past two months.  Please call and clarify with daughter if she was aware of the change?

## 2015-12-01 NOTE — Telephone Encounter (Signed)
Tried to call pt's daughter (or pt) and the only number we have for either/both had a VM that was full and I could not LM. Called pharm to get report of what dose pt has been getting filled recently. Pharm stated that going all the way back to July 2016 pt has been taking what we just approved, the 100 mg, 1 Qam and 3 tabs Qhs. So this does appear to be the correct dose.

## 2015-12-03 ENCOUNTER — Other Ambulatory Visit: Payer: Self-pay | Admitting: Family Medicine

## 2015-12-15 ENCOUNTER — Encounter: Payer: Self-pay | Admitting: Family Medicine

## 2015-12-15 ENCOUNTER — Ambulatory Visit (INDEPENDENT_AMBULATORY_CARE_PROVIDER_SITE_OTHER): Payer: Medicare Other | Admitting: Family Medicine

## 2015-12-15 VITALS — BP 140/82 | HR 59 | Temp 98.8°F | Resp 18 | Ht 60.0 in

## 2015-12-15 DIAGNOSIS — E034 Atrophy of thyroid (acquired): Secondary | ICD-10-CM

## 2015-12-15 DIAGNOSIS — E785 Hyperlipidemia, unspecified: Secondary | ICD-10-CM

## 2015-12-15 DIAGNOSIS — I672 Cerebral atherosclerosis: Secondary | ICD-10-CM | POA: Diagnosis not present

## 2015-12-15 DIAGNOSIS — K219 Gastro-esophageal reflux disease without esophagitis: Secondary | ICD-10-CM | POA: Diagnosis not present

## 2015-12-15 DIAGNOSIS — E531 Pyridoxine deficiency: Secondary | ICD-10-CM | POA: Diagnosis not present

## 2015-12-15 DIAGNOSIS — E038 Other specified hypothyroidism: Secondary | ICD-10-CM

## 2015-12-15 DIAGNOSIS — R569 Unspecified convulsions: Secondary | ICD-10-CM

## 2015-12-15 DIAGNOSIS — G609 Hereditary and idiopathic neuropathy, unspecified: Secondary | ICD-10-CM

## 2015-12-15 DIAGNOSIS — Z8673 Personal history of transient ischemic attack (TIA), and cerebral infarction without residual deficits: Secondary | ICD-10-CM | POA: Diagnosis not present

## 2015-12-15 DIAGNOSIS — I10 Essential (primary) hypertension: Secondary | ICD-10-CM | POA: Diagnosis not present

## 2015-12-15 DIAGNOSIS — I6930 Unspecified sequelae of cerebral infarction: Secondary | ICD-10-CM

## 2015-12-15 LAB — COMPREHENSIVE METABOLIC PANEL
ALBUMIN: 3.8 g/dL (ref 3.6–5.1)
ALT: 16 U/L (ref 6–29)
AST: 30 U/L (ref 10–35)
Alkaline Phosphatase: 124 U/L (ref 33–130)
BUN: 15 mg/dL (ref 7–25)
CALCIUM: 8.7 mg/dL (ref 8.6–10.4)
CHLORIDE: 104 mmol/L (ref 98–110)
CO2: 27 mmol/L (ref 20–31)
CREATININE: 1.19 mg/dL — AB (ref 0.60–0.93)
Glucose, Bld: 138 mg/dL — ABNORMAL HIGH (ref 65–99)
Potassium: 4.1 mmol/L (ref 3.5–5.3)
SODIUM: 140 mmol/L (ref 135–146)
TOTAL PROTEIN: 7.4 g/dL (ref 6.1–8.1)
Total Bilirubin: 0.4 mg/dL (ref 0.2–1.2)

## 2015-12-15 LAB — LIPID PANEL
CHOLESTEROL: 113 mg/dL — AB (ref 125–200)
HDL: 61 mg/dL (ref >=46)
LDL Cholesterol: 39 mg/dL (ref <130)
TRIGLYCERIDES: 65 mg/dL (ref <150)
Total CHOL/HDL Ratio: 1.9 Ratio (ref <=5.0)
VLDL: 13 mg/dL (ref <30)

## 2015-12-15 MED ORDER — PREGABALIN 25 MG PO CAPS: 25.0000 mg | ORAL_CAPSULE | Freq: Two times a day (BID) | ORAL | 5 refills | Status: DC

## 2015-12-15 MED ORDER — HYDROCODONE-ACETAMINOPHEN 5-325 MG PO TABS: ORAL_TABLET | ORAL | 0 refills | Status: DC

## 2015-12-15 MED ORDER — ZOSTER VACCINE LIVE 19400 UNT/0.65ML ~~LOC~~ SUSR: 0.6500 mL | Freq: Once | SUBCUTANEOUS | 0 refills | Status: AC

## 2015-12-15 NOTE — Progress Notes (Signed)
Subjective:    Patient ID: Carla RidgesEthel B Fontes, female    DOB: [birth year], 81 y.o.   MRN: [id number]  12/15/2015  Medication Refill (ALL MEDS)   HPI This 81 y.o. female presents for four month follow-up:  1. HTN:  Patient reports good compliance with medication, good tolerance to medication, and good symptom control.   Amlodipine 10mg  daily. Carvedilol 6.25mg  bid.  2.  Hypercholesterolemia:  Patient reports good compliance with medication, good tolerance to medication, and good symptom control.   Lipitor 80mg  daily.  3.  History of CVA/partial seizure:  Wants to return for discussion of stopping Keppra. Taking Plavix.  4. Hypothyroidism: Patient reports good compliance with medication, good tolerance to medication, and good symptom control.    Synthroid 25mcg daily.  5. GERD: Patient reports good compliance with medication, good tolerance to medication, and good symptom control.  Zantac daily.  6.  Peripheral neuropathy: worsening symptoms.  Gabapentin 100mg  now.  Suffered with urinary urgency with increased doses of Gabapentin.   Review of Systems  Constitutional: Negative for chills, diaphoresis, fatigue and fever.  Eyes: Negative for visual disturbance.  Respiratory: Negative for cough and shortness of breath.   Cardiovascular: Negative for chest pain, palpitations and leg swelling.  Gastrointestinal: Negative for abdominal pain, constipation, diarrhea, nausea and vomiting.  Endocrine: Negative for cold intolerance, heat intolerance, polydipsia, polyphagia and polyuria.  Neurological: Positive for weakness and numbness. Negative for dizziness, tremors, seizures, syncope, facial asymmetry, speech difficulty, light-headedness and headaches.    Past Medical History:  Diagnosis Date  . Anxiety   . Depression   . GERD (gastroesophageal reflux disease)   . Hyperlipidemia   . Hypertension   . Hypothyroid   . Seizures ([facility])    post CVA  . Stroke [facility])    R sided weakness,  aphasia   Past Surgical History:  Procedure Laterality Date  . NO PAST SURGERIES     No Known Allergies  Social History   Social History  . Marital status: Divorced    Spouse name: N/A  . Number of children: 12  . Years of education: 6   Occupational History  . Retired Not Employed   Social History Main Topics  . Smoking status: Never Smoker  . Smokeless tobacco: Never Used  . Alcohol use No  . Drug use: No  . Sexual activity: No   Other Topics Concern  . Not on file   Social History Narrative   Marital status: single/divorced.      Children: 12 children; 52 grandchildren; 13 gg.      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         Tobacco: none      Alcohol: none      ADLs: wheelchair bound; no incontinence; walks to bathroom; bathing self; needs some assistance with dressing; can brush teeth herself;no cooking; no driving.  No finances; no grocery shopping.  Caregiver on weekends Sunday and Monday at nights and all day.      Advanced Directives:  HCPOA [name]([name] Summers [phone]);  FULL CODE.     Patient is now left handed.   Patient has 6 th grade education.   Patient drinks 1 1/2 cup daily.   Family History  Problem Relation Age of Onset  . Diabetes Mother   . Hypertension Daughter   . Hypertension Daughter   . Diabetes Daughter        Objective:  BP 140/82 (BP Location: Right Arm, Patient Position: Sitting, Cuff Size: Large)   Pulse (!) 59   Temp 98.8 F (37.1 C) (Oral)   Resp 18   Ht 5' (1.524 m)   SpO2 96%  Physical Exam  Constitutional: She is oriented to person, place, and time. She appears well-developed and well-nourished. No distress.  HENT:  Head: Normocephalic and atraumatic.  Right Ear: External ear normal.  Left Ear: External ear normal.  Nose: Nose normal.  Mouth/Throat: Oropharynx is clear and moist.  Eyes: Conjunctivae and EOM are normal. Pupils are equal, round, and reactive to  light.  Neck: Normal range of motion. Neck supple. Carotid bruit is not present. No thyromegaly present.  Cardiovascular: Normal rate, regular rhythm, normal heart sounds and intact distal pulses.  Exam reveals no gallop and no friction rub.   No murmur heard. Pulmonary/Chest: Effort normal and breath sounds normal. She has no wheezes. She has no rales.  Abdominal: Soft. Bowel sounds are normal. She exhibits no distension and no mass. There is no tenderness. There is no rebound and no guarding.  Lymphadenopathy:    She has no cervical adenopathy.  Neurological: She is alert and oriented to person, place, and time. No cranial nerve deficit.  R sided weakness of RUE, RLE.  Skin: Skin is warm and dry. No rash noted. She is not diaphoretic. No erythema. No pallor.  Psychiatric: She has a normal mood and affect. Her behavior is normal.        Assessment & Plan:   1. Essential hypertension   2. Gastroesophageal reflux disease without esophagitis   3. Hypothyroidism due to acquired atrophy of thyroid   4. Partial seizure ([facility])   5. Vitamin B6 deficiency neuropathy   6. Hereditary and idiopathic peripheral neuropathy   7. HLD (hyperlipidemia)   8. History of CVA with residual deficit    -rx for Lyrica since intolerant to higher doses of Gabapentin.   -refer back to neurology to determine if Keppra still warranted and to follow-up regarding neuropathy pain. -obtain labs. -continue current medications;  -refill of hydrocodone provided.   Orders Placed This Encounter  Procedures  . CBC with Differential/Platelet  . Comprehensive metabolic panel    Order Specific Question:   Has the patient fasted?    Answer:   Yes  . Lipid panel    Order Specific Question:   Has the patient fasted?    Answer:   Yes  . Ambulatory referral to Neurology    Referral Priority:   Routine    Referral Type:   Consultation    Referral Reason:   Specialty Services Required    Requested Specialty:    Neurology    Number of Visits Requested:   1   Meds ordered this encounter  Medications  . pregabalin (LYRICA) 25 MG capsule    Sig: Take 1 capsule (25 mg total) by mouth 2 (two) times daily.    Dispense:  60 capsule    Refill:  5  . Zoster Vaccine Live, PF, (ZOSTAVAX) [id number] UNT/0.65ML injection    Sig: Inject 19,400 Units into the skin once.    Dispense:  0.65 mL    Refill:  0  . HYDROcodone-acetaminophen (NORCO/VICODIN) 5-325 MG tablet    Sig: Take 1/2 - 1 tablet twice a day as needed for pain.    Dispense:  60 tablet    Refill:  0    Return in about 4 months (around 04/15/2016) for recheck.  Uriyah Raska Elayne Guerin, M.D. [facility] [address] [place], [place]  [address] [phone] phone [phone] fax

## 2015-12-15 NOTE — Patient Instructions (Signed)
     IF you received an x-ray today, you will receive an invoice from [facility]. Please contact [facility] at [phone] with questions or concerns regarding your invoice.   IF you received labwork today, you will receive an invoice from [facility]. Please contact [facility] at [phone] with questions or concerns regarding your invoice.   Our billing staff will not be able to assist you with questions regarding bills from these companies.  You will be contacted with the lab results as soon as they are available. The fastest way to get your results is to activate your My Chart account. Instructions are located on the last page of this paperwork. If you have not heard from us regarding the results in 2 weeks, please contact this office.      

## 2015-12-16 LAB — CBC WITH DIFFERENTIAL/PLATELET
Basophils Absolute: 0 cells/uL (ref 0–200)
Basophils Relative: 0 %
EOS PCT: 3 %
Eosinophils Absolute: 156 cells/uL (ref 15–500)
HEMATOCRIT: 37.6 % (ref 35.0–45.0)
HEMOGLOBIN: 12.9 g/dL (ref 11.7–15.5)
LYMPHS ABS: 2652 {cells}/uL (ref 850–3900)
LYMPHS PCT: 51 %
MCH: 28.6 pg (ref 27.0–33.0)
MCHC: 34.3 g/dL (ref 32.0–36.0)
MCV: 83.4 fL (ref 80.0–100.0)
MONOS PCT: 7 %
MPV: 10.3 fL (ref 7.5–12.5)
Monocytes Absolute: 364 cells/uL (ref 200–950)
NEUTROS PCT: 39 %
Neutro Abs: 2028 cells/uL (ref 1500–7800)
Platelets: 124 10*3/uL — ABNORMAL LOW (ref 140–400)
RBC: 4.51 MIL/uL (ref 3.80–5.10)
RDW: 16.8 % — ABNORMAL HIGH (ref 11.0–15.0)
WBC: 5.2 10*3/uL (ref 3.8–10.8)

## 2015-12-27 ENCOUNTER — Other Ambulatory Visit: Payer: Self-pay | Admitting: Family Medicine

## 2016-01-05 ENCOUNTER — Other Ambulatory Visit: Payer: Self-pay | Admitting: Family Medicine

## 2016-01-17 ENCOUNTER — Other Ambulatory Visit: Payer: Self-pay

## 2016-01-17 NOTE — Telephone Encounter (Signed)
Patient request a refill of HYDROcodone-acetaminophen (NORCO/VICODIN) 5-325 MG tablet °

## 2016-01-17 NOTE — Telephone Encounter (Signed)
Last RF on 8/16. Pended.

## 2016-01-18 MED ORDER — HYDROCODONE-ACETAMINOPHEN 5-325 MG PO TABS: ORAL_TABLET | ORAL | 0 refills | Status: DC

## 2016-01-18 NOTE — Telephone Encounter (Signed)
Left VM for patient to call back. Patient rx is up for pick up at front desk.

## 2016-01-18 NOTE — Telephone Encounter (Signed)
Call family --- hydrocodone rx ready for pick up.

## 2016-01-19 NOTE — Telephone Encounter (Signed)
LMOM that Rx is ready for p/up and not CB is necessary unless they have any ?s.

## 2016-02-14 ENCOUNTER — Other Ambulatory Visit: Payer: Self-pay

## 2016-02-14 NOTE — Telephone Encounter (Signed)
Pt requesting Hydrochodone refill   Best phone for pt is [phone]

## 2016-02-17 NOTE — Telephone Encounter (Signed)
Please advise 

## 2016-02-17 NOTE — Telephone Encounter (Signed)
Pt checking on status of this call. Please advise at [phone]

## 2016-02-18 ENCOUNTER — Telehealth: Payer: Self-pay | Admitting: Emergency Medicine

## 2016-02-18 MED ORDER — HYDROCODONE-ACETAMINOPHEN 5-325 MG PO TABS: ORAL_TABLET | ORAL | 0 refills | Status: DC

## 2016-02-18 NOTE — Telephone Encounter (Signed)
Patient is completely out of her HYDROcodone-acetaminophen (NORCO/VICODIN) 5-325 MG tablet and shes has been calling it in since 02/14/16 with no answer, she does see Dr Katrinka BlazingSmith and she is out of the office for awhile can someone else please refill this for her.  Her call back number is [phone]

## 2016-02-18 NOTE — Telephone Encounter (Signed)
Dr. Katrinka BlazingSmith, Can another provider RF Hydrocodone? Please f/u

## 2016-02-18 NOTE — Telephone Encounter (Signed)
OK to refill hydrocodone for neuropathy pain since I am out of the office this week.  Thanks.

## 2016-02-18 NOTE — Telephone Encounter (Signed)
Ordered for pick up

## 2016-02-21 NOTE — Telephone Encounter (Signed)
Verified that Rx was in drawer and LMOM for daughter that it is ready for p/up. Apologized that her mother has been without this med for several days.

## 2016-03-06 ENCOUNTER — Other Ambulatory Visit: Payer: Self-pay | Admitting: Physician Assistant

## 2016-03-08 ENCOUNTER — Telehealth: Payer: Self-pay

## 2016-03-08 ENCOUNTER — Other Ambulatory Visit: Payer: Self-pay | Admitting: Emergency Medicine

## 2016-03-08 MED ORDER — LEVETIRACETAM 500 MG PO TABS: 500.0000 mg | ORAL_TABLET | Freq: Two times a day (BID) | ORAL | 0 refills | Status: DC

## 2016-03-08 MED ORDER — CARVEDILOL 6.25 MG PO TABS: 6.2500 mg | ORAL_TABLET | Freq: Two times a day (BID) | ORAL | 0 refills | Status: DC

## 2016-03-08 MED ORDER — ATORVASTATIN CALCIUM 80 MG PO TABS: 80.0000 mg | ORAL_TABLET | Freq: Every day | ORAL | 3 refills | Status: DC

## 2016-03-08 MED ORDER — CLOPIDOGREL BISULFATE 75 MG PO TABS: 75.0000 mg | ORAL_TABLET | Freq: Every day | ORAL | 11 refills | Status: DC

## 2016-03-08 MED ORDER — AMLODIPINE BESYLATE 10 MG PO TABS: 10.0000 mg | ORAL_TABLET | Freq: Every day | ORAL | 3 refills | Status: DC

## 2016-03-08 NOTE — Telephone Encounter (Signed)
Medication reordered and e-scribed to [facility] Atorvastatin Carvedilol Plavix Keppra Amlodopine

## 2016-03-08 NOTE — Telephone Encounter (Signed)
Pharmacy is calling on behalf of patient.  They are calling to follow up on atorvastatin.  They also faxed over 5 other medications for this patient as well.  Please advise.

## 2016-03-08 NOTE — Telephone Encounter (Signed)
11/2015 last ov and labs  

## 2016-03-16 ENCOUNTER — Telehealth: Payer: Self-pay

## 2016-03-16 NOTE — Telephone Encounter (Signed)
Patient is requesting a refill of her hydrocodone medication.  Patient sees Dr. Katrinka BlazingSmith.  Please advise  [phone]

## 2016-03-17 NOTE — Telephone Encounter (Signed)
Please advise 

## 2016-03-20 MED ORDER — HYDROCODONE-ACETAMINOPHEN 5-325 MG PO TABS: ORAL_TABLET | ORAL | 0 refills | Status: DC

## 2016-03-20 NOTE — Telephone Encounter (Signed)
Call -- hydrocodone rx ready for pick up. 

## 2016-03-21 NOTE — Telephone Encounter (Signed)
Pt. Advised ready for pick up .

## 2016-03-28 ENCOUNTER — Other Ambulatory Visit: Payer: Self-pay | Admitting: Physician Assistant

## 2016-04-07 ENCOUNTER — Encounter: Payer: Self-pay | Admitting: Family Medicine

## 2016-04-17 ENCOUNTER — Telehealth: Payer: Self-pay

## 2016-04-17 NOTE — Telephone Encounter (Signed)
Pt is needing a refill on her hydrocodone  Best number [phone]

## 2016-04-18 NOTE — Telephone Encounter (Signed)
03/20/16 last refill 11/2015 last ov

## 2016-04-20 MED ORDER — HYDROCODONE-ACETAMINOPHEN 5-325 MG PO TABS: ORAL_TABLET | ORAL | 0 refills | Status: DC

## 2016-04-20 NOTE — Telephone Encounter (Signed)
Lm rx is ready for pick up.

## 2016-04-20 NOTE — Telephone Encounter (Signed)
Call -- -hydrocodone ready for pick up. 

## 2016-04-27 ENCOUNTER — Other Ambulatory Visit: Payer: Self-pay | Admitting: Physician Assistant

## 2016-05-09 ENCOUNTER — Telehealth: Payer: Self-pay | Admitting: Family Medicine

## 2016-05-09 ENCOUNTER — Ambulatory Visit: Payer: Medicare Other | Admitting: Family Medicine

## 2016-05-09 NOTE — Telephone Encounter (Signed)
Pt daughter calling to see if she can take something else other that the mucinex in the green and white box its making her have diarrhea and want to know if she can take something else

## 2016-05-11 NOTE — Telephone Encounter (Signed)
Recommend patient try Delsym for cough.

## 2016-05-13 NOTE — Telephone Encounter (Signed)
Advised daughter to take Delsym for cough. States, cough has improved and wheezing resolved.

## 2016-05-16 ENCOUNTER — Other Ambulatory Visit: Payer: Self-pay | Admitting: Family Medicine

## 2016-05-16 ENCOUNTER — Ambulatory Visit: Payer: Medicare Other | Admitting: Family Medicine

## 2016-05-16 NOTE — Telephone Encounter (Signed)
Pt daughter calling for a RX of moms NORCO please respond

## 2016-05-17 NOTE — Telephone Encounter (Signed)
04/20/16 last refill 11/2015 last ov

## 2016-05-23 ENCOUNTER — Ambulatory Visit (INDEPENDENT_AMBULATORY_CARE_PROVIDER_SITE_OTHER): Payer: Medicare Other | Admitting: Family Medicine

## 2016-05-23 ENCOUNTER — Encounter: Payer: Self-pay | Admitting: Family Medicine

## 2016-05-23 VITALS — BP 133/88 | HR 58 | Temp 99.3°F | Resp 16

## 2016-05-23 DIAGNOSIS — H6123 Impacted cerumen, bilateral: Secondary | ICD-10-CM | POA: Diagnosis not present

## 2016-05-23 DIAGNOSIS — K219 Gastro-esophageal reflux disease without esophagitis: Secondary | ICD-10-CM

## 2016-05-23 DIAGNOSIS — M5481 Occipital neuralgia: Secondary | ICD-10-CM

## 2016-05-23 DIAGNOSIS — E7800 Pure hypercholesterolemia, unspecified: Secondary | ICD-10-CM | POA: Diagnosis not present

## 2016-05-23 DIAGNOSIS — I10 Essential (primary) hypertension: Secondary | ICD-10-CM

## 2016-05-23 DIAGNOSIS — B370 Candidal stomatitis: Secondary | ICD-10-CM | POA: Diagnosis not present

## 2016-05-23 DIAGNOSIS — E034 Atrophy of thyroid (acquired): Secondary | ICD-10-CM

## 2016-05-23 DIAGNOSIS — R569 Unspecified convulsions: Secondary | ICD-10-CM

## 2016-05-23 DIAGNOSIS — G609 Hereditary and idiopathic neuropathy, unspecified: Secondary | ICD-10-CM

## 2016-05-23 DIAGNOSIS — R7302 Impaired glucose tolerance (oral): Secondary | ICD-10-CM | POA: Diagnosis not present

## 2016-05-23 DIAGNOSIS — Z23 Encounter for immunization: Secondary | ICD-10-CM

## 2016-05-23 DIAGNOSIS — I6930 Unspecified sequelae of cerebral infarction: Secondary | ICD-10-CM

## 2016-05-23 DIAGNOSIS — B9789 Other viral agents as the cause of diseases classified elsewhere: Secondary | ICD-10-CM

## 2016-05-23 DIAGNOSIS — J069 Acute upper respiratory infection, unspecified: Secondary | ICD-10-CM

## 2016-05-23 MED ORDER — CARVEDILOL 6.25 MG PO TABS: 6.2500 mg | ORAL_TABLET | Freq: Two times a day (BID) | ORAL | 1 refills | Status: DC

## 2016-05-23 MED ORDER — ATORVASTATIN CALCIUM 80 MG PO TABS: 80.0000 mg | ORAL_TABLET | Freq: Every day | ORAL | 3 refills | Status: DC

## 2016-05-23 MED ORDER — NYSTATIN 100000 UNIT/ML MT SUSP: 5.0000 mL | Freq: Four times a day (QID) | OROMUCOSAL | 0 refills | Status: DC

## 2016-05-23 MED ORDER — AMLODIPINE BESYLATE 10 MG PO TABS: 10.0000 mg | ORAL_TABLET | Freq: Every day | ORAL | 3 refills | Status: DC

## 2016-05-23 MED ORDER — LEVOTHYROXINE SODIUM 25 MCG PO TABS: ORAL_TABLET | ORAL | 3 refills | Status: DC

## 2016-05-23 MED ORDER — CLOPIDOGREL BISULFATE 75 MG PO TABS: 75.0000 mg | ORAL_TABLET | Freq: Every day | ORAL | 3 refills | Status: DC

## 2016-05-23 MED ORDER — GABAPENTIN 100 MG PO CAPS: ORAL_CAPSULE | ORAL | 1 refills | Status: DC

## 2016-05-23 MED ORDER — LEVETIRACETAM 500 MG PO TABS: 500.0000 mg | ORAL_TABLET | Freq: Two times a day (BID) | ORAL | 1 refills | Status: DC

## 2016-05-23 MED ORDER — HYDROCODONE-ACETAMINOPHEN 5-325 MG PO TABS: ORAL_TABLET | ORAL | 0 refills | Status: DC

## 2016-05-23 NOTE — Progress Notes (Signed)
Subjective:    Patient ID: Carla Manning, female    DOB: 1934/10/13, 82 y.o.   MRN: [id number]  05/23/2016  Cough (WITH WHEEZING x 3 weeks) and Medication Refill (Hydrocodone-acetamionphen)   HPI This 82 y.o. female presents for evaluation of coughing with wheezing and chronic medical follow-up for hypertension, hypercholesterolemia, hypothyroidism, and glucose intolerance.  Started Mucinex and caused diarrhea; then switched to cough syrup and had diarrhea.  Has wheezing that is much better; has been wheezing for two weeks but no recent wheezing in two days. No fever/chills/sweats.  No headache. No ear pain but decreased hearing.  +nasal congestion.  No rhinorrhea; +coughing.    Immunization History  Administered Date(s) Administered  . Influenza,inj,Quad PF,36+ Mos 04/08/2013, 03/24/2014, 03/08/2015, 05/23/2016  . Pneumococcal Conjugate-13 11/04/2014  . Pneumococcal Polysaccharide-23 11/24/2010, 08/29/2012   BP Readings from Last 3 Encounters:  05/23/16 133/88  12/15/15 140/82  07/20/15 (!) 155/80    HTN: Patient reports good compliance with medication, good tolerance to medication, and good symptom control.  Not checking.  Hypercholesterolemia: Patient reports good compliance with medication, good tolerance to medication, and good symptom control.    Neuropathy: taking Gabapentin 1 every morning and 1 at 8:00pm.  No insurance coverage for rx.  Hydrocodone bid.    Hypothyroidism: Patient reports good compliance with medication, good tolerance to medication, and good symptom control.    History of CVA: Patient reports good compliance with medication, good tolerance to medication, and good symptom control.    Review of Systems  Constitutional: Negative for chills, diaphoresis, fatigue and fever.  Eyes: Negative for visual disturbance.  Respiratory: Positive for cough and wheezing. Negative for shortness of breath.   Cardiovascular: Negative for chest pain, palpitations and leg  swelling.  Gastrointestinal: Negative for abdominal pain, constipation, diarrhea, nausea and vomiting.  Endocrine: Negative for cold intolerance, heat intolerance, polydipsia, polyphagia and polyuria.  Neurological: Positive for weakness and numbness. Negative for dizziness, tremors, seizures, syncope, facial asymmetry, speech difficulty, light-headedness and headaches.  Psychiatric/Behavioral: Negative for dysphoric mood. The patient is not nervous/anxious.     Past Medical History:  Diagnosis Date  . Anxiety   . Depression   . GERD (gastroesophageal reflux disease)   . Hyperlipidemia   . Hypertension   . Hypothyroid   . Seizures (HCC)    post CVA  . Stroke [facility](HCC)    R sided weakness, aphasia   Past Surgical History:  Procedure Laterality Date  . NO PAST SURGERIES     Not on File  Social History   Social History  . Marital status: Divorced    Spouse name: N/A  . Number of children: 12  . Years of education: 6   Occupational History  . Retired Not Employed   Social History Main Topics  . Smoking status: Never Smoker  . Smokeless tobacco: Never Used  . Alcohol use No  . Drug use: No  . Sexual activity: No   Other Topics Concern  . Not on file   Social History Narrative   Marital status: single/divorced.      Children: 12 children; 52 grandchildren; 13 gg.      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         Tobacco: none      Alcohol: none      ADLs: wheelchair bound; no incontinence; walks to bathroom; bathing self; needs some assistance with dressing; can brush teeth herself;no cooking; no  driving.  No finances; no grocery shopping.  Caregiver on weekends Sunday and Monday at nights and all day.      Advanced Directives:  HCPOA [name] [phone]);  FULL CODE.     Patient is now left handed.   Patient has 6 th grade education.   Patient drinks 1 1/2 cup daily.   Family History  Problem Relation Age of  Onset  . Diabetes Mother   . Hypertension Daughter   . Hypertension Daughter   . Diabetes Daughter        Objective:    BP 133/88 (BP Location: Left Arm, Patient Position: Sitting, Cuff Size: Large)   Pulse (!) 58   Temp 99.3 F (37.4 C) (Oral)   Resp 16   SpO2 95%  Physical Exam  Constitutional: She is oriented to person, place, and time. She appears well-developed and well-nourished. No distress.  HENT:  Head: Normocephalic and atraumatic.  Right Ear: External ear normal.  Left Ear: External ear normal.  Nose: Nose normal.  Mouth/Throat: Oropharynx is clear and moist.  Eyes: Conjunctivae and EOM are normal. Pupils are equal, round, and reactive to light.  Neck: Normal range of motion. Neck supple. Carotid bruit is not present. No thyromegaly present.  Cardiovascular: Normal rate, regular rhythm, normal heart sounds and intact distal pulses.  Exam reveals no gallop and no friction rub.   No murmur heard. Pulmonary/Chest: Effort normal and breath sounds normal. She has no wheezes. She has no rales.  Abdominal: Soft. Bowel sounds are normal. She exhibits no distension and no mass. There is no tenderness. There is no rebound and no guarding.  Lymphadenopathy:    She has no cervical adenopathy.  Neurological: She is alert and oriented to person, place, and time. No cranial nerve deficit.  Sitting in wheelchair.   Skin: Skin is warm and dry. No rash noted. She is not diaphoretic. No erythema. No pallor.  Psychiatric: She has a normal mood and affect. Her behavior is normal.   Depression screen [facility] 2/9 05/23/2016 12/15/2015 07/20/2015 03/08/2015 11/04/2014  Decreased Interest 0 0 0 0 0  Down, Depressed, Hopeless 0 0 0 1 1  PHQ - 2 Score 0 0 0 1 1  Altered sleeping - - - - -  Tired, decreased energy - - - - -  Change in appetite - - - - -  Feeling bad or failure about yourself  - - - - -  Trouble concentrating - - - - -  Moving slowly or fidgety/restless - - - - -  Suicidal  thoughts - - - - -  PHQ-9 Score - - - - -     Fall Risk  05/23/2016 12/15/2015 07/20/2015 03/08/2015 11/04/2014  Falls in the past year? No No Yes No No  Number falls in past yr: - - - - -       Assessment & Plan:   1. Essential hypertension   2. Gastroesophageal reflux disease without esophagitis   3. Hypothyroidism due to acquired atrophy of thyroid   4. Partial seizure (HCC)   5. Hereditary and idiopathic peripheral neuropathy   6. Bilateral occipital neuralgia   7. History of CVA with residual deficit   8. Glucose intolerance (impaired glucose tolerance)   9. Pure hypercholesterolemia   10. Need for prophylactic vaccination and inoculation against influenza   11. Bilateral impacted cerumen   12. Thrush   13. Viral URI    -controlled hypertension, hypercholesterolemia, and glucose intolerance; obtain labs;  refills provided. -stable neuropathy; continue Neurontin and hydrocodone as prescribed; refill provided. -s/p irrigation B ears by CMA Alona Bene -new onset thrush; rx for Nystatin swish and swallow; soak dentures qhs. -good family support despite weakness one sided from previous CVA.   Orders Placed This Encounter  Procedures  . Flu Vaccine QUAD 36+ mos IM  . CBC with Differential/Platelet  . Comprehensive metabolic panel    Order Specific Question:   Has the patient fasted?    Answer:   Yes  . Hemoglobin A1c  . Lipid panel    Order Specific Question:   Has the patient fasted?    Answer:   Yes  . T4, free  . TSH  . Ear wax removal    BILATERAL   Meds ordered this encounter  Medications  . HYDROcodone-acetaminophen (NORCO/VICODIN) 5-325 MG tablet    Sig: Take 1/2 - 1 tablet twice a day as needed for pain.    Dispense:  60 tablet    Refill:  0  . levothyroxine (SYNTHROID, LEVOTHROID) 25 MCG tablet    Sig: TAKE 1 TABLET BY MOUTH EVERY MORNING BEFORE breakfast    Dispense:  90 tablet    Refill:  3  . levETIRAcetam (KEPPRA) 500 MG tablet    Sig: Take 1 tablet (500  mg total) by mouth every 12 (twelve) hours.    Dispense:  180 tablet    Refill:  1  . gabapentin (NEURONTIN) 100 MG capsule    Sig: Take 1 capsule TWICE daily    Dispense:  180 capsule    Refill:  1  . clopidogrel (PLAVIX) 75 MG tablet    Sig: Take 1 tablet (75 mg total) by mouth daily.    Dispense:  90 tablet    Refill:  3    This prescription was filled today. Any refills authorized will be placed on file.  . carvedilol (COREG) 6.25 MG tablet    Sig: Take 1 tablet (6.25 mg total) by mouth 2 (two) times daily with a meal.    Dispense:  180 tablet    Refill:  1  . atorvastatin (LIPITOR) 80 MG tablet    Sig: Take 1 tablet (80 mg total) by mouth daily.    Dispense:  90 tablet    Refill:  3  . amLODipine (NORVASC) 10 MG tablet    Sig: Take 1 tablet (10 mg total) by mouth daily.    Dispense:  90 tablet    Refill:  3  . nystatin (MYCOSTATIN) 100000 UNIT/ML suspension    Sig: Take 5 mLs (500,000 Units total) by mouth 4 (four) times daily.    Dispense:  200 mL    Refill:  0    Return in about 4 months (around 09/20/2016) for complete physical examiniation.   Ying Blankenhorn Paulita Fujita, M.D. [facility] [address] [place], [place]  [address] [phone] phone [phone] fax

## 2016-05-23 NOTE — Patient Instructions (Signed)
     IF you received an x-ray today, you will receive an invoice from [facility]. Please contact [facility] at [phone] with questions or concerns regarding your invoice.   IF you received labwork today, you will receive an invoice from [facility]. Please contact [facility] at [phone] with questions or concerns regarding your invoice.   Our billing staff will not be able to assist you with questions regarding bills from these companies.  You will be contacted with the lab results as soon as they are available. The fastest way to get your results is to activate your My Chart account. Instructions are located on the last page of this paperwork. If you have not heard from us regarding the results in 2 weeks, please contact this office.     

## 2016-05-24 LAB — COMPREHENSIVE METABOLIC PANEL
A/G RATIO: 1.1 — AB (ref 1.2–2.2)
ALBUMIN: 4.3 g/dL (ref 3.5–4.8)
ALK PHOS: 143 IU/L — AB (ref 39–117)
ALT: 10 IU/L (ref 0–32)
AST: 27 IU/L (ref 0–40)
BILIRUBIN TOTAL: 0.3 mg/dL (ref 0.0–1.2)
BUN / CREAT RATIO: 8 — AB (ref 12–28)
BUN: 9 mg/dL (ref 8–27)
CHLORIDE: 101 mmol/L (ref 96–106)
CO2: 24 mmol/L (ref 18–29)
Calcium: 9.1 mg/dL (ref 8.7–10.3)
Creatinine, Ser: 1.08 mg/dL — ABNORMAL HIGH (ref 0.57–1.00)
GFR calc non Af Amer: 49 mL/min/{1.73_m2} — ABNORMAL LOW (ref >59)
GFR, EST AFRICAN AMERICAN: 56 mL/min/{1.73_m2} — AB (ref >59)
Globulin, Total: 3.9 g/dL (ref 1.5–4.5)
Glucose: 100 mg/dL — ABNORMAL HIGH (ref 65–99)
POTASSIUM: 3.9 mmol/L (ref 3.5–5.2)
Sodium: 143 mmol/L (ref 134–144)
TOTAL PROTEIN: 8.2 g/dL (ref 6.0–8.5)

## 2016-05-24 LAB — LIPID PANEL
CHOL/HDL RATIO: 2.2 ratio (ref 0.0–4.4)
Cholesterol, Total: 135 mg/dL (ref 100–199)
HDL: 61 mg/dL (ref >39)
LDL CALC: 59 mg/dL (ref 0–99)
Triglycerides: 75 mg/dL (ref 0–149)
VLDL Cholesterol Cal: 15 mg/dL (ref 5–40)

## 2016-05-24 LAB — CBC WITH DIFFERENTIAL/PLATELET
Basophils Absolute: 0 10*3/uL (ref 0.0–0.2)
Basos: 0 %
EOS (ABSOLUTE): 0 10*3/uL (ref 0.0–0.4)
Eos: 1 %
Hematocrit: 40.7 % (ref 34.0–46.6)
Hemoglobin: 14.3 g/dL (ref 11.1–15.9)
IMMATURE GRANS (ABS): 0 10*3/uL (ref 0.0–0.1)
Immature Granulocytes: 0 %
LYMPHS ABS: 2.7 10*3/uL (ref 0.7–3.1)
LYMPHS: 49 %
MCH: 28.9 pg (ref 26.6–33.0)
MCHC: 35.1 g/dL (ref 31.5–35.7)
MCV: 82 fL (ref 79–97)
Monocytes Absolute: 0.4 10*3/uL (ref 0.1–0.9)
Monocytes: 7 %
NEUTROS ABS: 2.4 10*3/uL (ref 1.4–7.0)
Neutrophils: 43 %
PLATELETS: 153 10*3/uL (ref 150–379)
RBC: 4.94 x10E6/uL (ref 3.77–5.28)
RDW: 16.1 % — ABNORMAL HIGH (ref 12.3–15.4)
WBC: 5.6 10*3/uL (ref 3.4–10.8)

## 2016-05-24 LAB — TSH: TSH: 2.08 u[IU]/mL (ref 0.450–4.500)

## 2016-05-24 LAB — T4, FREE: FREE T4: 1.2 ng/dL (ref 0.82–1.77)

## 2016-05-24 LAB — HEMOGLOBIN A1C
Est. average glucose Bld gHb Est-mCnc: 120 mg/dL
Hgb A1c MFr Bld: 5.8 % — ABNORMAL HIGH (ref 4.8–5.6)

## 2016-06-20 ENCOUNTER — Telehealth: Payer: Self-pay

## 2016-06-20 MED ORDER — HYDROCODONE-ACETAMINOPHEN 5-325 MG PO TABS: ORAL_TABLET | ORAL | 0 refills | Status: DC

## 2016-06-20 NOTE — Telephone Encounter (Signed)
Rx printed at 104. Will bring to 102 after clinic.  Meds ordered this encounter  Medications  . HYDROcodone-acetaminophen (NORCO/VICODIN) 5-325 MG tablet    Sig: Take 1/2 - 1 tablet twice a day as needed for pain.    Dispense:  60 tablet    Refill:  0    Order Specific Question:   Supervising Provider    Answer:   Clelia CroftSHAW, EVA N [[id number]]

## 2016-06-20 NOTE — Telephone Encounter (Signed)
Patient needs her HYDROcodone-acetaminophen (NORCO/VICODIN) 5-325 MG tablet refilled.  Her call back number is [phone]

## 2016-06-20 NOTE — Telephone Encounter (Signed)
Can you refill this patient's hydrocodone in my absence?  She takes it for peripheral neuropathy. She cannot tolerate high doses of Gabapentin due to side effects.

## 2016-07-21 ENCOUNTER — Other Ambulatory Visit: Payer: Self-pay | Admitting: Family Medicine

## 2016-07-21 NOTE — Telephone Encounter (Signed)
Pt need a refill on her hydrocodone

## 2016-07-21 NOTE — Telephone Encounter (Signed)
06/20/16 last refill 05/2016 last ov

## 2016-07-24 MED ORDER — HYDROCODONE-ACETAMINOPHEN 5-325 MG PO TABS: ORAL_TABLET | ORAL | 0 refills | Status: DC

## 2016-08-28 ENCOUNTER — Other Ambulatory Visit: Payer: Self-pay | Admitting: Family Medicine

## 2016-08-28 NOTE — Telephone Encounter (Signed)
PATIENT WOULD LIKE DR.Katrinka BlazingMITH TO KNOW THAT SHE NEEDS A REFILL ON HER HYDROCODONE (NORCO/VICODIN) 5-325 MG. PLEASE CALL HER WHEN THE PRESCRIPTION CAN BE PICKED UP. BEST PHONE [phone] (CELL)  MBC

## 2016-09-01 NOTE — Telephone Encounter (Signed)
PT CALLING BACK LOOKING FOR HYDROCODONE REFILL SHE IS VERY UPSET THAT DR Carla Manning HASN'T RESPONDED SINCE LAST PHONE MESSAGE ON 08-28-16 PATIENT STATES THAT SHE WILL NOT BE IN TOWN TOMORROW AND HAS TO GO THROUGH THIS EVERY TIME SHE NEED A REFILL

## 2016-09-01 NOTE — Telephone Encounter (Signed)
05/23/16 last ov 07/24/16 last refill

## 2016-09-02 MED ORDER — HYDROCODONE-ACETAMINOPHEN 5-325 MG PO TABS: ORAL_TABLET | ORAL | 0 refills | Status: DC

## 2016-09-02 NOTE — Telephone Encounter (Signed)
pts daughter advised and rx up front

## 2016-09-02 NOTE — Telephone Encounter (Signed)
Call -- prescription is ready for pick up.

## 2016-09-19 ENCOUNTER — Ambulatory Visit: Payer: Medicare Other | Admitting: Family Medicine

## 2016-09-28 ENCOUNTER — Other Ambulatory Visit: Payer: Self-pay | Admitting: Physician Assistant

## 2016-10-06 ENCOUNTER — Ambulatory Visit (INDEPENDENT_AMBULATORY_CARE_PROVIDER_SITE_OTHER): Payer: Medicare Other | Admitting: Family Medicine

## 2016-10-06 ENCOUNTER — Encounter: Payer: Self-pay | Admitting: Family Medicine

## 2016-10-06 VITALS — BP 133/75 | HR 55 | Temp 99.0°F | Resp 16 | Ht 60.0 in

## 2016-10-06 DIAGNOSIS — G609 Hereditary and idiopathic neuropathy, unspecified: Secondary | ICD-10-CM

## 2016-10-06 DIAGNOSIS — M5481 Occipital neuralgia: Secondary | ICD-10-CM | POA: Diagnosis not present

## 2016-10-06 DIAGNOSIS — I10 Essential (primary) hypertension: Secondary | ICD-10-CM | POA: Diagnosis not present

## 2016-10-06 DIAGNOSIS — Z8673 Personal history of transient ischemic attack (TIA), and cerebral infarction without residual deficits: Secondary | ICD-10-CM

## 2016-10-06 DIAGNOSIS — E7800 Pure hypercholesterolemia, unspecified: Secondary | ICD-10-CM | POA: Diagnosis not present

## 2016-10-06 DIAGNOSIS — K219 Gastro-esophageal reflux disease without esophagitis: Secondary | ICD-10-CM

## 2016-10-06 DIAGNOSIS — I672 Cerebral atherosclerosis: Secondary | ICD-10-CM | POA: Diagnosis not present

## 2016-10-06 DIAGNOSIS — E034 Atrophy of thyroid (acquired): Secondary | ICD-10-CM

## 2016-10-06 DIAGNOSIS — E7439 Other disorders of intestinal carbohydrate absorption: Secondary | ICD-10-CM | POA: Diagnosis not present

## 2016-10-06 DIAGNOSIS — R569 Unspecified convulsions: Secondary | ICD-10-CM

## 2016-10-06 MED ORDER — HYDROCODONE-ACETAMINOPHEN 5-325 MG PO TABS: ORAL_TABLET | ORAL | 0 refills | Status: DC

## 2016-10-06 MED ORDER — NYSTATIN 100000 UNIT/GM EX CREA: 1.0000 "application " | TOPICAL_CREAM | Freq: Two times a day (BID) | CUTANEOUS | 2 refills | Status: DC

## 2016-10-06 NOTE — Patient Instructions (Signed)
     IF you received an x-ray today, you will receive an invoice from [facility]. Please contact  Radiology at [phone] with questions or concerns regarding your invoice.   IF you received labwork today, you will receive an invoice from [facility]. Please contact [facility] at [phone] with questions or concerns regarding your invoice.   Our billing staff will not be able to assist you with questions regarding bills from these companies.  You will be contacted with the lab results as soon as they are available. The fastest way to get your results is to activate your My Chart account. Instructions are located on the last page of this paperwork. If you have not heard from us regarding the results in 2 weeks, please contact this office.     

## 2016-10-06 NOTE — Progress Notes (Signed)
Subjective:    Patient ID: Carla Manning, female    DOB: 1935-01-23, 82 y.o.   MRN: [id number]  10/06/2016  Follow-up   HPI This 82 y.o. female presents with daughter for four month follow-up of chronic pain syndrome, hypertension, hypercholesterolemia, history of CVA.  Pain is under good pain control at this time.  Not checking blood pressure at home. Patient reports good compliance with medication, good tolerance to medication, and good symptom control.  No concerns today; feeling well.    Appetite has decreaesd at dinner.  Small serving of vegetables.  One wing.  Rash: present under presents and in groin region.  Itches a lot.  Applying topical barrier cream with some relief.   Immunization History  Administered Date(s) Administered  . Influenza,inj,Quad PF,36+ Mos 04/08/2013, 03/24/2014, 03/08/2015, 05/23/2016  . Pneumococcal Conjugate-13 11/04/2014  . Pneumococcal Polysaccharide-23 11/24/2010, 08/29/2012   BP Readings from Last 3 Encounters:  10/06/16 133/75  05/23/16 133/88  12/15/15 140/82   Wt Readings from Last 3 Encounters:  01/27/13 168 lb (76.2 kg)  08/28/12 184 lb 1.6 oz (83.5 kg)    Review of Systems  Constitutional: Negative for chills, diaphoresis, fatigue and fever.  Eyes: Negative for visual disturbance.  Respiratory: Negative for cough and shortness of breath.   Cardiovascular: Negative for chest pain, palpitations and leg swelling.  Gastrointestinal: Negative for abdominal pain, constipation, diarrhea, nausea and vomiting.  Endocrine: Negative for cold intolerance, heat intolerance, polydipsia, polyphagia and polyuria.  Skin: Positive for rash.  Neurological: Positive for weakness and numbness. Negative for dizziness, tremors, seizures, syncope, facial asymmetry, speech difficulty, light-headedness and headaches.    Past Medical History:  Diagnosis Date  . Anxiety   . Depression   . GERD (gastroesophageal reflux disease)   . Hyperlipidemia   .  Hypertension   . Hypothyroid   . Seizures (HCC)    post CVA  . Stroke [facility])    R sided weakness, aphasia   Past Surgical History:  Procedure Laterality Date  . NO PAST SURGERIES     No Known Allergies  Social History   Social History  . Marital status: Divorced    Spouse name: N/A  . Number of children: 12  . Years of education: 6   Occupational History  . Retired Not Employed   Social History Main Topics  . Smoking status: Never Smoker  . Smokeless tobacco: Never Used  . Alcohol use No  . Drug use: No  . Sexual activity: No   Other Topics Concern  . Not on file   Social History Narrative   Marital status: single/divorced.      Children: 12 children; 52 grandchildren; 13 gg.      Lives: with son;       Employment: retired/disability.  Forensic scientist.         Tobacco: none      Alcohol: none      ADLs: wheelchair bound; no incontinence; walks to bathroom; bathing self; needs some assistance with dressing; can brush teeth herself;no cooking; no driving.  No finances; no grocery shopping.  Caregiver on weekends Sunday and Monday at nights and all day.      Advanced Directives:  HCPOA [name] [phone]);  FULL CODE.     Patient is now left handed.   Patient has 6 th grade education.   Patient drinks 1 1/2 cup daily.   Family History  Problem Relation Age of Onset  . Diabetes Mother   .  Hypertension Daughter   . Hypertension Daughter   . Diabetes Daughter        Objective:    BP 133/75 (BP Location: Left Arm, Patient Position: Sitting, Cuff Size: Large)   Pulse (!) 55   Temp 99 F (37.2 C) (Oral)   Resp 16   Ht 5' (1.524 m)   LMP 10/06/2016   SpO2 100%  Physical Exam  Constitutional: She is oriented to person, place, and time. She appears well-developed and well-nourished. No distress.  HENT:  Head: Normocephalic and atraumatic.  Right Ear: External ear normal.  Left Ear: External ear normal.  Nose: Nose normal.  Mouth/Throat:  Oropharynx is clear and moist.  Eyes: Conjunctivae and EOM are normal. Pupils are equal, round, and reactive to light.  Neck: Normal range of motion. Neck supple. Carotid bruit is not present. No thyromegaly present.  Cardiovascular: Normal rate, regular rhythm, normal heart sounds and intact distal pulses.  Exam reveals no gallop and no friction rub.   No murmur heard. Pulmonary/Chest: Effort normal and breath sounds normal. She has no wheezes. She has no rales.  Abdominal: Soft. Bowel sounds are normal. She exhibits no distension and no mass. There is no tenderness. There is no rebound and no guarding.  Lymphadenopathy:    She has no cervical adenopathy.  Neurological: She is alert and oriented to person, place, and time. No cranial nerve deficit. She exhibits abnormal muscle tone.  Skin: Skin is warm and dry. Rash noted. She is not diaphoretic. No erythema. No pallor.  Mildly erythematous rash under B breasts and in B inguinal folds.  Psychiatric: She has a normal mood and affect. Her behavior is normal.   Depression screen [facility] 2/9 10/06/2016 05/23/2016 12/15/2015 07/20/2015 03/08/2015  Decreased Interest 0 0 0 0 0  Down, Depressed, Hopeless 0 0 0 0 1  PHQ - 2 Score 0 0 0 0 1  Altered sleeping - - - - -  Tired, decreased energy - - - - -  Change in appetite - - - - -  Feeling bad or failure about yourself  - - - - -  Trouble concentrating - - - - -  Moving slowly or fidgety/restless - - - - -  Suicidal thoughts - - - - -  PHQ-9 Score - - - - -        Assessment & Plan:   1. Essential hypertension   2. Cerebrovascular disease, arteriosclerotic, post-stroke   3. Gastroesophageal reflux disease without esophagitis   4. Hypothyroidism due to acquired atrophy of thyroid   5. Partial seizure (HCC)   6. Hereditary and idiopathic peripheral neuropathy   7. Pure hypercholesterolemia   8. Bilateral occipital neuralgia   9. Glucose intolerance    -controlled disease states; no change to  management; obtain labs. -suffers with chronic occipital neuralgia and peripheral neuropathy; rx x 3 of hydrocodone provided. -new onset candidiasis due to chronic moisture due to skin folds; rx for nystatin cream.  Orders Placed This Encounter  Procedures  . CBC with Differential/Platelet  . Comprehensive metabolic panel   Meds ordered this encounter  Medications  . nystatin cream (MYCOSTATIN)    Sig: Apply 1 application topically 2 (two) times daily.    Dispense:  30 g    Refill:  2  . DISCONTD: HYDROcodone-acetaminophen (NORCO/VICODIN) 5-325 MG tablet    Sig: Take 1/2 - 1 tablet twice a day as needed for pain.    Dispense:  60 tablet  Refill:  0  . DISCONTD: HYDROcodone-acetaminophen (NORCO/VICODIN) 5-325 MG tablet    Sig: Take 1/2 - 1 tablet twice a day as needed for pain.    Dispense:  60 tablet    Refill:  0    PLEASE DO NOT FILL FOR 30 DAYS AFTER WRITTEN  . HYDROcodone-acetaminophen (NORCO/VICODIN) 5-325 MG tablet    Sig: Take 1/2 - 1 tablet twice a day as needed for pain.    Dispense:  60 tablet    Refill:  0    PLEASE DO NOT FILL FOR 60 DAYS AFTER WRITTEN    Return in about 4 months (around 02/05/2017) for recheck CHRONIC PAIN, HIGH BLOOD PRESSURE.   Karinne Schmader Paulita FujitaMartin Keelie Zemanek, M.D. [facility] [address] [place], [place]  [id number] [phone] phone [phone] fax

## 2016-10-07 LAB — CBC WITH DIFFERENTIAL/PLATELET
BASOS ABS: 0 10*3/uL (ref 0.0–0.2)
BASOS: 0 %
EOS (ABSOLUTE): 0.1 10*3/uL (ref 0.0–0.4)
Eos: 1 %
Hematocrit: 38.2 % (ref 34.0–46.6)
Hemoglobin: 12.5 g/dL (ref 11.1–15.9)
IMMATURE GRANS (ABS): 0 10*3/uL (ref 0.0–0.1)
IMMATURE GRANULOCYTES: 0 %
LYMPHS: 38 %
Lymphocytes Absolute: 2.1 10*3/uL (ref 0.7–3.1)
MCH: 27.3 pg (ref 26.6–33.0)
MCHC: 32.7 g/dL (ref 31.5–35.7)
MCV: 83 fL (ref 79–97)
MONOS ABS: 0.3 10*3/uL (ref 0.1–0.9)
Monocytes: 5 %
NEUTROS ABS: 3.1 10*3/uL (ref 1.4–7.0)
NEUTROS PCT: 56 %
PLATELETS: 133 10*3/uL — AB (ref 150–379)
RBC: 4.58 x10E6/uL (ref 3.77–5.28)
RDW: 16.3 % — AB (ref 12.3–15.4)
WBC: 5.5 10*3/uL (ref 3.4–10.8)

## 2016-10-07 LAB — COMPREHENSIVE METABOLIC PANEL
A/G RATIO: 1.1 — AB (ref 1.2–2.2)
ALT: 15 IU/L (ref 0–32)
AST: 29 IU/L (ref 0–40)
Albumin: 4.1 g/dL (ref 3.5–4.7)
Alkaline Phosphatase: 141 IU/L — ABNORMAL HIGH (ref 39–117)
BILIRUBIN TOTAL: 0.3 mg/dL (ref 0.0–1.2)
BUN/Creatinine Ratio: 14 (ref 12–28)
BUN: 16 mg/dL (ref 8–27)
CHLORIDE: 104 mmol/L (ref 96–106)
CO2: 25 mmol/L (ref 18–29)
Calcium: 9 mg/dL (ref 8.7–10.3)
Creatinine, Ser: 1.12 mg/dL — ABNORMAL HIGH (ref 0.57–1.00)
GFR calc Af Amer: 54 mL/min/{1.73_m2} — ABNORMAL LOW (ref >59)
GFR calc non Af Amer: 47 mL/min/{1.73_m2} — ABNORMAL LOW (ref >59)
GLUCOSE: 146 mg/dL — AB (ref 65–99)
Globulin, Total: 3.6 g/dL (ref 1.5–4.5)
POTASSIUM: 4.1 mmol/L (ref 3.5–5.2)
Sodium: 144 mmol/L (ref 134–144)
Total Protein: 7.7 g/dL (ref 6.0–8.5)

## 2016-10-23 ENCOUNTER — Encounter: Payer: Self-pay | Admitting: Family Medicine

## 2016-10-23 ENCOUNTER — Telehealth: Payer: Self-pay | Admitting: *Deleted

## 2016-10-23 NOTE — Telephone Encounter (Signed)
Letter sent.

## 2016-10-25 ENCOUNTER — Other Ambulatory Visit: Payer: Self-pay | Admitting: Physician Assistant

## 2017-01-26 ENCOUNTER — Telehealth: Payer: Self-pay | Admitting: Family Medicine

## 2017-01-26 NOTE — Telephone Encounter (Signed)
PATIENT'S DAUGHTER ([name])  IS CALLING TO LET DR. Katrinka Blazing KNOW THAT HER MOTHER NEEDS A REFILL ON HER HYDROCODONE-ACETAMINOPHEN 5-325 MG. PLEASE CALL HER WHEN SHE CAN PICK THE PRESCRIPTION UP. BEST PHONE [phone] ([name] SUMMERS) SHE WILL GET HER MOTHER TO FILL OUT A 2018 HIPAA BECAUSE ONE HAS NOT BEEN DONE SINCE 2016 BUT SHE IS ON IT. MBC

## 2017-01-29 ENCOUNTER — Other Ambulatory Visit: Payer: Self-pay | Admitting: Physician Assistant

## 2017-01-30 NOTE — Telephone Encounter (Signed)
Pt daughter calling back stating that pt needs her medication stating that she took her last one on 10/2.Marland Kitchen  Please advise

## 2017-02-02 MED ORDER — HYDROCODONE-ACETAMINOPHEN 5-325 MG PO TABS: ORAL_TABLET | ORAL | 0 refills | Status: DC

## 2017-02-02 NOTE — Telephone Encounter (Signed)
PATIENT DAUGHTER CALLING AGAIN STATING THAT SHE HAS LEFT TWO MESSAGES FOR MOTHERS REFILL OF HYDROCODONE SHE HAS BEEN OUT SINCE THE 2ND OF OCT

## 2017-02-02 NOTE — Telephone Encounter (Signed)
Please advise 

## 2017-02-02 NOTE — Telephone Encounter (Signed)
Chelle, Can you please refill this patient's hydrocodone since I am out of the office today?

## 2017-02-02 NOTE — Telephone Encounter (Signed)
Meds ordered this encounter  Medications  . HYDROcodone-acetaminophen (NORCO/VICODIN) 5-325 MG tablet    Sig: Take 1/2 - 1 tablet twice a day as needed for pain.    Dispense:  60 tablet    Refill:  0    Order Specific Question:   Supervising Provider    Answer:   Clelia Croft, EVA N [[id number]]

## 2017-02-06 ENCOUNTER — Ambulatory Visit: Payer: Medicare Other | Admitting: Family Medicine

## 2017-02-26 ENCOUNTER — Other Ambulatory Visit: Payer: Self-pay | Admitting: Physician Assistant

## 2017-03-02 ENCOUNTER — Telehealth: Payer: Self-pay

## 2017-03-02 NOTE — Telephone Encounter (Signed)
Called pt to schedule Medicare Annual Wellness Visit. -nr  

## 2017-03-09 ENCOUNTER — Telehealth: Payer: Self-pay | Admitting: Family Medicine

## 2017-03-09 NOTE — Telephone Encounter (Signed)
Copied from CRM [phone]. Topic: Quick Communication - See Telephone Encounter >> Mar 09, 2017  1:57 PM Arlyss Gandyichardson, Rumi Taras N, NT wrote: CRM for notification. See Telephone encounter for: Patient is calling to get a refill of hydrocodone.   03/09/17.

## 2017-03-09 NOTE — Telephone Encounter (Signed)
Controlled medication. Thanks. 

## 2017-03-13 MED ORDER — HYDROCODONE-ACETAMINOPHEN 5-325 MG PO TABS: ORAL_TABLET | ORAL | 0 refills | Status: DC

## 2017-03-15 NOTE — Telephone Encounter (Signed)
Please call patient and advise hydrocodone rx is ready for pick up at [facility].  Pleaase also confirm that rx placed at [facility] rx pick up folder.

## 2017-03-15 NOTE — Telephone Encounter (Signed)
Rx has already been picked  Up by pt daughter on 03/14/2017.

## 2017-03-19 ENCOUNTER — Ambulatory Visit: Payer: Medicare Other

## 2017-03-26 ENCOUNTER — Ambulatory Visit: Payer: Medicare Other | Admitting: Family Medicine

## 2017-03-28 ENCOUNTER — Other Ambulatory Visit: Payer: Self-pay | Admitting: Physician Assistant

## 2017-03-30 ENCOUNTER — Other Ambulatory Visit: Payer: Self-pay

## 2017-03-30 ENCOUNTER — Ambulatory Visit (INDEPENDENT_AMBULATORY_CARE_PROVIDER_SITE_OTHER): Payer: Medicare Other

## 2017-03-30 ENCOUNTER — Encounter: Payer: Self-pay | Admitting: Family Medicine

## 2017-03-30 ENCOUNTER — Ambulatory Visit (INDEPENDENT_AMBULATORY_CARE_PROVIDER_SITE_OTHER): Payer: Medicare Other | Admitting: Family Medicine

## 2017-03-30 VITALS — BP 134/74 | HR 60 | Temp 98.7°F | Ht 60.0 in

## 2017-03-30 VITALS — BP 140/82 | HR 78 | Temp 98.0°F | Resp 16

## 2017-03-30 DIAGNOSIS — K219 Gastro-esophageal reflux disease without esophagitis: Secondary | ICD-10-CM

## 2017-03-30 DIAGNOSIS — G63 Polyneuropathy in diseases classified elsewhere: Secondary | ICD-10-CM | POA: Diagnosis not present

## 2017-03-30 DIAGNOSIS — E7800 Pure hypercholesterolemia, unspecified: Secondary | ICD-10-CM | POA: Diagnosis not present

## 2017-03-30 DIAGNOSIS — Z0000 Encounter for general adult medical examination without abnormal findings: Secondary | ICD-10-CM

## 2017-03-30 DIAGNOSIS — R569 Unspecified convulsions: Secondary | ICD-10-CM

## 2017-03-30 DIAGNOSIS — E034 Atrophy of thyroid (acquired): Secondary | ICD-10-CM

## 2017-03-30 DIAGNOSIS — Z23 Encounter for immunization: Secondary | ICD-10-CM

## 2017-03-30 DIAGNOSIS — I10 Essential (primary) hypertension: Secondary | ICD-10-CM | POA: Diagnosis not present

## 2017-03-30 DIAGNOSIS — M5481 Occipital neuralgia: Secondary | ICD-10-CM | POA: Diagnosis not present

## 2017-03-30 DIAGNOSIS — I6930 Unspecified sequelae of cerebral infarction: Secondary | ICD-10-CM

## 2017-03-30 DIAGNOSIS — E7439 Other disorders of intestinal carbohydrate absorption: Secondary | ICD-10-CM

## 2017-03-30 DIAGNOSIS — Z8673 Personal history of transient ischemic attack (TIA), and cerebral infarction without residual deficits: Secondary | ICD-10-CM

## 2017-03-30 DIAGNOSIS — I672 Cerebral atherosclerosis: Secondary | ICD-10-CM

## 2017-03-30 MED ORDER — CARVEDILOL 6.25 MG PO TABS: ORAL_TABLET | ORAL | 1 refills | Status: DC

## 2017-03-30 MED ORDER — CLOPIDOGREL BISULFATE 75 MG PO TABS: 75.0000 mg | ORAL_TABLET | Freq: Every day | ORAL | 3 refills | Status: DC

## 2017-03-30 MED ORDER — ATORVASTATIN CALCIUM 80 MG PO TABS: 80.0000 mg | ORAL_TABLET | Freq: Every day | ORAL | 3 refills | Status: DC

## 2017-03-30 MED ORDER — AMLODIPINE BESYLATE 10 MG PO TABS: 10.0000 mg | ORAL_TABLET | Freq: Every day | ORAL | 3 refills | Status: DC

## 2017-03-30 MED ORDER — LEVOTHYROXINE SODIUM 25 MCG PO TABS: ORAL_TABLET | ORAL | 3 refills | Status: DC

## 2017-03-30 NOTE — Progress Notes (Signed)
Subjective:    Patient ID: Carla Manning, female    DOB: 10-21-1934, 82 y.o.   MRN: [id number]  03/30/2017  Annual Exam    HPI This 82 y.o. female presents for follow-up of hypertension, hypercholesterolemia, history of CVA, peripheral neuropathy and occipital neuralgia. No changes to management made at last visit. Labs are stable.   Last physical:  AWV 03-30-2017 Pap smear: n/a Mammogram:   n/a Colonoscopy: n/a Bone density:  n/a  HTN: Patient reports good compliance with medication, good tolerance to medication, and good symptom control.   Hypercholesterolemia: Patient reports good compliance with medication, good tolerance to medication, and good symptom control.    History of CVA: Patient reports good compliance with medication, good tolerance to medication, and good symptom control.    Peripheral neuropathy:Patient reports good compliance with medication, good tolerance to medication, and good symptom control.    Glucose intolerance: eating well.   No exam data present   BP Readings from Last 3 Encounters:  03/30/17 140/82  03/30/17 134/74  10/06/16 133/75   Wt Readings from Last 3 Encounters:  01/27/13 168 lb (76.2 kg)  08/28/12 184 lb 1.6 oz (83.5 kg)   Immunization History  Administered Date(s) Administered  . Influenza,inj,Quad PF,6+ Mos 04/08/2013, 03/24/2014, 03/08/2015, 05/23/2016, 03/30/2017  . Pneumococcal Conjugate-13 11/04/2014  . Pneumococcal Polysaccharide-23 11/24/2010, 08/29/2012    Review of Systems  Constitutional: Negative for activity change, appetite change, chills, diaphoresis, fatigue, fever and unexpected weight change.  HENT: Negative for congestion, dental problem, drooling, ear discharge, ear pain, facial swelling, hearing loss, mouth sores, nosebleeds, postnasal drip, rhinorrhea, sinus pressure, sneezing, sore throat, tinnitus, trouble swallowing and voice change.   Eyes: Negative for photophobia, pain, discharge, redness, itching  and visual disturbance.  Respiratory: Negative for apnea, cough, choking, chest tightness, shortness of breath, wheezing and stridor.   Cardiovascular: Positive for leg swelling. Negative for chest pain and palpitations.  Gastrointestinal: Negative for abdominal distention, abdominal pain, anal bleeding, blood in stool, constipation, diarrhea, nausea, rectal pain and vomiting.  Endocrine: Negative for cold intolerance, heat intolerance, polydipsia, polyphagia and polyuria.  Genitourinary: Negative for decreased urine volume, difficulty urinating, dyspareunia, dysuria, enuresis, flank pain, frequency, genital sores, hematuria, menstrual problem, pelvic pain, urgency, vaginal bleeding, vaginal discharge and vaginal pain.       Nocturia x 4.  Musculoskeletal: Negative for arthralgias, back pain, gait problem, joint swelling, myalgias, neck pain and neck stiffness.  Skin: Negative for color change, pallor, rash and wound.  Allergic/Immunologic: Negative for environmental allergies, food allergies and immunocompromised state.  Neurological: Positive for weakness. Negative for dizziness, tremors, seizures, syncope, facial asymmetry, speech difficulty, light-headedness, numbness and headaches.  Hematological: Negative for adenopathy. Does not bruise/bleed easily.  Psychiatric/Behavioral: Negative for agitation, behavioral problems, confusion, decreased concentration, dysphoric mood, hallucinations, self-injury, sleep disturbance and suicidal ideas. The patient is not nervous/anxious and is not hyperactive.     Past Medical History:  Diagnosis Date  . Anxiety   . Depression   . GERD (gastroesophageal reflux disease)   . Hyperlipidemia   . Hypertension   . Hypothyroid   . Seizures (HCC)    post CVA  . Stroke [facility](HCC)    R sided weakness, aphasia   Past Surgical History:  Procedure Laterality Date  . NO PAST SURGERIES     No Known Allergies Current Outpatient Medications on File Prior to Visit    Medication Sig Dispense Refill  . gabapentin (NEURONTIN) 100 MG capsule TAKE 1 CAPSULE BY MOUTH 2  TIMES DAILY 180 capsule 1  . HYDROcodone-acetaminophen (NORCO/VICODIN) 5-325 MG tablet Take 1/2 - 1 tablet twice a day as needed for pain. 60 tablet 0  . levETIRAcetam (KEPPRA) 500 MG tablet TAKE 1 TABLET BY MOUTH EVERY 12 HOURS 180 tablet 1  . ranitidine (ZANTAC) 150 MG capsule Take 150 mg by mouth daily.     No current facility-administered medications on file prior to visit.    Social History   Socioeconomic History  . Marital status: Divorced    Spouse name: Not on file  . Number of children: 12  . Years of education: 6  . Highest education level: 6th grade  Social Needs  . Financial resource strain: Not hard at all  . Food insecurity - worry: Never true  . Food insecurity - inability: Never true  . Transportation needs - medical: No  . Transportation needs - non-medical: No  Occupational History  . Occupation: Retired    Associate Professormployer: NOT EMPLOYED  Tobacco Use  . Smoking status: Never Smoker  . Smokeless tobacco: Never Used  Substance and Sexual Activity  . Alcohol use: No    Alcohol/week: 0.0 oz  . Drug use: No  . Sexual activity: No    Birth control/protection: Post-menopausal  Other Topics Concern  . Not on file  Social History Narrative   Marital status: single/divorced.        Children: 12 children; 52 grandchildren; 13 gg.      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         Tobacco: none      Alcohol: none      ADLs: wheelchair bound; no incontinence; walks to bathroom and down the steps to the car; bathing self; needs some assistance with dressing; can brush teeth herself;no cooking; no driving.  No finances; no grocery shopping.  Caregiver all the time (sons, daughter)      Advanced Directives:  HCPOA [name]([name] Summers [phone]);  FULL CODE.        Patient is now left handed.   Patient has 6 th grade education.    Patient drinks 1 1/2 cup daily.   Family History  Problem Relation Age of Onset  . Diabetes Mother   . Hypertension Daughter   . Hypertension Daughter   . Diabetes Daughter        Objective:    BP 140/82   Pulse 78   Temp 98 F (36.7 C) (Oral)   Resp 16   LMP 10/06/2016   SpO2 95%  Physical Exam  Constitutional: She is oriented to person, place, and time. She appears well-developed and well-nourished. No distress.  HENT:  Head: Normocephalic and atraumatic.  Right Ear: External ear normal.  Left Ear: External ear normal.  Nose: Nose normal.  Mouth/Throat: Oropharynx is clear and moist.  Eyes: Conjunctivae and EOM are normal. Pupils are equal, round, and reactive to light.  Neck: Normal range of motion and full passive range of motion without pain. Neck supple. No JVD present. Carotid bruit is not present. No thyromegaly present.  Cardiovascular: Normal rate, regular rhythm and normal heart sounds. Exam reveals no gallop and no friction rub.  No murmur heard. Pulmonary/Chest: Effort normal and breath sounds normal. She has no wheezes. She has no rales.  Abdominal: Soft. Bowel sounds are normal. She exhibits no distension and no mass. There is no tenderness. There is no rebound and no guarding.  Musculoskeletal:  Right shoulder: Normal.       Left shoulder: Normal.       Cervical back: Normal.  Lymphadenopathy:    She has no cervical adenopathy.  Neurological: She is alert and oriented to person, place, and time. She has normal reflexes. No cranial nerve deficit. She exhibits normal muscle tone. Coordination normal.  Skin: Skin is warm and dry. No rash noted. She is not diaphoretic. No erythema. No pallor.  Psychiatric: She has a normal mood and affect. Her behavior is normal. Judgment and thought content normal.  Nursing note and vitals reviewed.  No results found. Depression screen [facility] 2/9 03/30/2017 03/30/2017 10/06/2016 05/23/2016 12/15/2015  Decreased Interest 0 0 0  0 0  Down, Depressed, Hopeless 0 0 0 0 0  PHQ - 2 Score 0 0 0 0 0  Altered sleeping - - - - -  Tired, decreased energy - - - - -  Change in appetite - - - - -  Feeling bad or failure about yourself  - - - - -  Trouble concentrating - - - - -  Moving slowly or fidgety/restless - - - - -  Suicidal thoughts - - - - -  PHQ-9 Score - - - - -   Fall Risk  03/30/2017 03/30/2017 10/06/2016 05/23/2016 12/15/2015  Falls in the past year? No No No No No  Number falls in past yr: - - - - -        Assessment & Plan:   1. Essential hypertension   2. Gastroesophageal reflux disease without esophagitis   3. Polyneuropathy associated with underlying disease (HCC)   4. Pure hypercholesterolemia   5. Glucose intolerance   6. Cerebrovascular disease, arteriosclerotic, post-stroke   7. Partial seizure (HCC)   8. History of CVA with residual deficit   9. Bilateral occipital neuralgia   10. Hypothyroidism due to acquired atrophy of thyroid    Controlled hypertension, hypercholesterolemia, glucose intolerance, hypothyroidism, partial seizure disorder.  Obtain labs for chronic disease management.  Refills provided.  Peripheral neuropathy well controlled with hydrocodone and gabapentin.    Previous CVA with residual deficits; wheelchair bound with outings; can ambulate within home.  Excellent family support.     Orders Placed This Encounter  Procedures  . CBC with Differential/Platelet  . Comprehensive metabolic panel    Order Specific Question:   Has the patient fasted?    Answer:   No  . Lipid panel    Order Specific Question:   Has the patient fasted?    Answer:   No  . Microalbumin, urine  . POCT urinalysis dipstick   Meds ordered this encounter  Medications  . levothyroxine (SYNTHROID, LEVOTHROID) 25 MCG tablet    Sig: TAKE 1 TABLET BY MOUTH EVERY MORNING BEFORE breakfast    Dispense:  90 tablet    Refill:  3  . clopidogrel (PLAVIX) 75 MG tablet    Sig: Take 1 tablet (75 mg total) by  mouth daily.    Dispense:  90 tablet    Refill:  3  . carvedilol (COREG) 6.25 MG tablet    Sig: TAKE 1 TABLET BY MOUTH 2 TIMES DAILY WITH FOOD    Dispense:  180 tablet    Refill:  1  . atorvastatin (LIPITOR) 80 MG tablet    Sig: Take 1 tablet (80 mg total) by mouth daily.    Dispense:  90 tablet    Refill:  3  . amLODipine (NORVASC) 10 MG tablet  Sig: Take 1 tablet (10 mg total) by mouth daily.    Dispense:  90 tablet    Refill:  3    Return in about 6 months (around 09/27/2017) for follow-up chronic medical conditions.   Ervine Witucki Paulita Fujita, M.D. Primary [facility] at [facility] [address] [place], [place]  [address] [phone] phone [phone] fax

## 2017-03-30 NOTE — Progress Notes (Signed)
Subjective:   Carla Manning is a 82 y.o. female who presents for Medicare Annual (Subsequent) preventive examination.  Review of Systems:  N/A Cardiac Risk Factors include: advanced age (>46men, >46 women);dyslipidemia;hypertension;obesity (BMI >30kg/m2)     Objective:     Vitals: BP 134/74   Pulse 60   Temp 98.7 F (37.1 C) (Oral)   Ht 5' (1.524 m)   LMP 10/06/2016   SpO2 96%   BMI 32.81 kg/m   Body mass index is 32.81 kg/m.  Advanced Directives 03/30/2017 03/30/2015 02/10/2014 08/28/2012  Does Patient Have a Medical Advance Directive? Yes Yes No Patient does not have advance directive;Patient would like information  Type of Advance Directive Healthcare Power of [place];Living will Healthcare Power of [place];Living will - -  Copy of Healthcare Power of Attorney in Chart? No - copy requested No - copy requested - -  Would patient like information on creating a medical advance directive? - - No - patient declined information -    Tobacco Social History   Tobacco Use  Smoking Status Never Smoker  Smokeless Tobacco Never Used     Counseling given: Not Answered   Clinical Intake:  Pre-visit preparation completed: Yes  Pain : No/denies pain     Nutritional Status: BMI > 30  Obese Nutritional Risks: None Diabetes: No  Activities of Daily Living: Independent Ambulation: Independent with device- listed below Home Assistive Devices/Equipment: Wheelchair, Environmental consultant (specify Type), Dentures (specify type), Elevated toliet seat, Shower/tub chair(hemi walker, upper and lower, grab bars, gait belt, reacher ) Medication Administration: Independent Home Management: Independent  Barriers to Care Management & Learning: None  Do you feel unsafe in your current relationship?: No Do you feel physically threatened by others?: No Anyone hurting you at home, work, or school?: No Unable to ask?: No  How often do you need to have someone help you when you read instructions,  pamphlets, or other written materials from your doctor or pharmacy?: 1 - Never What is the last grade level you completed in school?: 6th grade  Interpreter Needed?: No  Information entered by :: Janalyn Shy, LPN  Past Medical History:  Diagnosis Date  . Anxiety   . Depression   . GERD (gastroesophageal reflux disease)   . Hyperlipidemia   . Hypertension   . Hypothyroid   . Seizures (HCC)    post CVA  . Stroke [facility])    R sided weakness, aphasia   Past Surgical History:  Procedure Laterality Date  . NO PAST SURGERIES     Family History  Problem Relation Age of Onset  . Diabetes Mother   . Hypertension Daughter   . Hypertension Daughter   . Diabetes Daughter    Social History   Socioeconomic History  . Marital status: Divorced    Spouse name: None  . Number of children: 12  . Years of education: 6  . Highest education level: 6th grade  Social Needs  . Financial resource strain: Not hard at all  . Food insecurity - worry: Never true  . Food insecurity - inability: Never true  . Transportation needs - medical: No  . Transportation needs - non-medical: No  Occupational History  . Occupation: Retired    Associate Professor: NOT EMPLOYED  Tobacco Use  . Smoking status: Never Smoker  . Smokeless tobacco: Never Used  Substance and Sexual Activity  . Alcohol use: No    Alcohol/week: 0.0 oz  . Drug use: No  . Sexual activity: No  Other Topics  Concern  . None  Social History Narrative   Marital status: single/divorced.      Children: 12 children; 52 grandchildren; 13 gg.      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         Tobacco: none      Alcohol: none      ADLs: wheelchair bound; no incontinence; walks to bathroom; bathing self; needs some assistance with dressing; can brush teeth herself;no cooking; no driving.  No finances; no grocery shopping.  Caregiver on weekends Sunday and Monday at nights and all day.      Advanced  Directives:  HCPOA [name]([name] Summers [phone]);  FULL CODE.     Patient is now left handed.   Patient has 6 th grade education.   Patient drinks 1 1/2 cup daily.    Outpatient Encounter Medications as of 03/30/2017  Medication Sig  . amLODipine (NORVASC) 10 MG tablet Take 1 tablet (10 mg total) by mouth daily.  Marland Kitchen. atorvastatin (LIPITOR) 80 MG tablet Take 1 tablet (80 mg total) by mouth daily.  . carvedilol (COREG) 6.25 MG tablet TAKE 1 TABLET BY MOUTH 2 TIMES DAILY WITH FOOD  . clopidogrel (PLAVIX) 75 MG tablet Take 1 tablet (75 mg total) by mouth daily.  Marland Kitchen. gabapentin (NEURONTIN) 100 MG capsule TAKE 1 CAPSULE BY MOUTH 2 TIMES DAILY  . HYDROcodone-acetaminophen (NORCO/VICODIN) 5-325 MG tablet Take 1/2 - 1 tablet twice a day as needed for pain.  Marland Kitchen. levETIRAcetam (KEPPRA) 500 MG tablet TAKE 1 TABLET BY MOUTH EVERY 12 HOURS  . levothyroxine (SYNTHROID, LEVOTHROID) 25 MCG tablet TAKE 1 TABLET BY MOUTH EVERY MORNING BEFORE breakfast  . ranitidine (ZANTAC) 150 MG capsule Take 150 mg by mouth daily.  . [DISCONTINUED] nystatin cream (MYCOSTATIN) Apply 1 application topically 2 (two) times daily.   No facility-administered encounter medications on file as of 03/30/2017.     Activities of Daily Living In your present state of health, do you have any difficulty performing the following activities: 03/30/2017  Hearing? N  Vision? N  Difficulty concentrating or making decisions? Y  Comment Patient has some memory issues.   Walking or climbing stairs? Y  Comment Patient is in a wheelchair   Dressing or bathing? N  Doing errands, shopping? N  Preparing Food and eating ? N  Using the Toilet? N  In the past six months, have you accidently leaked urine? N  Do you have problems with loss of bowel control? N  Managing your Medications? N  Managing your Finances? N  Housekeeping or managing your Housekeeping? N  Some recent data might be hidden    Timed Get Up and Go performed: Patient is  wheelchair bound and not able to stand.   Patient Care Team: Ethelda ChickSmith, Kristi M, MD as PCP - General (Family Medicine)    Assessment:     Exercise Activities and Dietary recommendations Current Exercise Habits: The patient does not participate in regular exercise at present, Exercise limited by: None identified  Goals    . Exercise 3x per week (30 min per time)     Patient states that she would like to try to become a little more active.       Fall Risk Fall Risk  03/30/2017 10/06/2016 05/23/2016 12/15/2015 07/20/2015  Falls in the past year? No No No No Yes  Number falls in past yr: - - - - -   Is the patient's home free of  loose throw rugs in walkways, pet beds, electrical cords, etc?   yes      Grab bars in the bathroom? yes      Handrails on the stairs?   yes      Adequate lighting?   yes  Depression Screen PHQ 2/9 Scores 03/30/2017 10/06/2016 05/23/2016 12/15/2015  PHQ - 2 Score 0 0 0 0  PHQ- 9 Score - - - -     Cognitive Function     6CIT Screen 03/30/2017 03/30/2017  What Year? 4 points 0 points  What month? 3 points -  What time? 3 points -  Count back from 20 4 points -  Months in reverse 4 points -  Repeat phrase 10 points -  Total Score 28 -    Immunization History  Administered Date(s) Administered  . Influenza,inj,Quad PF,6+ Mos 04/08/2013, 03/24/2014, 03/08/2015, 05/23/2016, 03/30/2017  . Pneumococcal Conjugate-13 11/04/2014  . Pneumococcal Polysaccharide-23 11/24/2010, 08/29/2012   Screening Tests Health Maintenance  Topic Date Due  . FOOT EXAM  07/15/1944  . OPHTHALMOLOGY EXAM  07/15/1944  . URINE MICROALBUMIN  [birth year]15[birth year]1946  . HEMOGLOBIN A1C  11/20/2016  . DEXA SCAN  03/30/2018 (Originally 07/14/2001)  . TETANUS/TDAP  03/30/2018 (Originally 07/15/1955)  . INFLUENZA VACCINE  Completed  . PNA vac Low Risk Adult  Completed   Cancer Screenings: Lung:  Low Dose CT Chest recommended if Age 48-80 years, 30 pack-year currently smoking OR have quit w/in  15years. Patient does not qualify. Non- smoker. Breast:  Up to date on Mammogram? Yes, stops after age 80  Up to date of Bone Density/Dexa? No, Patient declined  Colorectal: up to date, stops after age 80  Additional Screenings:  Hepatitis B/HIV/Syphillis: not indicated Hepatitis C Screening: not indicated      Plan:   I have personally reviewed and noted the following in the patient's chart:   . Medical and social history . Use of alcohol, tobacco or illicit drugs  . Current medications and supplements . Functional ability and status . Nutritional status . Physical activity . Advanced directives . List of other physicians . Hospitalizations, surgeries, and ER visits in previous 12 months . Vitals . Screenings to include cognitive, depression, and falls . Referrals and appointments  In addition, I have reviewed and discussed with patient certain preventive protocols, quality metrics, and best practice recommendations. A written personalized care plan for preventive services as well as general preventive health recommendations were provided to patient.  Patient declined bone density testing.   Patient will have blood work done at her visit with her PCP.  1. Encounter for Medicare annual wellness exam  2. Need for immunization against influenza - Flu Vaccine QUAD 6+ mos IM (Fluarix)   Janalyn ShyJohnson, Ivin Rosenbloom, LPN  [id number]/30/2018

## 2017-03-30 NOTE — Patient Instructions (Addendum)
   IF you received an x-ray today, you will receive an invoice from [facility]. Please contact [facility] at [phone] with questions or concerns regarding your invoice.   IF you received labwork today, you will receive an invoice from [facility]. Please contact [facility] at [phone] with questions or concerns regarding your invoice.   Our billing staff will not be able to assist you with questions regarding bills from these companies.  You will be contacted with the lab results as soon as they are available. The fastest way to get your results is to activate your My Chart account. Instructions are located on the last page of this paperwork. If you have not heard from us regarding the results in 2 weeks, please contact this office.      Preventive Care 80 Years and Older, Female Preventive care refers to lifestyle choices and visits with your health care provider that can promote health and wellness. What does preventive care include?  A yearly physical exam. This is also called an annual well check.  Dental exams once or twice a year.  Routine eye exams. Ask your health care provider how often you should have your eyes checked.  Personal lifestyle choices, including: ? Daily care of your teeth and gums. ? Regular physical activity. ? Eating a healthy diet. ? Avoiding tobacco and drug use. ? Limiting alcohol use. ? Practicing safe sex. ? Taking low-dose aspirin every day. ? Taking vitamin and mineral supplements as recommended by your health care provider. What happens during an annual well check? The services and screenings done by your health care provider during your annual well check will depend on your age, overall health, lifestyle risk factors, and family history of disease. Counseling Your health care provider may ask you questions about your:  Alcohol use.  Tobacco use.  Drug use.  Emotional well-being.  Home and relationship  well-being.  Sexual activity.  Eating habits.  History of falls.  Memory and ability to understand (cognition).  Work and work environment.  Reproductive health.  Screening You may have the following tests or measurements:  Height, weight, and BMI.  Blood pressure.  Lipid and cholesterol levels. These may be checked every 5 years, or more frequently if you are over 50 years old.  Skin check.  Lung cancer screening. You may have this screening every year starting at age 55 if you have a 30-pack-year history of smoking and currently smoke or have quit within the past 15 years.  Fecal occult blood test (FOBT) of the stool. You may have this test every year starting at age 50.  Flexible sigmoidoscopy or colonoscopy. You may have a sigmoidoscopy every 5 years or a colonoscopy every 10 years starting at age 50.  Hepatitis C blood test.  Hepatitis B blood test.  Sexually transmitted disease (STD) testing.  Diabetes screening. This is done by checking your blood sugar (glucose) after you have not eaten for a while (fasting). You may have this done every 1-3 years.  Bone density scan. This is done to screen for osteoporosis. You may have this done starting at age 65.  Mammogram. This may be done every 1-2 years. Talk to your health care provider about how often you should have regular mammograms.  Talk with your health care provider about your test results, treatment options, and if necessary, the need for more tests. Vaccines Your health care provider may recommend certain vaccines, such as:  Influenza vaccine. This is recommended every year.    Tetanus, diphtheria, and acellular pertussis (Tdap, Td) vaccine. You may need a Td booster every 10 years.  Varicella vaccine. You may need this if you have not been vaccinated.  Zoster vaccine. You may need this after age 60.  Measles, mumps, and rubella (MMR) vaccine. You may need at least one dose of MMR if you were born in  1955 or later. You may also need a second dose.  Pneumococcal 13-valent conjugate (PCV13) vaccine. One dose is recommended after age 65.  Pneumococcal polysaccharide (PPSV23) vaccine. One dose is recommended after age 65.  Meningococcal vaccine. You may need this if you have certain conditions.  Hepatitis A vaccine. You may need this if you have certain conditions or if you travel or work in places where you may be exposed to hepatitis A.  Hepatitis B vaccine. You may need this if you have certain conditions or if you travel or work in places where you may be exposed to hepatitis B.  Haemophilus influenzae type b (Hib) vaccine. You may need this if you have certain conditions.  Talk to your health care provider about which screenings and vaccines you need and how often you need them. This information is not intended to replace advice given to you by your health care provider. Make sure you discuss any questions you have with your health care provider. Document Released: 05/14/2015 Document Revised: 01/05/2016 Document Reviewed: 02/16/2015 Elsevier Interactive Patient Education  2017 Elsevier Inc.  

## 2017-03-30 NOTE — Patient Instructions (Addendum)
Carla Manning , Thank you for taking time to come for your Medicare Wellness Visit. I appreciate your ongoing commitment to your health goals. Please review the following plan we discussed and let me know if I can assist you in the future.   Screening recommendations/referrals: Colonoscopy: up to date, stops after age 80 Mammogram: up to date, stops after age 80 Bone Density: declined Recommended yearly ophthalmology/optometry visit for glaucoma screening and checkup Recommended yearly dental visit for hygiene and checkup  Vaccinations: Influenza vaccine: administered today   Pneumococcal vaccine: up to date Tdap vaccine: declined due to insurance Shingles vaccine: Check with your pharmacy about receiving this vaccine    Advanced directives: Please bring a copy of your POA (Power of RaymondAttorney) and/or Living Will to your next appointment.   Conditions/risks identified: Try to become a little more active daily.   Next appointment: today @ 2:40 pm with Dr. Katrinka BlazingSmith    Preventive Care 7565 Years and Older, Female Preventive care refers to lifestyle choices and visits with your health care provider that can promote health and wellness. What does preventive care include?  A yearly physical exam. This is also called an annual well check.  Dental exams once or twice a year.  Routine eye exams. Ask your health care provider how often you should have your eyes checked.  Personal lifestyle choices, including:  Daily care of your teeth and gums.  Regular physical activity.  Eating a healthy diet.  Avoiding tobacco and drug use.  Limiting alcohol use.  Practicing safe sex.  Taking low-dose aspirin every day.  Taking vitamin and mineral supplements as recommended by your health care provider. What happens during an annual well check? The services and screenings done by your health care provider during your annual well check will depend on your age, overall health, lifestyle risk factors,  and family history of disease. Counseling  Your health care provider may ask you questions about your:  Alcohol use.  Tobacco use.  Drug use.  Emotional well-being.  Home and relationship well-being.  Sexual activity.  Eating habits.  History of falls.  Memory and ability to understand (cognition).  Work and work Astronomerenvironment.  Reproductive health. Screening  You may have the following tests or measurements:  Height, weight, and BMI.  Blood pressure.  Lipid and cholesterol levels. These may be checked every 5 years, or more frequently if you are over 80 years old.  Skin check.  Lung cancer screening. You may have this screening every year starting at age 80 if you have a 30-pack-year history of smoking and currently smoke or have quit within the past 15 years.  Fecal occult blood test (FOBT) of the stool. You may have this test every year starting at age 80.  Flexible sigmoidoscopy or colonoscopy. You may have a sigmoidoscopy every 5 years or a colonoscopy every 10 years starting at age 80.  Hepatitis C blood test.  Hepatitis B blood test.  Sexually transmitted disease (STD) testing.  Diabetes screening. This is done by checking your blood sugar (glucose) after you have not eaten for a while (fasting). You may have this done every 1-3 years.  Bone density scan. This is done to screen for osteoporosis. You may have this done starting at age 80.  Mammogram. This may be done every 1-2 years. Talk to your health care provider about how often you should have regular mammograms. Talk with your health care provider about your test results, treatment options, and if necessary, the  need for more tests. Vaccines  Your health care provider may recommend certain vaccines, such as:  Influenza vaccine. This is recommended every year.  Tetanus, diphtheria, and acellular pertussis (Tdap, Td) vaccine. You may need a Td booster every 10 years.  Zoster vaccine. You may need  this after age 74.  Pneumococcal 13-valent conjugate (PCV13) vaccine. One dose is recommended after age 11.  Pneumococcal polysaccharide (PPSV23) vaccine. One dose is recommended after age 84. Talk to your health care provider about which screenings and vaccines you need and how often you need them. This information is not intended to replace advice given to you by your health care provider. Make sure you discuss any questions you have with your health care provider. Document Released: 05/14/2015 Document Revised: 01/05/2016 Document Reviewed: 02/16/2015 Elsevier Interactive Patient Education  2017 [place] Prevention in the Home Falls can cause injuries. They can happen to people of all ages. There are many things you can do to make your home safe and to help prevent falls. What can I do on the outside of my home?  Regularly fix the edges of walkways and driveways and fix any cracks.  Remove anything that might make you trip as you walk through a door, such as a raised step or threshold.  Trim any bushes or trees on the path to your home.  Use bright outdoor lighting.  Clear any walking paths of anything that might make someone trip, such as rocks or tools.  Regularly check to see if handrails are loose or broken. Make sure that both sides of any steps have handrails.  Any raised decks and porches should have guardrails on the edges.  Have any leaves, snow, or ice cleared regularly.  Use sand or salt on walking paths during winter.  Clean up any spills in your garage right away. This includes oil or grease spills. What can I do in the bathroom?  Use night lights.  Install grab bars by the toilet and in the tub and shower. Do not use towel bars as grab bars.  Use non-skid mats or decals in the tub or shower.  If you need to sit down in the shower, use a plastic, non-slip stool.  Keep the floor dry. Clean up any water that spills on the floor as soon as it  happens.  Remove soap buildup in the tub or shower regularly.  Attach bath mats securely with double-sided non-slip rug tape.  Do not have throw rugs and other things on the floor that can make you trip. What can I do in the bedroom?  Use night lights.  Make sure that you have a light by your bed that is easy to reach.  Do not use any sheets or blankets that are too big for your bed. They should not hang down onto the floor.  Have a firm chair that has side arms. You can use this for support while you get dressed.  Do not have throw rugs and other things on the floor that can make you trip. What can I do in the kitchen?  Clean up any spills right away.  Avoid walking on wet floors.  Keep items that you use a lot in easy-to-reach places.  If you need to reach something above you, use a strong step stool that has a grab bar.  Keep electrical cords out of the way.  Do not use floor polish or wax that makes floors slippery. If you must use wax,  use non-skid floor wax.  Do not have throw rugs and other things on the floor that can make you trip. What can I do with my stairs?  Do not leave any items on the stairs.  Make sure that there are handrails on both sides of the stairs and use them. Fix handrails that are broken or loose. Make sure that handrails are as long as the stairways.  Check any carpeting to make sure that it is firmly attached to the stairs. Fix any carpet that is loose or worn.  Avoid having throw rugs at the top or bottom of the stairs. If you do have throw rugs, attach them to the floor with carpet tape.  Make sure that you have a light switch at the top of the stairs and the bottom of the stairs. If you do not have them, ask someone to add them for you. What else can I do to help prevent falls?  Wear shoes that:  Do not have high heels.  Have rubber bottoms.  Are comfortable and fit you well.  Are closed at the toe. Do not wear sandals.  If you  use a stepladder:  Make sure that it is fully opened. Do not climb a closed stepladder.  Make sure that both sides of the stepladder are locked into place.  Ask someone to hold it for you, if possible.  Clearly mark and make sure that you can see:  Any grab bars or handrails.  First and last steps.  Where the edge of each step is.  Use tools that help you move around (mobility aids) if they are needed. These include:  Canes.  Walkers.  Scooters.  Crutches.  Turn on the lights when you go into a dark area. Replace any light bulbs as soon as they burn out.  Set up your furniture so you have a clear path. Avoid moving your furniture around.  If any of your floors are uneven, fix them.  If there are any pets around you, be aware of where they are.  Review your medicines with your doctor. Some medicines can make you feel dizzy. This can increase your chance of falling. Ask your doctor what other things that you can do to help prevent falls. This information is not intended to replace advice given to you by your health care provider. Make sure you discuss any questions you have with your health care provider. Document Released: 02/11/2009 Document Revised: 09/23/2015 Document Reviewed: 05/22/2014 Elsevier Interactive Patient Education  2017 [employer].

## 2017-03-31 LAB — LIPID PANEL
Chol/HDL Ratio: 2.1 ratio (ref 0.0–4.4)
Cholesterol, Total: 144 mg/dL (ref 100–199)
HDL: 69 mg/dL
LDL Calculated: 60 mg/dL (ref 0–99)
Triglycerides: 73 mg/dL (ref 0–149)
VLDL Cholesterol Cal: 15 mg/dL (ref 5–40)

## 2017-03-31 LAB — CBC WITH DIFFERENTIAL/PLATELET
BASOS ABS: 0 10*3/uL (ref 0.0–0.2)
Basos: 0 %
EOS (ABSOLUTE): 0.1 10*3/uL (ref 0.0–0.4)
Eos: 1 %
HEMOGLOBIN: 13.4 g/dL (ref 11.1–15.9)
Hematocrit: 40.3 % (ref 34.0–46.6)
Immature Grans (Abs): 0 10*3/uL (ref 0.0–0.1)
Immature Granulocytes: 0 %
LYMPHS ABS: 2.4 10*3/uL (ref 0.7–3.1)
Lymphs: 49 %
MCH: 28 pg (ref 26.6–33.0)
MCHC: 33.3 g/dL (ref 31.5–35.7)
MCV: 84 fL (ref 79–97)
MONOCYTES: 6 %
Monocytes Absolute: 0.3 10*3/uL (ref 0.1–0.9)
NEUTROS ABS: 2.2 10*3/uL (ref 1.4–7.0)
Neutrophils: 44 %
Platelets: 133 10*3/uL — ABNORMAL LOW (ref 150–379)
RBC: 4.79 x10E6/uL (ref 3.77–5.28)
RDW: 16 % — ABNORMAL HIGH (ref 12.3–15.4)
WBC: 5 10*3/uL (ref 3.4–10.8)

## 2017-03-31 LAB — COMPREHENSIVE METABOLIC PANEL
A/G RATIO: 1.2 (ref 1.2–2.2)
ALBUMIN: 4.6 g/dL (ref 3.5–4.7)
ALT: 12 IU/L (ref 0–32)
AST: 28 IU/L (ref 0–40)
Alkaline Phosphatase: 177 IU/L — ABNORMAL HIGH (ref 39–117)
BILIRUBIN TOTAL: 0.3 mg/dL (ref 0.0–1.2)
BUN / CREAT RATIO: 12 (ref 12–28)
BUN: 13 mg/dL (ref 8–27)
CALCIUM: 9.3 mg/dL (ref 8.7–10.3)
CO2: 28 mmol/L (ref 20–29)
Chloride: 99 mmol/L (ref 96–106)
Creatinine, Ser: 1.12 mg/dL — ABNORMAL HIGH (ref 0.57–1.00)
GFR, EST AFRICAN AMERICAN: 54 mL/min/{1.73_m2} — AB (ref >59)
GFR, EST NON AFRICAN AMERICAN: 47 mL/min/{1.73_m2} — AB (ref >59)
GLOBULIN, TOTAL: 3.8 g/dL (ref 1.5–4.5)
Glucose: 87 mg/dL (ref 65–99)
POTASSIUM: 4.1 mmol/L (ref 3.5–5.2)
SODIUM: 141 mmol/L (ref 134–144)
TOTAL PROTEIN: 8.4 g/dL (ref 6.0–8.5)

## 2017-04-16 ENCOUNTER — Telehealth: Payer: Self-pay | Admitting: Family Medicine

## 2017-04-16 NOTE — Telephone Encounter (Signed)
Pt refill request

## 2017-04-16 NOTE — Telephone Encounter (Signed)
Copied from CRM [id number]. Topic: Inquiry >> Apr 16, 2017 11:13 AM Yvonna Alanisobinson, Andra M wrote: Reason for CRM: Patient called requesting a refill of HYDROcodone-acetaminophen (NORCO/VICODIN) 5-325 MG tablet. Patient's pharmacy is [address] [place], [place] - [id number] [phone] (Phone) [phone] (Fax).

## 2017-04-17 MED ORDER — HYDROCODONE-ACETAMINOPHEN 5-325 MG PO TABS: ORAL_TABLET | ORAL | 0 refills | Status: DC

## 2017-04-17 NOTE — Telephone Encounter (Signed)
[facility] to [facility]. Please advise daughter.

## 2017-04-19 NOTE — Telephone Encounter (Signed)
Daughter has been informed Rx is ready for pick up.

## 2017-04-26 ENCOUNTER — Other Ambulatory Visit: Payer: Self-pay | Admitting: Physician Assistant

## 2017-05-22 ENCOUNTER — Other Ambulatory Visit: Payer: Self-pay | Admitting: Family Medicine

## 2017-05-22 NOTE — Telephone Encounter (Signed)
Copied from CRM [id number]. Topic: Quick Communication - Rx Refill/Question >> May 22, 2017  8:31 AM Crist InfanteHarrald, Kathy J wrote: Medication: HYDROcodone-acetaminophen (NORCO/VICODIN) 5-325 MG tablet  [facility] [place], [place] - [id number] [phone] (Phone) [phone] (Fax)

## 2017-05-23 NOTE — Telephone Encounter (Signed)
Refill req Hydrocodone sent to Dr. Katrinka BlazingSmith

## 2017-05-24 MED ORDER — HYDROCODONE-ACETAMINOPHEN 5-325 MG PO TABS: ORAL_TABLET | ORAL | 0 refills | Status: DC

## 2017-05-30 ENCOUNTER — Telehealth: Payer: Self-pay | Admitting: Family Medicine

## 2017-05-30 NOTE — Telephone Encounter (Signed)
Patients daughter brought in FMLA forms to be completed by Dr Katrinka BlazingSmith for intermitted time off to take care of her mother. The patient has not been seen here since 03/2017 so I was unsure if the forms could be completed or not. I will place the blank forms in Dr. Lonn GeorgiaSmiths box on 05/30/17 please return to the FMLA/Disability desk within 5-7 business days. Thank you  If you need to see the patient please let me know and I will give her a call and have her come in.

## 2017-06-01 ENCOUNTER — Encounter: Payer: Self-pay | Admitting: Family Medicine

## 2017-06-06 NOTE — Telephone Encounter (Signed)
Today is the 5th business day. I have not heard anything about these forms--or if we can even process them for the patient. Can you please let me know so I can call the patient's daughter and let her know. Thank you!

## 2017-06-06 NOTE — Telephone Encounter (Signed)
Copied from CRM [id number]. Topic: Quick Communication - See Telephone Encounter >> Jun 06, 2017 12:08 PM Floria RavelingStovall, Shana A wrote: CRM for notification. See Telephone encounter for: 06/06/17.  Pt daughter called in ( veronica summers) and is checking the status of her FMLA paperwork.  She said that it was due yesterday and would like someone to call her asap  Best number [phone]

## 2017-06-11 NOTE — Telephone Encounter (Signed)
Patient calling, states date has expired, requesting call back asap. Call back [phone]

## 2017-06-11 NOTE — Telephone Encounter (Signed)
These forms were due last Friday I have not received them back. Have they been completed?

## 2017-06-15 NOTE — Telephone Encounter (Signed)
Please advise, thank you.

## 2017-06-17 NOTE — Telephone Encounter (Signed)
[facility] Monday, 06/18/17.

## 2017-06-18 NOTE — Telephone Encounter (Signed)
Paperwork scanned and faxed on 06/18/17

## 2017-06-19 DIAGNOSIS — Z0271 Encounter for disability determination: Secondary | ICD-10-CM

## 2017-06-25 ENCOUNTER — Other Ambulatory Visit: Payer: Self-pay | Admitting: Family Medicine

## 2017-06-25 NOTE — Telephone Encounter (Signed)
LOV 03/30/18 PCP Kristi Katrinka BlazingSmith [facility]-on file

## 2017-06-25 NOTE — Telephone Encounter (Unsigned)
Copied from CRM [id number]. Topic: Quick Communication - See Telephone Encounter >> Jun 25, 2017 11:22 AM Waymon AmatoBurton, Donna F wrote: CRM for notification. See Telephone encounter for: pt is needing a refill on hydrocodone [facility] -    Best number [phone]  06/25/17.

## 2017-06-29 ENCOUNTER — Other Ambulatory Visit: Payer: Self-pay | Admitting: Family Medicine

## 2017-06-29 NOTE — Telephone Encounter (Signed)
LOV 03/30/17 Dr[facility] [address]

## 2017-06-29 NOTE — Telephone Encounter (Signed)
Copied from CRM [id number]. Topic: Inquiry >> Jun 29, 2017 10:32 AM Yvonna Alanisobinson, Andra M wrote: Reason for CRM: Patient called regarding refill for HYDROcodone-acetaminophen (NORCO/VICODIN) 5-325 MG tablet. Patient called on this past Monday, but has not received anything. Patient will run out of this medication on Sunday. Patient requests a call back today once medication has been prepared.  [phone]- [place], [place] - [address] [phone] (Phone) [phone] (Fax)

## 2017-07-01 MED ORDER — HYDROCODONE-ACETAMINOPHEN 5-325 MG PO TABS: ORAL_TABLET | ORAL | 0 refills | Status: DC

## 2017-07-01 NOTE — Telephone Encounter (Signed)
Refill sent to [facility] per first request.  Please advise family.

## 2017-07-01 NOTE — Telephone Encounter (Signed)
CAll --- hydrocodone prescription ready for pick up at [facility].

## 2017-08-01 ENCOUNTER — Other Ambulatory Visit: Payer: Self-pay | Admitting: Family Medicine

## 2017-08-01 NOTE — Telephone Encounter (Signed)
Hydrocodone-acetaminophen 5-325 LOV: 03/30/17 PCP: Nilda SimmerKristi Smith [facility] [place]

## 2017-08-01 NOTE — Telephone Encounter (Signed)
Copied from CRM [id number]. Topic: Quick Communication - Rx Refill/Question >> Aug 01, 2017  1:42 PM Stephannie LiSimmons, Eudora Guevarra L, NT wrote: Medication: HYDROcodone-acetaminophen (NORCO/VICODIN) 5-325 MG tablet Has the patient contacted their pharmacy? {yes  (Agent: If no, request that the patient contact the pharmacy for the refill. Preferred Pharmacy (with phone number or street name): [facility] [place] - [place], [place] - [id number] [phone] (Phone) [phone] (Fax Agent: Please be advised that RX refills may take up to 3 business days. We ask that you follow-up with your pharmacy.

## 2017-08-02 MED ORDER — HYDROCODONE-ACETAMINOPHEN 5-325 MG PO TABS: ORAL_TABLET | ORAL | 0 refills | Status: DC

## 2017-08-28 ENCOUNTER — Other Ambulatory Visit: Payer: Self-pay | Admitting: Physician Assistant

## 2017-09-05 ENCOUNTER — Telehealth: Payer: Self-pay | Admitting: Family Medicine

## 2017-09-05 NOTE — Telephone Encounter (Signed)
Copied from CRM [id number]. Topic: Quick Communication - See Telephone Encounter >> Sep 05, 2017  1:27 PM Carla Manning wrote: CRM for notification. See Telephone encounter for: 09/05/17. Patient would like her HYDROcodone-acetaminophen (NORCO/VICODIN) 5-325 MG tablet medication refilled and sent to her preferred pharmacy [facility].

## 2017-09-06 NOTE — Telephone Encounter (Signed)
LOV  03/30/17 Dr.Smith Last refill  08/02/17  # 60  0 refill

## 2017-09-09 MED ORDER — HYDROCODONE-ACETAMINOPHEN 5-325 MG PO TABS: ORAL_TABLET | ORAL | 0 refills | Status: DC

## 2017-09-12 NOTE — Telephone Encounter (Addendum)
The rx was sent to wrong pharm. [facility]. Pt no longer gets med from [facility]

## 2017-09-12 NOTE — Telephone Encounter (Signed)
Pt changed pharmacy - Wants Norco [facility]. Pharmacy list updated to show [facility].

## 2017-09-14 NOTE — Telephone Encounter (Signed)
Pt called to check status of medication, and since Dr. Katrinka Blazing is not there can another physician fill it

## 2017-09-14 NOTE — Telephone Encounter (Signed)
Routing note to office

## 2017-09-17 NOTE — Telephone Encounter (Signed)
Pt calling back checking on the status of having pain medication sent to the correct pharmacy.

## 2017-09-18 ENCOUNTER — Telehealth: Payer: Self-pay | Admitting: Physician Assistant

## 2017-09-18 DIAGNOSIS — G609 Hereditary and idiopathic neuropathy, unspecified: Secondary | ICD-10-CM

## 2017-09-18 MED ORDER — HYDROCODONE-ACETAMINOPHEN 5-325 MG PO TABS: ORAL_TABLET | ORAL | 0 refills | Status: DC

## 2017-09-18 NOTE — Telephone Encounter (Signed)
Filled today by PA Leotis Shames. No further action warranted.

## 2017-09-18 NOTE — Telephone Encounter (Signed)
Rx sent to the wrong pharmacy (needs it sent to [facility] due to cost[facility]).  Meds ordered this encounter  Medications  . HYDROcodone-acetaminophen (NORCO/VICODIN) 5-325 MG tablet    Sig: Take 1/2 - 1 tablet twice a day as needed for pain.    Dispense:  60 tablet    Refill:  0    Dx: occipital neuralgia chronic post CVA    Order Specific Question:   Supervising Provider    Answer:   Clelia Croft, EVA N [[id number]]

## 2017-09-25 ENCOUNTER — Other Ambulatory Visit: Payer: Self-pay | Admitting: Physician Assistant

## 2017-09-26 ENCOUNTER — Encounter: Payer: Self-pay | Admitting: Family Medicine

## 2017-10-01 ENCOUNTER — Ambulatory Visit: Payer: Medicare Other | Admitting: Family Medicine

## 2017-10-19 ENCOUNTER — Other Ambulatory Visit: Payer: Self-pay | Admitting: Family Medicine

## 2017-10-19 DIAGNOSIS — G609 Hereditary and idiopathic neuropathy, unspecified: Secondary | ICD-10-CM

## 2017-10-19 NOTE — Telephone Encounter (Signed)
Copied from CRM [id number]. Topic: Quick Communication - See Telephone Encounter >> Oct 19, 2017 10:57 AM Jolayne Hainesaylor, Brittany L wrote: CRM for notification. See Telephone encounter for: 10/19/17.  HYDROcodone-acetaminophen (NORCO/VICODIN) 5-325 MG tablet   [address] [place] - [place], [place] - [id number]

## 2017-10-19 NOTE — Telephone Encounter (Signed)
LOV  03/30/17 Dr. Katrinka BlazingSmith Last refill  09/18/17  # 60 with 0 refill

## 2017-10-21 MED ORDER — HYDROCODONE-ACETAMINOPHEN 5-325 MG PO TABS: ORAL_TABLET | ORAL | 0 refills | Status: DC

## 2017-10-21 NOTE — Telephone Encounter (Signed)
Call --- I approved hydrocodone refill yet only for 1/2 tablet twice daily as patient is overdue for six month follow-up.  I see she is scheduled to see me in July; I recommend moving up her appointment to see me sooner.

## 2017-10-25 ENCOUNTER — Other Ambulatory Visit: Payer: Self-pay | Admitting: Physician Assistant

## 2017-10-26 MED ORDER — LEVETIRACETAM 500 MG PO TABS: 500.0000 mg | ORAL_TABLET | Freq: Two times a day (BID) | ORAL | 0 refills | Status: DC

## 2017-10-26 NOTE — Telephone Encounter (Signed)
Patient is requesting a refill of the following medications: Requested Prescriptions   Pending Prescriptions Disp Refills  . levETIRAcetam (KEPPRA) 500 MG tablet [Pharmacy Med Name: LEVETIRACETAM 500 MG TABLET] 180 tablet 1    Sig: TAKE 1 TABLET BY MOUTH EVERY 12 HOURS  . carvedilol (COREG) 6.25 MG tablet [Pharmacy Med Name: CARVEDILOL 6.25 MG TABLET] 180 tablet 1    Sig: TAKE 1 TABLET BY MOUTH 2 TIMES DAILY WITH FOOD    Date of patient request: 06/28 Last office visit: 03/30/17 Date of last refill: 03/30/17 Last refill amount:  levETIRAcetam 90tab with 3refill and carvedilol 180tab with 1 refill  Follow up time period per chart:  11/19/2017

## 2017-11-12 ENCOUNTER — Other Ambulatory Visit: Payer: Self-pay | Admitting: Family Medicine

## 2017-11-12 DIAGNOSIS — G609 Hereditary and idiopathic neuropathy, unspecified: Secondary | ICD-10-CM

## 2017-11-12 NOTE — Telephone Encounter (Signed)
Copied from CRM [id number]. Topic: Quick Communication - See Telephone Encounter >> Nov 12, 2017 10:21 AM Waymon AmatoBurton, Donna F wrote: Pt is needing a 30 day suppy of hydrocodone -pt will be out of meds tomorrow   [facility]   [facility] number [phone]

## 2017-11-12 NOTE — Telephone Encounter (Signed)
Refill of norco  LOV 03/30/17 Dr. Katrinka BlazingSmith  [facility]   #30  0 refills  [facility] [place] - [place], [place] - [address]

## 2017-11-15 NOTE — Telephone Encounter (Signed)
Patient daughter is calling and states that her mother is completely out of this medication since Tuesday. Please advise.

## 2017-11-17 NOTE — Telephone Encounter (Signed)
Patient is two months overdue for follow-up.  Cannot have rx without appointment.

## 2017-11-19 ENCOUNTER — Encounter: Payer: Self-pay | Admitting: Family Medicine

## 2017-11-19 ENCOUNTER — Ambulatory Visit (INDEPENDENT_AMBULATORY_CARE_PROVIDER_SITE_OTHER): Payer: Medicare Other | Admitting: Family Medicine

## 2017-11-19 ENCOUNTER — Other Ambulatory Visit: Payer: Self-pay

## 2017-11-19 VITALS — BP 122/82 | HR 60 | Temp 98.0°F | Resp 16

## 2017-11-19 DIAGNOSIS — E7800 Pure hypercholesterolemia, unspecified: Secondary | ICD-10-CM

## 2017-11-19 DIAGNOSIS — Z8673 Personal history of transient ischemic attack (TIA), and cerebral infarction without residual deficits: Secondary | ICD-10-CM

## 2017-11-19 DIAGNOSIS — I10 Essential (primary) hypertension: Secondary | ICD-10-CM | POA: Diagnosis not present

## 2017-11-19 DIAGNOSIS — R569 Unspecified convulsions: Secondary | ICD-10-CM | POA: Diagnosis not present

## 2017-11-19 DIAGNOSIS — K219 Gastro-esophageal reflux disease without esophagitis: Secondary | ICD-10-CM | POA: Diagnosis not present

## 2017-11-19 DIAGNOSIS — L602 Onychogryphosis: Secondary | ICD-10-CM

## 2017-11-19 DIAGNOSIS — I672 Cerebral atherosclerosis: Secondary | ICD-10-CM | POA: Diagnosis not present

## 2017-11-19 DIAGNOSIS — E034 Atrophy of thyroid (acquired): Secondary | ICD-10-CM

## 2017-11-19 DIAGNOSIS — R739 Hyperglycemia, unspecified: Secondary | ICD-10-CM

## 2017-11-19 DIAGNOSIS — G609 Hereditary and idiopathic neuropathy, unspecified: Secondary | ICD-10-CM

## 2017-11-19 DIAGNOSIS — I6930 Unspecified sequelae of cerebral infarction: Secondary | ICD-10-CM | POA: Diagnosis not present

## 2017-11-19 DIAGNOSIS — M79609 Pain in unspecified limb: Secondary | ICD-10-CM

## 2017-11-19 DIAGNOSIS — R202 Paresthesia of skin: Secondary | ICD-10-CM

## 2017-11-19 LAB — GLUCOSE, POCT (MANUAL RESULT ENTRY): POC GLUCOSE: 143 mg/dL — AB (ref 70–99)

## 2017-11-19 MED ORDER — AMITRIPTYLINE HCL 10 MG PO TABS: 10.0000 mg | ORAL_TABLET | Freq: Every day | ORAL | 1 refills | Status: DC

## 2017-11-19 MED ORDER — HYDROCODONE-ACETAMINOPHEN 5-325 MG PO TABS: 1.0000 | ORAL_TABLET | Freq: Two times a day (BID) | ORAL | 0 refills | Status: DC | PRN

## 2017-11-19 NOTE — Progress Notes (Signed)
Subjective:    Patient ID: Carla Manning, female    DOB: 1935-02-10, 83 y.o.   MRN: [id number]  11/19/2017  Chronic Conditions    HPI This 83 y.o. female presents with daughter for eight month follow-up of hypertension, hypercholesterolemia, impaired glucose tolerance, hypothyroidism, history of CVA with residual weakness, and chronic pain due to occipital neuralgia.  No changes made to management in November 2018. Labs stable at last visit other than chronically elevated creatinine at 1.12. Refused refill of hydrocodone last week due to lack of follow-up.   UPDATE: Having a lot of burning pain on RIGHT.  Horrible burning pain in RIGHT FOOT at night especially when heavy covers occur.  Nighttime pain only now.  Tried removing covers off of RIGHT FOOT one week ago with significant improvement in pain.  Burning pain had worsened since last visit at night.  Taking gabapentin 100mg  bid; cannot tolerate higher doses of gabapentin due to increased urinary frequency with higher doses.  Was taking Gabapentin four daily initially yet could not tolerate;nocturia x 5.  Minimal urination during the day.  Water and coffee; coffee with breakfast.  One cup of coffee.  Water.  One cup of water during the day and once cup during night.  Appetite is great.  Patient is a very large breakfast that includes to waffles, eggs, bacon, banana, grits, coffee.  And patient has a very small dinner later in the afternoon.  Suffers with right-sided weakness since CVA years ago.  Refuses to let family inspect right foot on a regular basis.  Toenails have not been trimmed in some time.  Patient will let daughter trim toenails yet infrequently.  Patient is now refusing family to apply lotion to right foot.  Patient does suffer with chronic lower extremity edema on the right due to lack of use of right leg since CVA.  No changes in edema since last visit.  BP Readings from Last 3 Encounters:  11/19/17 122/82  03/30/17 140/82   03/30/17 134/74   Wt Readings from Last 3 Encounters:  01/27/13 168 lb (76.2 kg)  08/28/12 184 lb 1.6 oz (83.5 kg)   Immunization History  Administered Date(s) Administered  . Influenza,inj,Quad PF,6+ Mos 04/08/2013, 03/24/2014, 03/08/2015, 05/23/2016, 03/30/2017  . Pneumococcal Conjugate-13 11/04/2014  . Pneumococcal Polysaccharide-23 11/24/2010, 08/29/2012    Review of Systems  Constitutional: Negative for activity change, appetite change, chills, diaphoresis, fatigue, fever and unexpected weight change.  HENT: Negative for congestion, dental problem, drooling, ear discharge, ear pain, facial swelling, hearing loss, mouth sores, nosebleeds, postnasal drip, rhinorrhea, sinus pressure, sneezing, sore throat, tinnitus, trouble swallowing and voice change.   Eyes: Negative for photophobia, pain, discharge, redness, itching and visual disturbance.  Respiratory: Negative for apnea, cough, choking, chest tightness, shortness of breath, wheezing and stridor.   Cardiovascular: Negative for chest pain, palpitations and leg swelling.  Gastrointestinal: Negative for abdominal distention, abdominal pain, anal bleeding, blood in stool, constipation, diarrhea, nausea, rectal pain and vomiting.  Endocrine: Negative for cold intolerance, heat intolerance, polydipsia, polyphagia and polyuria.  Genitourinary: Negative for decreased urine volume, difficulty urinating, dyspareunia, dysuria, enuresis, flank pain, frequency, genital sores, hematuria, menstrual problem, pelvic pain, urgency, vaginal bleeding, vaginal discharge and vaginal pain.  Musculoskeletal: Positive for gait problem. Negative for arthralgias, back pain, joint swelling, myalgias, neck pain and neck stiffness.  Skin: Negative for color change, pallor, rash and wound.  Allergic/Immunologic: Negative for environmental allergies, food allergies and immunocompromised state.  Neurological: Positive for weakness and numbness. Negative  for  dizziness, tremors, seizures, syncope, facial asymmetry, speech difficulty, light-headedness and headaches.  Hematological: Negative for adenopathy. Does not bruise/bleed easily.  Psychiatric/Behavioral: Negative for agitation, behavioral problems, confusion, decreased concentration, dysphoric mood, hallucinations, self-injury, sleep disturbance and suicidal ideas. The patient is not nervous/anxious and is not hyperactive.     Past Medical History:  Diagnosis Date  . Anxiety   . Depression   . GERD (gastroesophageal reflux disease)   . Hyperlipidemia   . Hypertension   . Hypothyroid   . Seizures (HCC)    post CVA  . Stroke [facility])    R sided weakness, aphasia   Past Surgical History:  Procedure Laterality Date  . NO PAST SURGERIES     No Known Allergies No current outpatient medications on file prior to visit.   No current facility-administered medications on file prior to visit.    Social History   Socioeconomic History  . Marital status: Divorced    Spouse name: Not on file  . Number of children: 12  . Years of education: 6  . Highest education level: 6th grade  Occupational History  . Occupation: Retired    Associate Professor: NOT EMPLOYED  Social Needs  . Financial resource strain: Not hard at all  . Food insecurity:    Worry: Never true    Inability: Never true  . Transportation needs:    Medical: No    Non-medical: No  Tobacco Use  . Smoking status: Never Smoker  . Smokeless tobacco: Never Used  Substance and Sexual Activity  . Alcohol use: No    Alcohol/week: 0.0 oz  . Drug use: No  . Sexual activity: Never    Birth control/protection: Post-menopausal  Lifestyle  . Physical activity:    Days per week: 0 days    Minutes per session: 0 min  . Stress: Very much  Relationships  . Social connections:    Talks on phone: More than three times a week    Gets together: More than three times a week    Attends religious service: More than 4 times per year    Active  member of club or organization: No    Attends meetings of clubs or organizations: Never    Relationship status: Divorced  . Intimate partner violence:    Fear of current or ex partner: No    Emotionally abused: No    Physically abused: No    Forced sexual activity: No  Other Topics Concern  . Not on file  Social History Narrative   Marital status: single/divorced.        Children: 12 children; 52 grandchildren; 13 gg.      Lives: with son       Employment: retired/disability.  Forensic scientist.         Tobacco: none      Alcohol: none      ADLs: wheelchair bound; no incontinence; walks to bathroom and down the steps to the car; bathing self; needs some assistance with dressing; can brush teeth herself;no cooking; no driving.  No finances; no grocery shopping.  Caregiver all the time (sons, daughter)      Advanced Directives:  HCPOA [name] [phone]);  FULL CODE.        Patient is now left handed.   Patient has 6 th grade education.   Patient drinks 1 1/2 cup daily.   Family History  Problem Relation Age of Onset  . Diabetes Mother   . Hypertension Daughter   .  Hypertension Daughter   . Diabetes Daughter        Objective:    BP 122/82   Pulse 60   Temp 98 F (36.7 C) (Oral)   Resp 16   LMP 10/06/2016   SpO2 96%  Physical Exam  Constitutional: She is oriented to person, place, and time. She appears well-developed and well-nourished. No distress.  HENT:  Head: Normocephalic and atraumatic.  Right Ear: External ear normal.  Left Ear: External ear normal.  Nose: Nose normal.  Mouth/Throat: Oropharynx is clear and moist.  Eyes: Pupils are equal, round, and reactive to light. Conjunctivae and EOM are normal.  Neck: Normal range of motion and full passive range of motion without pain. Neck supple. No JVD present. Carotid bruit is not present. No thyromegaly present.  Cardiovascular: Normal rate, regular rhythm and normal heart sounds. Exam reveals no  gallop and no friction rub.  No murmur heard. Right foot to mid calf with 2-3+ pitting edema with chronic venous stasis changes to skin without erythema or tenderness.  Diffuse scaling of skin of the foot.  Pulmonary/Chest: Effort normal and breath sounds normal. She has no wheezes. She has no rales.  Abdominal: Soft. Bowel sounds are normal. She exhibits no distension and no mass. There is no tenderness. There is no rebound and no guarding.  Musculoskeletal: She exhibits edema.       Right shoulder: Normal.       Left shoulder: Normal.       Cervical back: Normal.  Lymphadenopathy:    She has no cervical adenopathy.  Neurological: She is alert and oriented to person, place, and time. She displays no tremor. A sensory deficit is present. No cranial nerve deficit. She exhibits abnormal muscle tone. She displays no seizure activity. Coordination normal.  Reflex Scores:      Tricep reflexes are 2+ on the right side and 4+ on the left side.      Bicep reflexes are 2+ on the right side and 4+ on the left side.      Brachioradialis reflexes are 2+ on the right side and 4+ on the left side.      Patellar reflexes are 2+ on the right side and 4+ on the left side.      Achilles reflexes are 2+ on the right side and 4+ on the left side. Skin: Skin is warm and dry. No rash noted. She is not diaphoretic. No erythema. No pallor.  Diffuse scaling of right foot.  Right foot with hypertrophied hyperpigmented thickened toenails throughout.  Psychiatric: She has a normal mood and affect. Her behavior is normal. Judgment and thought content normal.  Nursing note and vitals reviewed.  No results found. Depression screen [facility] 2/9 11/19/2017 03/30/2017 03/30/2017 10/06/2016 05/23/2016  Decreased Interest 0 0 0 0 0  Down, Depressed, Hopeless 0 0 0 0 0  PHQ - 2 Score 0 0 0 0 0  Altered sleeping - - - - -  Tired, decreased energy - - - - -  Change in appetite - - - - -  Feeling bad or failure about yourself  - - -  - -  Trouble concentrating - - - - -  Moving slowly or fidgety/restless - - - - -  Suicidal thoughts - - - - -  PHQ-9 Score - - - - -   Fall Risk  11/19/2017 03/30/2017 03/30/2017 10/06/2016 05/23/2016  Falls in the past year? No No No No No  Number falls in  past yr: - - - - -        Assessment & Plan:   1. Essential hypertension   2. Hypothyroidism due to acquired atrophy of thyroid   3. Cerebrovascular disease, arteriosclerotic, post-stroke   4. Pure hypercholesterolemia   5. Hyperglycemia   6. History of CVA with residual deficit   7. Gastroesophageal reflux disease without esophagitis   8. Partial seizure (HCC)   9. Hereditary and idiopathic peripheral neuropathy   10. Paresthesia and pain of right extremity   11. Thickened nails     History of CVA with right-sided weakness, paresthesias, neuropathy pain: Stable at this time.  Prescription for amitriptyline 10 mg at bedtime to assist with nerve related pain and insomnia.  Continue gabapentin 100 mg at bedtime for now.  Recommend family discontinuing gabapentin in the morning if patient does not suffer with daytime pain.  Consider weaning gabapentin and increasing amitriptyline if patient tolerates new medication and effective for pain.  Refill of hydrocodone provided for neuropathy pain.  Patient usually takes 1 tablet/day but rarely or sometimes will need 2 tablets/day.  Prescriptions for hydrocodone for 3 months sent to pharmacy.  Consider weaning hydrocodone in future if amitriptyline can control pain.  Hypertension, hypercholesterolemia, hypothyroidism, glucose intolerance, GERD, history of seizures associated with CVA: Well-controlled at this time.  Obtain labs for chronic disease management.  Refills provided.  Patient no longer followed by neurology due to noncompliance with appointments and medical follow-up.  No seizures in several years.  Dystrophic nails: Patient has undergone podiatry consultation in the past.  Daughter is  able to trim nails at home and patient is agreeable to allowing daughter to trim her nails.   Orders Placed This Encounter  Procedures  . CBC with Differential/Platelet  . Comprehensive metabolic panel    Order Specific Question:   Has the patient fasted?    Answer:   No  . Lipid panel    Order Specific Question:   Has the patient fasted?    Answer:   No  . Microalbumin, urine  . Hemoglobin A1c  . T4, free  . TSH  . POCT glucose (manual entry)   Meds ordered this encounter  Medications  . DISCONTD: HYDROcodone-acetaminophen (NORCO/VICODIN) 5-325 MG tablet    Sig: Take 1 tablet by mouth 2 (two) times daily as needed for moderate pain.    Dispense:  60 tablet    Refill:  0    Dx: occipital neuralgia chronic post CVA  . amitriptyline (ELAVIL) 10 MG tablet    Sig: Take 1 tablet (10 mg total) by mouth at bedtime.    Dispense:  90 tablet    Refill:  1  . DISCONTD: HYDROcodone-acetaminophen (NORCO/VICODIN) 5-325 MG tablet    Sig: Take 1 tablet by mouth 2 (two) times daily as needed for moderate pain.    Dispense:  60 tablet    Refill:  0    Dx: occipital neuralgia chronic post CVA; do not fill for 30 days  . HYDROcodone-acetaminophen (NORCO/VICODIN) 5-325 MG tablet    Sig: Take 1 tablet by mouth 2 (two) times daily as needed for moderate pain.    Dispense:  60 tablet    Refill:  0    Dx: occipital neuralgia chronic post CVA; do not fill for 60 days  . amLODipine (NORVASC) 10 MG tablet    Sig: Take 1 tablet (10 mg total) by mouth daily.    Dispense:  90 tablet    Refill:  3  . atorvastatin (LIPITOR) 80 MG tablet    Sig: Take 1 tablet (80 mg total) by mouth daily.    Dispense:  90 tablet    Refill:  3  . carvedilol (COREG) 6.25 MG tablet    Sig: TAKE 1 TABLET BY MOUTH 2 TIMES DAILY WITH FOOD    Dispense:  180 tablet    Refill:  3  . clopidogrel (PLAVIX) 75 MG tablet    Sig: Take 1 tablet (75 mg total) by mouth daily.    Dispense:  90 tablet    Refill:  3  . gabapentin  (NEURONTIN) 100 MG capsule    Sig: Take 1 capsule (100 mg total) by mouth 2 (two) times daily.    Dispense:  180 capsule    Refill:  1    This prescription was filled on 08/28/2017. Any refills authorized will be placed on file.  . levETIRAcetam (KEPPRA) 500 MG tablet    Sig: Take 1 tablet (500 mg total) by mouth every 12 (twelve) hours.    Dispense:  180 tablet    Refill:  3    This prescription was filled on 03/28/2017. Any refills authorized will be placed on file.  . levothyroxine (SYNTHROID, LEVOTHROID) 25 MCG tablet    Sig: TAKE 1 TABLET BY MOUTH EVERY MORNING BEFORE breakfast    Dispense:  90 tablet    Refill:  3  . ranitidine (ZANTAC) 150 MG capsule    Sig: Take 1 capsule (150 mg total) by mouth daily.    Dispense:  90 capsule    Refill:  3    Return in about 6 months (around 05/22/2018) for [employer].   Kristi Paulita Fujita, M.D. Primary Care at [facility] [address] [place], [place]  [address] [phone] phone [phone] fax

## 2017-11-19 NOTE — Patient Instructions (Signed)
     IF you received an x-ray today, you will receive an invoice from [facility]. Please contact [facility] at [phone] with questions or concerns regarding your invoice.   IF you received labwork today, you will receive an invoice from [facility]. Please contact [facility] at [phone] with questions or concerns regarding your invoice.   Our billing staff will not be able to assist you with questions regarding bills from these companies.  You will be contacted with the lab results as soon as they are available. The fastest way to get your results is to activate your My Chart account. Instructions are located on the last page of this paperwork. If you have not heard from us regarding the results in 2 weeks, please contact this office.     

## 2017-11-20 ENCOUNTER — Encounter: Payer: Self-pay | Admitting: Family Medicine

## 2017-11-20 LAB — COMPREHENSIVE METABOLIC PANEL
ALBUMIN: 4 g/dL (ref 3.5–4.7)
ALT: 10 IU/L (ref 0–32)
AST: 22 IU/L (ref 0–40)
Albumin/Globulin Ratio: 1.1 — ABNORMAL LOW (ref 1.2–2.2)
Alkaline Phosphatase: 173 IU/L — ABNORMAL HIGH (ref 39–117)
BUN/Creatinine Ratio: 11 — ABNORMAL LOW (ref 12–28)
BUN: 12 mg/dL (ref 8–27)
Bilirubin Total: 0.2 mg/dL (ref 0.0–1.2)
CALCIUM: 8.8 mg/dL (ref 8.7–10.3)
CO2: 25 mmol/L (ref 20–29)
Chloride: 100 mmol/L (ref 96–106)
Creatinine, Ser: 1.06 mg/dL — ABNORMAL HIGH (ref 0.57–1.00)
GFR calc Af Amer: 57 mL/min/{1.73_m2} — ABNORMAL LOW (ref >59)
GFR calc non Af Amer: 49 mL/min/{1.73_m2} — ABNORMAL LOW (ref >59)
GLOBULIN, TOTAL: 3.8 g/dL (ref 1.5–4.5)
Glucose: 137 mg/dL — ABNORMAL HIGH (ref 65–99)
Potassium: 4 mmol/L (ref 3.5–5.2)
SODIUM: 142 mmol/L (ref 134–144)
Total Protein: 7.8 g/dL (ref 6.0–8.5)

## 2017-11-20 LAB — CBC WITH DIFFERENTIAL/PLATELET
Basophils Absolute: 0 10*3/uL (ref 0.0–0.2)
Basos: 0 %
EOS (ABSOLUTE): 0.1 10*3/uL (ref 0.0–0.4)
EOS: 2 %
HEMATOCRIT: 38.1 % (ref 34.0–46.6)
HEMOGLOBIN: 12.8 g/dL (ref 11.1–15.9)
IMMATURE GRANULOCYTES: 0 %
Immature Grans (Abs): 0 10*3/uL (ref 0.0–0.1)
Lymphocytes Absolute: 1.9 10*3/uL (ref 0.7–3.1)
Lymphs: 41 %
MCH: 28 pg (ref 26.6–33.0)
MCHC: 33.6 g/dL (ref 31.5–35.7)
MCV: 83 fL (ref 79–97)
Monocytes Absolute: 0.3 10*3/uL (ref 0.1–0.9)
Monocytes: 6 %
NEUTROS PCT: 51 %
Neutrophils Absolute: 2.5 10*3/uL (ref 1.4–7.0)
Platelets: 140 10*3/uL — ABNORMAL LOW (ref 150–450)
RBC: 4.57 x10E6/uL (ref 3.77–5.28)
RDW: 15.3 % (ref 12.3–15.4)
WBC: 4.8 10*3/uL (ref 3.4–10.8)

## 2017-11-20 LAB — LIPID PANEL
Chol/HDL Ratio: 2.2 ratio (ref 0.0–4.4)
Cholesterol, Total: 128 mg/dL (ref 100–199)
HDL: 57 mg/dL (ref >39)
LDL CALC: 58 mg/dL (ref 0–99)
TRIGLYCERIDES: 64 mg/dL (ref 0–149)
VLDL Cholesterol Cal: 13 mg/dL (ref 5–40)

## 2017-11-20 LAB — T4, FREE: FREE T4: 1.17 ng/dL (ref 0.82–1.77)

## 2017-11-20 LAB — TSH: TSH: 3.27 u[IU]/mL (ref 0.450–4.500)

## 2017-11-20 LAB — HEMOGLOBIN A1C
Est. average glucose Bld gHb Est-mCnc: 117 mg/dL
Hgb A1c MFr Bld: 5.7 % — ABNORMAL HIGH (ref 4.8–5.6)

## 2017-11-20 MED ORDER — LEVETIRACETAM 500 MG PO TABS: 500.0000 mg | ORAL_TABLET | Freq: Two times a day (BID) | ORAL | 3 refills | Status: DC

## 2017-11-20 MED ORDER — RANITIDINE HCL 150 MG PO CAPS: 150.0000 mg | ORAL_CAPSULE | Freq: Every day | ORAL | 3 refills | Status: DC

## 2017-11-20 MED ORDER — AMLODIPINE BESYLATE 10 MG PO TABS: 10.0000 mg | ORAL_TABLET | Freq: Every day | ORAL | 3 refills | Status: DC

## 2017-11-20 MED ORDER — LEVOTHYROXINE SODIUM 25 MCG PO TABS: ORAL_TABLET | ORAL | 3 refills | Status: DC

## 2017-11-20 MED ORDER — GABAPENTIN 100 MG PO CAPS: 100.0000 mg | ORAL_CAPSULE | Freq: Two times a day (BID) | ORAL | 1 refills | Status: DC

## 2017-11-20 MED ORDER — ATORVASTATIN CALCIUM 80 MG PO TABS
80.0000 mg | ORAL_TABLET | Freq: Every day | ORAL | 3 refills | Status: DC
Start: 2017-11-20 — End: 2018-11-18

## 2017-11-20 MED ORDER — CARVEDILOL 6.25 MG PO TABS: ORAL_TABLET | ORAL | 3 refills | Status: DC

## 2017-11-20 MED ORDER — CLOPIDOGREL BISULFATE 75 MG PO TABS: 75.0000 mg | ORAL_TABLET | Freq: Every day | ORAL | 3 refills | Status: DC

## 2018-02-27 ENCOUNTER — Other Ambulatory Visit: Payer: Self-pay | Admitting: Physician Assistant

## 2018-02-27 DIAGNOSIS — R202 Paresthesia of skin: Principal | ICD-10-CM

## 2018-02-27 DIAGNOSIS — M79609 Pain in unspecified limb: Secondary | ICD-10-CM

## 2018-02-27 NOTE — Telephone Encounter (Signed)
Requested medication (s) are due for refill today -yes  Requested medication (s) are on the active medication list -yes  Future visit scheduled -yes  Last refill: 11/19/17  Notes to clinic: Patient is requesting non delegated Rx- sent for provider review  Requested Prescriptions  Pending Prescriptions Disp Refills   HYDROcodone-acetaminophen (NORCO/VICODIN) 5-325 MG tablet [Pharmacy Med Name: HYDROCODONE-ACETAMINOPHEN 5 MG-325MG  TABLET] 60 tablet     Sig: TAKE 1 TABLET BY MOUTH 2 TIMES DAILY AS NEEDED FOR moderate PAIN. DO NOT FILL FOR 60 DAYS     Not Delegated - Analgesics:  Opioid Agonist Combinations Failed - 02/27/2018 10:38 AM      Failed - This refill cannot be delegated      Failed - Urine Drug Screen completed in last 360 days.      Passed - Valid encounter within last 6 months    Recent Outpatient Visits          3 months ago Essential hypertension   Primary Care at [facility], Myrle Sheng, MD   11 months ago Essential hypertension   Primary Care at [facility], Myrle Sheng, MD   1 year ago Essential hypertension   Primary Care at [facility], Myrle Sheng, MD   1 year ago Essential hypertension   Primary Care at [facility], Myrle Sheng, MD   2 years ago Essential hypertension   Primary Care at [facility], Myrle Sheng, MD      Future Appointments            In 2 months Doristine Bosworth, MD Primary Care at [place], [facility]            Requested Prescriptions  Pending Prescriptions Disp Refills   HYDROcodone-acetaminophen (NORCO/VICODIN) 5-325 MG tablet [Pharmacy Med Name: HYDROCODONE-ACETAMINOPHEN 5 MG-325MG  TABLET] 60 tablet     Sig: TAKE 1 TABLET BY MOUTH 2 TIMES DAILY AS NEEDED FOR moderate PAIN. DO NOT FILL FOR 60 DAYS     Not Delegated - Analgesics:  Opioid Agonist Combinations Failed - 02/27/2018 10:38 AM      Failed - This refill cannot be delegated      Failed - Urine Drug Screen completed in last 360 days.      Passed - Valid encounter within last 6 months     Recent Outpatient Visits          3 months ago Essential hypertension   Primary Care at [facility], Myrle Sheng, MD   11 months ago Essential hypertension   Primary Care at [facility], Myrle Sheng, MD   1 year ago Essential hypertension   Primary Care at [facility], Myrle Sheng, MD   1 year ago Essential hypertension   Primary Care at [facility], Myrle Sheng, MD   2 years ago Essential hypertension   Primary Care at [facility], Myrle Sheng, MD      Future Appointments            In 2 months Doristine Bosworth, MD Primary Care at [place], [facility]

## 2018-04-01 ENCOUNTER — Other Ambulatory Visit: Payer: Self-pay | Admitting: Physician Assistant

## 2018-04-01 DIAGNOSIS — R202 Paresthesia of skin: Principal | ICD-10-CM

## 2018-04-01 DIAGNOSIS — M79609 Pain in unspecified limb: Secondary | ICD-10-CM

## 2018-04-02 ENCOUNTER — Other Ambulatory Visit: Payer: Self-pay | Admitting: *Deleted

## 2018-04-02 DIAGNOSIS — R202 Paresthesia of skin: Principal | ICD-10-CM

## 2018-04-02 DIAGNOSIS — M79609 Pain in unspecified limb: Secondary | ICD-10-CM

## 2018-04-02 MED ORDER — AMITRIPTYLINE HCL 10 MG PO TABS: 10.0000 mg | ORAL_TABLET | Freq: Every day | ORAL | 0 refills | Status: DC

## 2018-04-02 NOTE — Progress Notes (Signed)
Requested Prescriptions  Pending Prescriptions Disp Refills  . amitriptyline (ELAVIL) 10 MG tablet 60 tablet 0    Sig: Take 1 tablet (10 mg total) by mouth at bedtime.     There is no refill protocol information for this order

## 2018-04-08 ENCOUNTER — Other Ambulatory Visit: Payer: Self-pay | Admitting: Physician Assistant

## 2018-04-08 DIAGNOSIS — M79609 Pain in unspecified limb: Secondary | ICD-10-CM

## 2018-04-08 DIAGNOSIS — R202 Paresthesia of skin: Principal | ICD-10-CM

## 2018-04-08 NOTE — Telephone Encounter (Signed)
Patient daughter called  needs a prescription for hydrocodone she has an appointment scheduled  05-27-2017 for transfer of care from Dr. Katrinka BlazingSmith.   Per pharmacy last prescription was delivered 01-31-2018.

## 2018-04-08 NOTE — Telephone Encounter (Signed)
[name], dtr, called very upset that she was advised an appt is likely needed before refill of hydrocodone. She states Dr. Katrinka BlazingSmith assured her refills would be sent in by her new PCP Dr. Creta LevinStallings as long as an appt was scheduled to continue with visits every 6  Months. Please call Suzette BattiestVeronica with any issues/concerns.  Per Raynelle FanningJulie, I advised her to expect f/u in 48-72 hours.  [facility] is correct

## 2018-04-09 NOTE — Telephone Encounter (Signed)
Please advise 

## 2018-04-12 MED ORDER — HYDROCODONE-ACETAMINOPHEN 5-325 MG PO TABS: 1.0000 | ORAL_TABLET | Freq: Two times a day (BID) | ORAL | 0 refills | Status: DC | PRN

## 2018-04-12 NOTE — Telephone Encounter (Signed)
Please notify the patient that I sent in 20 tabs

## 2018-04-12 NOTE — Telephone Encounter (Signed)
Patient's daughter, Carla Manning, [phone] is calling to check on the status of the medication refill. Spoke with Barbara CowerJason advised Ms. Manning that Production designer, theatre/television/filmmanager will call her back. Thank you.

## 2018-04-12 NOTE — Telephone Encounter (Signed)
Call to pt's daughter, Suzette BattiestVeronica.  Reviewed chart and confirmed expected 6 month f/u per Dr Michaelle CopasSmith's July 2019 OV note.  Dr. Creta LevinStallings not familiar with pt and would like to establish prior to writing ongoing pain med.  Per Dr. Creta LevinStallings, pt scheduled for 12/16 at 2:00.  Will send enough pain med to get through to appt.  Daughter verbalizes understanding.  [facility] confirmed.

## 2018-04-12 NOTE — Addendum Note (Signed)
Addended by: Collie SiadSTALLINGS, Laurens Matheny A on: 04/12/2018 04:23 PM   Modules accepted: Orders

## 2018-04-12 NOTE — Telephone Encounter (Signed)
Pt aware that Rx will be sent in to get through to 12/16 2:00 appt.

## 2018-04-15 ENCOUNTER — Other Ambulatory Visit: Payer: Self-pay

## 2018-04-15 ENCOUNTER — Ambulatory Visit (INDEPENDENT_AMBULATORY_CARE_PROVIDER_SITE_OTHER): Payer: Medicare Other | Admitting: Family Medicine

## 2018-04-15 ENCOUNTER — Encounter: Payer: Self-pay | Admitting: Family Medicine

## 2018-04-15 VITALS — BP 123/76 | HR 51 | Temp 98.4°F

## 2018-04-15 DIAGNOSIS — Z8673 Personal history of transient ischemic attack (TIA), and cerebral infarction without residual deficits: Secondary | ICD-10-CM

## 2018-04-15 DIAGNOSIS — Z5181 Encounter for therapeutic drug level monitoring: Secondary | ICD-10-CM

## 2018-04-15 DIAGNOSIS — Z23 Encounter for immunization: Secondary | ICD-10-CM | POA: Diagnosis not present

## 2018-04-15 DIAGNOSIS — M79609 Pain in unspecified limb: Secondary | ICD-10-CM | POA: Diagnosis not present

## 2018-04-15 DIAGNOSIS — E034 Atrophy of thyroid (acquired): Secondary | ICD-10-CM

## 2018-04-15 DIAGNOSIS — I10 Essential (primary) hypertension: Secondary | ICD-10-CM

## 2018-04-15 DIAGNOSIS — I672 Cerebral atherosclerosis: Secondary | ICD-10-CM

## 2018-04-15 DIAGNOSIS — R202 Paresthesia of skin: Secondary | ICD-10-CM

## 2018-04-15 MED ORDER — HYDROCODONE-ACETAMINOPHEN 5-325 MG PO TABS: 2.0000 | ORAL_TABLET | Freq: Two times a day (BID) | ORAL | 0 refills | Status: DC

## 2018-04-15 NOTE — Progress Notes (Signed)
Established Patient Office Visit  Subjective:  Patient ID: Carla Manning, female    DOB: July 05, 1934  Age: 83 y.o. MRN: [id number]  CC:  Chief Complaint  Patient presents with  . Medication Refill    need a refill on pain medication     HPI [name] presents for   [name] her daughter is with her today.  Chronic Pain She take norco 5-31m bid for severe neuropathy She takes gabapentin as well She gets up 5 times at night and she reports that her pain makes her get up to use the bathroom She lives with her brother and there is someone there with her 24 hours a day  Seizures and CVA She has a history of CVA times 2 and seizures No recent seizures  Her seizures were thought to be attributed to the stroke  Hypertension  She also has hypertension and stroke She takes plavix, lipitor, coreg, amlodipine BP Readings from Last 3 Encounters:  04/15/18 123/76  11/19/17 122/82  03/30/17 140/82   Her daughter reports that the salt content in the food is kept low and she is also fed a nutritious diet  Hypothyroidism Lab Results  Component Value Date   TSH 3.270 11/19/2017    She has a history of hypothyroidism She reports that her mother takes her medication daily She does not seem to have any loss of weight or poor energy.  Lab Results  Component Value Date   TSH 3.270 11/19/2017     Past Medical History:  Diagnosis Date  . Anxiety   . Depression   . GERD (gastroesophageal reflux disease)   . Hyperlipidemia   . Hypertension   . Hypothyroid   . [place]    post CV[place]  . Stroke ([facility]    R sided weakness, aphasia    Past Surgical History:  Procedure Laterality Date  . NO PAST SURGERIES      Family History  Problem Relation Age of Onset  . Diabetes Mother   . Hypertension Daughter   . Hypertension Daughter   . Diabetes Daughter     Social History   Socioeconomic History  . Marital status: Divorced    Spouse name: Not on file    . Number of children: 12  . Years of education: 6  . Highest education level: 6th grade  Occupational History  . 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 Financial resource strain: Not hard at all  . Food insecurity:    Worry: Never true    Inability: Never true  . Transportation needs:    Medical: No    Non-medical: No  Tobacco Use  . Smoking status: Never Smoker  . Smokeless tobacco: Never Used  Substance and Sexual Activity  . Alcohol use: No    Alcohol/week: 0.0 standard drinks  . Drug use: No  . Sexual activity: Never    Birth control/protection: Post-menopausal  Lifestyle  . Physical activity:    Days per week: 0 days    Minutes per session: 0 min  . Stress: Very much  Relationships  . Social connections:    Talks on phone: More than three times a week    Gets together: More than three times a week    Attends religious service: More than 4 times per year    Active member of club or organization: No    Attends meetings of clubs or organizations: Never    Relationship  status: Divorced  . Intimate partner violence:    Fear of current or ex partner: No    Emotionally abused: No    Physically abused: No    Forced sexual activity: No  Other Topics Concern  . Not on file  Social History Narrative   Marital status: single/divorced.        Children: 12 children; 52 grandchildren; 42 gg.      Lives: with son       Employment: retired/disability.  Scientist, product/process development.         Tobacco: none      Alcohol: none      ADLs: wheelchair bound; no incontinence; walks to bathroom and down the steps to the car; bathing self; needs some assistance with dressing; can brush teeth herself;no cooking; no driving.  No finances; no grocery shopping.  Caregiver all the time (sons, daughter)      Advanced Directives:  HCPOA [name] [phone]);  FULL CODE.        Patient is now left handed.   Patient has 6 th grade education.   Patient drinks 1  1/2 cup daily.    Outpatient Medications Prior to Visit  Medication Sig Dispense Refill  . amitriptyline (ELAVIL) 10 MG tablet Take 1 tablet (10 mg total) by mouth at bedtime. 60 tablet 0  . amLODipine (NORVASC) 10 MG tablet Take 1 tablet (10 mg total) by mouth daily. 90 tablet 3  . atorvastatin (LIPITOR) 80 MG tablet Take 1 tablet (80 mg total) by mouth daily. 90 tablet 3  . carvedilol (COREG) 6.25 MG tablet TAKE 1 TABLET BY MOUTH 2 TIMES DAILY WITH FOOD 180 tablet 3  . clopidogrel (PLAVIX) 75 MG tablet Take 1 tablet (75 mg total) by mouth daily. 90 tablet 3  . gabapentin (NEURONTIN) 100 MG capsule Take 1 capsule (100 mg total) by mouth 2 (two) times daily. 180 capsule 1  . levETIRAcetam (KEPPRA) 500 MG tablet Take 1 tablet (500 mg total) by mouth every 12 (twelve) hours. 180 tablet 3  . levothyroxine (SYNTHROID, LEVOTHROID) 25 MCG tablet TAKE 1 TABLET BY MOUTH EVERY MORNING BEFORE breakfast 90 tablet 3  . ranitidine (ZANTAC) 150 MG capsule Take 1 capsule (150 mg total) by mouth daily. 90 capsule 3  . HYDROcodone-acetaminophen (NORCO/VICODIN) 5-325 MG tablet Take 1 tablet by mouth 2 (two) times daily as needed for moderate pain. 20 tablet 0   No facility-administered medications prior to visit.     No Known Allergies  ROS Review of Systems Patient has difficulty speaking so ROS is limited, ROS per patient's daughter Review of Systems  Constitutional: Negative for appetite change, chills and fever.  HENT: Negative for congestion, nosebleeds   Respiratory: Negative for cough, wheezing.   Gastrointestinal: Negative for diarrhea, or vomiting.  Skin: no rashes or lesions See HPI. All other review of systems negative.     Objective:    Physical Exam  BP 123/76 (BP Location: Right Arm, Patient Position: Sitting, Cuff Size: Large)   Pulse (!) 51   Temp 98.4 F (36.9 C) (Oral)   LMP 10/06/2016  Wt Readings from Last 3 Encounters:  01/27/13 168 lb (76.2 kg)  08/28/12 184 lb 1.6 oz  (83.5 kg)   Physical Exam  Constitutional: Oriented to person, place, and time. Appears well-developed and well-nourished.  HENT:  Head: Normocephalic and atraumatic.  Eyes: Conjunctivae and EOM are normal.  Cardiovascular: Normal rate, regular rhythm, normal heart sounds and intact distal pulses.  No murmur  heard. Pulmonary/Chest: Effort normal and breath sounds normal. No stridor. No respiratory distress. Has no wheezes.  Neurological: pt with right side weakness with motor in the RUE 1/5 and RLE 1/5 with spasticity  No seizure like activity, no fasiculations Skin: Skin is warm. Capillary refill takes less than 2 seconds.  Psychiatric: Has a normal mood and affect. Behavior is normal. Judgment and thought content normal.    Health Maintenance Due  Topic Date Due  . FOOT EXAM  07/15/1946  . OPHTHALMOLOGY EXAM  07/15/1946  . URINE MICROALBUMIN  07/15/1946  . TETANUS/TDAP  07/15/1955  . DEXA SCAN  07/14/2001    There are no preventive care reminders to display for this patient.  Lab Results  Component Value Date   TSH 3.270 11/19/2017   Lab Results  Component Value Date   WBC 4.8 11/19/2017   HGB 12.8 11/19/2017   HCT 38.1 11/19/2017   MCV 83 11/19/2017   PLT 140 (L) 11/19/2017   Lab Results  Component Value Date   NA 142 04/15/2018   K 3.8 04/15/2018   CO2 25 04/15/2018   GLUCOSE 95 04/15/2018   BUN 11 04/15/2018   CREATININE 1.02 (H) 04/15/2018   BILITOT 0.3 04/15/2018   ALKPHOS 207 (H) 04/15/2018   AST 30 04/15/2018   ALT 15 04/15/2018   PROT 7.5 04/15/2018   ALBUMIN 4.0 04/15/2018   CALCIUM 8.8 04/15/2018   ANIONGAP 13 02/10/2014   Lab Results  Component Value Date   CHOL 128 11/19/2017   Lab Results  Component Value Date   HDL 57 11/19/2017   Lab Results  Component Value Date   LDLCALC 58 11/19/2017   Lab Results  Component Value Date   TRIG 64 11/19/2017   Lab Results  Component Value Date   CHOLHDL 2.2 11/19/2017   Lab Results    Component Value Date   HGBA1C 5.7 (H) 11/19/2017      Assessment & Plan:   Problem List Items Addressed This Visit      Cardiovascular and Mediastinum   Cerebrovascular disease, arteriosclerotic, post-stroke   Essential hypertension  bp at goal, compliant with medications, fall precautions reviewed     Endocrine   Hypothyroidism  - stable on current medication dose      Other   Paresthesia and pain of right extremity - Primary -  Continue norco Pt stable on current dose and is monitored closely by family   Relevant Medications   HYDROcodone-acetaminophen (NORCO/VICODIN) 5-325 MG tablet    Other Visit Diagnoses    Flu vaccine need       Relevant Orders   Flu vaccine HIGH DOSE PF (Fluzone High dose) (Completed)   Encounter for medication monitoring       Relevant Orders   CMP14+EGFR (Completed)      Meds ordered this encounter  Medications  . HYDROcodone-acetaminophen (NORCO/VICODIN) 5-325 MG tablet    Sig: Take 2 tablets by mouth 2 (two) times daily.    Dispense:  60 tablet    Refill:  0    Dx: occipital neuralgia chronic post CVA; do not fill for 60 days    Follow-up: Return in about 6 months (around 10/15/2018) for medicare wellness visit, labs.    Forrest Moron, MD

## 2018-04-15 NOTE — Patient Instructions (Signed)
° ° ° °  If you have lab work done today you will be contacted with your lab results within the next 2 weeks.  If you have not heard from us then please contact us. The fastest way to get your results is to register for My Chart. ° ° °IF you received an x-ray today, you will receive an invoice from [facility]. Please contact [facility] at [phone] with questions or concerns regarding your invoice.  ° °IF you received labwork today, you will receive an invoice from [facility]. Please contact [facility] at [phone] with questions or concerns regarding your invoice.  ° °Our billing staff will not be able to assist you with questions regarding bills from these companies. ° °You will be contacted with the lab results as soon as they are available. The fastest way to get your results is to activate your My Chart account. Instructions are located on the last page of this paperwork. If you have not heard from us regarding the results in 2 weeks, please contact this office. °  ° ° ° °

## 2018-04-16 LAB — CMP14+EGFR
A/G RATIO: 1.1 — AB (ref 1.2–2.2)
ALT: 15 IU/L (ref 0–32)
AST: 30 IU/L (ref 0–40)
Albumin: 4 g/dL (ref 3.5–4.7)
Alkaline Phosphatase: 207 IU/L — ABNORMAL HIGH (ref 39–117)
BUN / CREAT RATIO: 11 — AB (ref 12–28)
BUN: 11 mg/dL (ref 8–27)
Bilirubin Total: 0.3 mg/dL (ref 0.0–1.2)
CO2: 25 mmol/L (ref 20–29)
Calcium: 8.8 mg/dL (ref 8.7–10.3)
Chloride: 102 mmol/L (ref 96–106)
Creatinine, Ser: 1.02 mg/dL — ABNORMAL HIGH (ref 0.57–1.00)
GFR calc Af Amer: 60 mL/min/{1.73_m2} (ref >59)
GFR calc non Af Amer: 52 mL/min/{1.73_m2} — ABNORMAL LOW (ref >59)
Globulin, Total: 3.5 g/dL (ref 1.5–4.5)
Glucose: 95 mg/dL (ref 65–99)
Potassium: 3.8 mmol/L (ref 3.5–5.2)
Sodium: 142 mmol/L (ref 134–144)
Total Protein: 7.5 g/dL (ref 6.0–8.5)

## 2018-04-25 ENCOUNTER — Emergency Department (HOSPITAL_COMMUNITY)
Admission: EM | Admit: 2018-04-25 | Discharge: 2018-04-26 | Disposition: A | Discharge status: Home / Self Care | Payer: Medicare Other | Attending: Emergency Medicine | Admitting: Emergency Medicine

## 2018-04-25 ENCOUNTER — Emergency Department (HOSPITAL_COMMUNITY): Payer: Medicare Other

## 2018-04-25 ENCOUNTER — Other Ambulatory Visit: Payer: Self-pay

## 2018-04-25 DIAGNOSIS — Y999 Unspecified external cause status: Secondary | ICD-10-CM | POA: Diagnosis not present

## 2018-04-25 DIAGNOSIS — S42201A Unspecified fracture of upper end of right humerus, initial encounter for closed fracture: Secondary | ICD-10-CM | POA: Diagnosis not present

## 2018-04-25 DIAGNOSIS — R531 Weakness: Secondary | ICD-10-CM | POA: Insufficient documentation

## 2018-04-25 DIAGNOSIS — R404 Transient alteration of awareness: Secondary | ICD-10-CM | POA: Diagnosis not present

## 2018-04-25 DIAGNOSIS — W19XXXA Unspecified fall, initial encounter: Secondary | ICD-10-CM | POA: Insufficient documentation

## 2018-04-25 DIAGNOSIS — I10 Essential (primary) hypertension: Secondary | ICD-10-CM | POA: Insufficient documentation

## 2018-04-25 DIAGNOSIS — R52 Pain, unspecified: Secondary | ICD-10-CM | POA: Diagnosis not present

## 2018-04-25 DIAGNOSIS — M79631 Pain in right forearm: Secondary | ICD-10-CM | POA: Diagnosis not present

## 2018-04-25 DIAGNOSIS — S4991XA Unspecified injury of right shoulder and upper arm, initial encounter: Secondary | ICD-10-CM | POA: Diagnosis present

## 2018-04-25 DIAGNOSIS — S42321A Displaced transverse fracture of shaft of humerus, right arm, initial encounter for closed fracture: Secondary | ICD-10-CM | POA: Diagnosis not present

## 2018-04-25 DIAGNOSIS — Z743 Need for continuous supervision: Secondary | ICD-10-CM | POA: Diagnosis not present

## 2018-04-25 DIAGNOSIS — Y929 Unspecified place or not applicable: Secondary | ICD-10-CM | POA: Insufficient documentation

## 2018-04-25 DIAGNOSIS — M791 Myalgia, unspecified site: Secondary | ICD-10-CM | POA: Diagnosis not present

## 2018-04-25 DIAGNOSIS — R279 Unspecified lack of coordination: Secondary | ICD-10-CM | POA: Diagnosis not present

## 2018-04-25 DIAGNOSIS — S59911A Unspecified injury of right forearm, initial encounter: Secondary | ICD-10-CM | POA: Diagnosis not present

## 2018-04-25 DIAGNOSIS — S0990XA Unspecified injury of head, initial encounter: Secondary | ICD-10-CM | POA: Diagnosis not present

## 2018-04-25 DIAGNOSIS — S199XXA Unspecified injury of neck, initial encounter: Secondary | ICD-10-CM | POA: Diagnosis not present

## 2018-04-25 DIAGNOSIS — E039 Hypothyroidism, unspecified: Secondary | ICD-10-CM | POA: Diagnosis not present

## 2018-04-25 DIAGNOSIS — Z79899 Other long term (current) drug therapy: Secondary | ICD-10-CM | POA: Diagnosis not present

## 2018-04-25 DIAGNOSIS — Y939 Activity, unspecified: Secondary | ICD-10-CM | POA: Insufficient documentation

## 2018-04-25 LAB — COMPREHENSIVE METABOLIC PANEL WITH GFR
ALT: 17 U/L (ref 0–44)
AST: 31 U/L (ref 15–41)
Albumin: 3.1 g/dL — ABNORMAL LOW (ref 3.5–5.0)
Alkaline Phosphatase: 121 U/L (ref 38–126)
Anion gap: 4 — ABNORMAL LOW (ref 5–15)
BUN: 10 mg/dL (ref 8–23)
CO2: 31 mmol/L (ref 22–32)
Calcium: 8.3 mg/dL — ABNORMAL LOW (ref 8.9–10.3)
Chloride: 105 mmol/L (ref 98–111)
Creatinine, Ser: 1.02 mg/dL — ABNORMAL HIGH (ref 0.44–1.00)
GFR calc Af Amer: 60 mL/min — ABNORMAL LOW
GFR calc non Af Amer: 52 mL/min — ABNORMAL LOW
Glucose, Bld: 130 mg/dL — ABNORMAL HIGH (ref 70–99)
Potassium: 3.7 mmol/L (ref 3.5–5.1)
Sodium: 140 mmol/L (ref 135–145)
Total Bilirubin: 0.5 mg/dL (ref 0.3–1.2)
Total Protein: 7.6 g/dL (ref 6.5–8.1)

## 2018-04-25 LAB — CBC WITH DIFFERENTIAL/PLATELET
Abs Immature Granulocytes: 0.02 K/uL (ref 0.00–0.07)
Basophils Absolute: 0 K/uL (ref 0.0–0.1)
Basophils Relative: 0 %
Eosinophils Absolute: 0 K/uL (ref 0.0–0.5)
Eosinophils Relative: 0 %
HCT: 31.4 % — ABNORMAL LOW (ref 36.0–46.0)
Hemoglobin: 10.5 g/dL — ABNORMAL LOW (ref 12.0–15.0)
Immature Granulocytes: 0 %
Lymphocytes Relative: 25 %
Lymphs Abs: 1.7 K/uL (ref 0.7–4.0)
MCH: 27.2 pg (ref 26.0–34.0)
MCHC: 33.4 g/dL (ref 30.0–36.0)
MCV: 81.3 fL (ref 80.0–100.0)
Monocytes Absolute: 0.7 K/uL (ref 0.1–1.0)
Monocytes Relative: 11 %
Neutro Abs: 4.3 K/uL (ref 1.7–7.7)
Neutrophils Relative %: 64 %
Platelets: 92 K/uL — ABNORMAL LOW (ref 150–400)
RBC: 3.86 MIL/uL — ABNORMAL LOW (ref 3.87–5.11)
RDW: 14.8 % (ref 11.5–15.5)
WBC: 6.8 K/uL (ref 4.0–10.5)
nRBC: 0 % (ref 0.0–0.2)

## 2018-04-25 LAB — URINALYSIS, ROUTINE W REFLEX MICROSCOPIC
Bilirubin Urine: NEGATIVE
Glucose, UA: NEGATIVE mg/dL
Hgb urine dipstick: NEGATIVE
Ketones, ur: NEGATIVE mg/dL
Leukocytes, UA: NEGATIVE
Nitrite: NEGATIVE
Protein, ur: NEGATIVE mg/dL
Specific Gravity, Urine: 1.017 (ref 1.005–1.030)
pH: 7 (ref 5.0–8.0)

## 2018-04-25 NOTE — ED Provider Notes (Signed)
[facility]   CSN: [id number] Arrival date & time: 04/25/18  1757     History   Chief Complaint Chief Complaint  Patient presents with  . Fall  . Weakness    HPI Carla Manning is a 83 y.o. female.  HPI Patient has history of previous stroke with right-sided weakness and aphasia.  Difficult historian.  Most history is coming from the family.  Daughter states patient has fallen twice since Sunday.  1 of those falls was witnessed.  No definite head trauma.  She has been complaining of right arm pain since fall.  Daughter also is concerned that she has increased left-sided weakness.  No vomiting or diarrhea.  No fever or chills.  Patient is otherwise at her mental baseline. Past Medical History:  Diagnosis Date  . Anxiety   . Depression   . GERD (gastroesophageal reflux disease)   . Hyperlipidemia   . Hypertension   . Hypothyroid   . Seizures (HCC)    post CVA  . Stroke [facility](HCC)    R sided weakness, aphasia    Patient Active Problem List   Diagnosis Date Noted  . History of CVA with residual deficit 07/20/2015  . Vitamin B6 deficiency neuropathy 06/25/2014  . Occipital neuralgia 06/25/2014  . Essential hypertension 06/25/2014  . HLD (hyperlipidemia) 06/25/2014  . Paresthesia and pain of right extremity 04/09/2013  . Hypothyroidism 08/31/2012  . Partial seizure (H[facility]) 08/29/2012  . GERD (gastroesophageal reflux disease) 05/30/2011  . Cerebrovascular disease, arteriosclerotic, post-stroke 05/13/2010    Past Surgical History:  Procedure Laterality Date  . NO PAST SURGERIES       OB History   No obstetric history on file.      Home Medications    Prior to Admission medications   Medication Sig Start Date End Date Taking? Authorizing Provider  amitriptyline (ELAVIL) 10 MG tablet Take 1 tablet (10 mg total) by mouth at bedtime. 04/02/18  Yes Myles LippsSantiago, Irma M, MD  amLODipine (NORVASC) 10 MG tablet Take 1 tablet  (10 mg total) by mouth daily. 11/20/17  Yes Ethelda ChickSmith, Kristi M, MD  atorvastatin (LIPITOR) 80 MG tablet Take 1 tablet (80 mg total) by mouth daily. Patient taking differently: Take 80 mg by mouth daily at 6 PM.  11/20/17  Yes Ethelda ChickSmith, Kristi M, MD  carvedilol (COREG) 6.25 MG tablet TAKE 1 TABLET BY MOUTH 2 TIMES DAILY WITH FOOD Patient taking differently: Take 6.25 mg by mouth 2 (two) times daily with a meal.  11/20/17  Yes Ethelda ChickSmith, Kristi M, MD  clopidogrel (PLAVIX) 75 MG tablet Take 1 tablet (75 mg total) by mouth daily. 11/20/17  Yes Ethelda ChickSmith, Kristi M, MD  gabapentin (NEURONTIN) 100 MG capsule Take 1 capsule (100 mg total) by mouth 2 (two) times daily. Patient taking differently: Take 100 mg by mouth at bedtime.  11/20/17  Yes Ethelda ChickSmith, Kristi M, MD  HYDROcodone-acetaminophen (NORCO/VICODIN) 5-325 MG tablet Take 2 tablets by mouth 2 (two) times daily. 04/15/18  Yes Doristine BosworthStallings, Zoe A, MD  levETIRAcetam (KEPPRA) 500 MG tablet Take 1 tablet (500 mg total) by mouth every 12 (twelve) hours. 11/20/17  Yes Ethelda ChickSmith, Kristi M, MD  levothyroxine (SYNTHROID, LEVOTHROID) 25 MCG tablet TAKE 1 TABLET BY MOUTH EVERY MORNING BEFORE breakfast Patient taking differently: Take 25 mcg by mouth daily before breakfast.  11/20/17  Yes Ethelda ChickSmith, Kristi M, MD  ranitidine (ZANTAC) 150 MG capsule Take 1 capsule (150 mg total) by mouth daily. 11/20/17  Yes Katrinka BlazingSmith,  Myrle Sheng, MD    Family History Family History  Problem Relation Age of Onset  . Diabetes Mother   . Hypertension Daughter   . Hypertension Daughter   . Diabetes Daughter     Social History Social History   Tobacco Use  . Smoking status: Never Smoker  . Smokeless tobacco: Never Used  Substance Use Topics  . Alcohol use: No    Alcohol/week: 0.0 standard drinks  . Drug use: No     Allergies   Patient has no known allergies.   Review of Systems Review of Systems  Constitutional: Negative for chills and fever.  HENT: Negative for facial swelling.   Eyes: Negative for  visual disturbance.  Respiratory: Negative for shortness of breath.   Cardiovascular: Negative for chest pain.  Gastrointestinal: Negative for abdominal pain, diarrhea and vomiting.  Musculoskeletal: Positive for myalgias. Negative for neck pain.  Skin: Negative for rash and wound.  Neurological: Positive for weakness. Negative for syncope, numbness and headaches.  Psychiatric/Behavioral: Negative for agitation and confusion.  All other systems reviewed and are negative.    Physical Exam Updated Vital Signs BP (!) 157/72   Pulse 66   Temp 98.7 F (37.1 C) (Oral)   Resp 18   LMP 10/06/2016   SpO2 98%   Physical Exam Vitals signs and nursing note reviewed.  Constitutional:      General: She is not in acute distress.    Appearance: Normal appearance. She is well-developed. She is not ill-appearing or toxic-appearing.  HENT:     Head: Normocephalic and atraumatic.     Comments: No obvious scalp trauma.  No intraoral trauma.    Nose: Nose normal.     Mouth/Throat:     Mouth: Mucous membranes are moist.     Pharynx: No oropharyngeal exudate.  Eyes:     Extraocular Movements: Extraocular movements intact.     Conjunctiva/sclera: Conjunctivae normal.     Pupils: Pupils are equal, round, and reactive to light.  Neck:     Musculoskeletal: Normal range of motion and neck supple. No neck rigidity or muscular tenderness.     Comments: No posterior midline cervical tenderness to palpation. Cardiovascular:     Rate and Rhythm: Normal rate and regular rhythm.     Heart sounds: No murmur. No friction rub. No gallop.   Pulmonary:     Effort: Pulmonary effort is normal. No respiratory distress.     Breath sounds: Normal breath sounds. No stridor. No wheezing, rhonchi or rales.  Abdominal:     General: Bowel sounds are normal.     Palpations: Abdomen is soft.     Tenderness: There is no abdominal tenderness. There is no guarding or rebound.  Musculoskeletal: Normal range of motion.          General: Tenderness present. No swelling, deformity or signs of injury.     Right lower leg: No edema.     Left lower leg: No edema.     Comments: Patient with a what appears to be diffuse right upper extremity discomfort with palpation.  No obvious swelling, erythema, deformity.  Distal pulses intact.  Lymphadenopathy:     Cervical: No cervical adenopathy.  Skin:    General: Skin is warm and dry.     Capillary Refill: Capillary refill takes less than 2 seconds.     Findings: No erythema or rash.  Neurological:     Mental Status: She is alert.     Comments: Patient with expressive  aphasia.  Extensive right-sided paralysis.  4/5 motor in left upper and left lower extremities.  Sensation to light touch intact.  Psychiatric:        Behavior: Behavior normal.      ED Treatments / Results  Labs (all labs ordered are listed, but only abnormal results are displayed) Labs Reviewed  CBC WITH DIFFERENTIAL/PLATELET - Abnormal; Notable for the following components:      Result Value   RBC 3.86 (*)    Hemoglobin 10.5 (*)    HCT 31.4 (*)    Platelets 92 (*)    All other components within normal limits  COMPREHENSIVE METABOLIC PANEL - Abnormal; Notable for the following components:   Glucose, Bld 130 (*)    Creatinine, Ser 1.02 (*)    Calcium 8.3 (*)    Albumin 3.1 (*)    GFR calc non Af Amer 52 (*)    GFR calc Af Amer 60 (*)    Anion gap 4 (*)    All other components within normal limits  URINALYSIS, ROUTINE W REFLEX MICROSCOPIC    EKG EKG Interpretation  Date/Time:  Thursday April 25 2018 18:08:24 EST Ventricular Rate:  65 PR Interval:    QRS Duration: 88 QT Interval:  562 QTC Calculation: 585 R Axis:   7 Text Interpretation:  Sinus rhythm Low voltage, precordial leads Borderline repolarization abnormality Prolonged QT interval Confirmed by Loren Racer ([id number]) on 04/25/2018 7:14:26 PM   Radiology Dg Forearm Right  Result Date: 04/25/2018 CLINICAL DATA:   Recent fall with forearm pain, initial encounter EXAM: RIGHT FOREARM - 2 VIEW COMPARISON:  None. FINDINGS: No acute fracture or dislocation is noted. Mild degenerative changes are noted about the carpal bones. No other focal abnormality is seen. IMPRESSION: No acute abnormality noted. Electronically Signed   By: Alcide Clever M.D.   On: 04/25/2018 19:19   Ct Head Wo Contrast  Result Date: 04/25/2018 CLINICAL DATA:  Head trauma. EXAM: CT HEAD WITHOUT CONTRAST CT CERVICAL SPINE WITHOUT CONTRAST TECHNIQUE: Multidetector CT imaging of the head and cervical spine was performed following the standard protocol without intravenous contrast. Multiplanar CT image reconstructions of the cervical spine were also generated. COMPARISON:  02/10/2014 FINDINGS: CT HEAD FINDINGS Brain: Old infarct and encephalomalacia within the posterior left frontal and parietal lobe, stable. No acute intracranial abnormality. Specifically, no hemorrhage, hydrocephalus, mass lesion, acute infarction, or significant intracranial injury. Vascular: No hyperdense vessel or unexpected calcification. Skull: No acute calvarial abnormality. Sinuses/Orbits: Visualized paranasal sinuses and mastoids clear. Orbital soft tissues unremarkable. Other: None CT CERVICAL SPINE FINDINGS Alignment: No subluxation Skull base and vertebrae: No acute fracture. No primary bone lesion or focal pathologic process. Soft tissues and spinal canal: No prevertebral fluid or swelling. No visible canal hematoma. Disc levels:  Diffuse degenerative disc and facet disease. Upper chest: No acute findings Other: Carotid artery calcifications. IMPRESSION: Old left MCA infarct with encephalomalacia, stable since 2015. No acute intracranial abnormality. No acute bony abnormality in the cervical spine.  Spondylosis. Electronically Signed   By: Charlett Nose M.D.   On: 04/25/2018 19:02   Ct Cervical Spine Wo Contrast  Result Date: 04/25/2018 CLINICAL DATA:  Head trauma. EXAM: CT  HEAD WITHOUT CONTRAST CT CERVICAL SPINE WITHOUT CONTRAST TECHNIQUE: Multidetector CT imaging of the head and cervical spine was performed following the standard protocol without intravenous contrast. Multiplanar CT image reconstructions of the cervical spine were also generated. COMPARISON:  02/10/2014 FINDINGS: CT HEAD FINDINGS Brain: Old infarct and encephalomalacia within the posterior  left frontal and parietal lobe, stable. No acute intracranial abnormality. Specifically, no hemorrhage, hydrocephalus, mass lesion, acute infarction, or significant intracranial injury. Vascular: No hyperdense vessel or unexpected calcification. Skull: No acute calvarial abnormality. Sinuses/Orbits: Visualized paranasal sinuses and mastoids clear. Orbital soft tissues unremarkable. Other: None CT CERVICAL SPINE FINDINGS Alignment: No subluxation Skull base and vertebrae: No acute fracture. No primary bone lesion or focal pathologic process. Soft tissues and spinal canal: No prevertebral fluid or swelling. No visible canal hematoma. Disc levels:  Diffuse degenerative disc and facet disease. Upper chest: No acute findings Other: Carotid artery calcifications. IMPRESSION: Old left MCA infarct with encephalomalacia, stable since 2015. No acute intracranial abnormality. No acute bony abnormality in the cervical spine.  Spondylosis. Electronically Signed   By: Charlett NoseKevin  Dover M.D.   On: 04/25/2018 19:02   Dg Humerus Right  Result Date: 04/25/2018 CLINICAL DATA:  Recent fall with arm pain, initial encounter EXAM: RIGHT HUMERUS - 2+ VIEW COMPARISON:  None. FINDINGS: There is a transverse fracture identified in the proximal right humerus involving the surgical neck. No dislocation is seen. The more distal humerus is unremarkable. No abnormality of the bony thorax is seen. IMPRESSION: Transverse fracture through the proximal right humerus. Electronically Signed   By: Alcide CleverMark  Lukens M.D.   On: 04/25/2018 19:18    Procedures Procedures  (including critical care time)  Medications Ordered in ED Medications - No data to display   Initial Impression / Assessment and Plan / ED Course  I have reviewed the triage vital signs and the nursing notes.  Pertinent labs & imaging results that were available during my care of the patient were reviewed by me and considered in my medical decision making (see chart for details).     Patient with evidence of right proximal humerus fracture.  CT head without acute findings.  Placed in sling and will need to follow-up with orthopedics as outpatient.  Family asked to have home health evaluation for physical therapy needs.  Return precautions given.  Final Clinical Impressions(s) / ED Diagnoses   Final diagnoses:  Closed fracture of proximal end of right humerus, unspecified fracture morphology, initial encounter    ED Discharge Orders         Ordered    Home Health     04/25/18 2252    Face-to-face encounter (required for Medicare/Medicaid patients)    Comments:  I Loren Raceravid [place] certify that this patient is under my care and that I, or a nurse practitioner or physician's assistant working with me, had a face-to-face encounter that meets the physician face-to-face encounter requirements with this patient on 04/25/2018. The encounter with the patient was in whole, or in part for the following medical condition(s) which is the primary reason for home health care (List medical condition): Right-sided stroke, right proximal humerus fracture   04/25/18 2252           Loren RacerYelverton, Mckynzie Liwanag, MD 04/25/18 2254

## 2018-04-25 NOTE — ED Notes (Signed)
Called [facility] for patient  

## 2018-04-30 ENCOUNTER — Other Ambulatory Visit: Payer: Self-pay | Admitting: Physician Assistant

## 2018-04-30 NOTE — Telephone Encounter (Signed)
Requested Prescriptions  Pending Prescriptions Disp Refills  . gabapentin (NEURONTIN) 100 MG capsule [Pharmacy Med Name: GABAPENTIN 100 MG CAPSULE] 60 capsule 0    Sig: TAKE 1 CAPSULE BY MOUTH 2 TIMES DAILY     Neurology: Anticonvulsants - gabapentin Passed - 04/30/2018 12:13 PM      Passed - Valid encounter within last 12 months    Recent Outpatient Visits          2 weeks ago Paresthesia and pain of right extremity   [facility], Manus RuddZoe A, MD   5 months ago Essential hypertension   [facility], Myrle ShengKristi M, MD   1 year ago Essential hypertension   [facility] at [facility], Myrle ShengKristi M, MD   1 year ago Essential hypertension   [facility], Myrle ShengKristi M, MD   1 year ago Essential hypertension   [facility], Myrle ShengKristi M, MD      Future Appointments            In 3 weeks Doristine BosworthStallings, Zoe A, MD [place], [facility]   In 6 months Doristine BosworthStallings, Zoe A, MD [place], [facility]

## 2018-05-27 ENCOUNTER — Ambulatory Visit: Payer: Medicare Other | Admitting: Family Medicine

## 2018-05-31 ENCOUNTER — Other Ambulatory Visit: Payer: Self-pay | Admitting: Family Medicine

## 2018-05-31 DIAGNOSIS — R202 Paresthesia of skin: Principal | ICD-10-CM

## 2018-05-31 DIAGNOSIS — M79609 Pain in unspecified limb: Secondary | ICD-10-CM

## 2018-05-31 NOTE — Telephone Encounter (Signed)
Requested medication (s) are due for refill today: yes  Requested medication (s) are on the active medication list: yes  Last refill:  04/15/18  #60 0 refills  Future visit scheduled: yes  Notes to clinic:  Not delegated    Requested Prescriptions  Pending Prescriptions Disp Refills   HYDROcodone-acetaminophen (NORCO/VICODIN) 5-325 MG tablet [Pharmacy Med Name: HYDROCODONE-ACETAMINOPHEN 5 MG-325MG  TABLET] 60 tablet     Sig: TAKE 2 TABLETS BY MOUTH 2 TIMES DAILY     Not Delegated - Analgesics:  Opioid Agonist Combinations Failed - 05/31/2018 12:35 PM      Failed - This refill cannot be delegated      Failed - Urine Drug Screen completed in last 360 days.      Passed - Valid encounter within last 6 months    Recent Outpatient Visits          1 month ago Paresthesia and pain of right extremity   Primary Care at [facility], Manus Rudd, MD   6 months ago Essential hypertension   Primary Care at [facility], Myrle Sheng, MD   1 year ago Essential hypertension   Primary Care at [facility], Myrle Sheng, MD   1 year ago Essential hypertension   Primary Care at [facility], Myrle Sheng, MD   2 years ago Essential hypertension   Primary Care at [facility], Myrle Sheng, MD      Future Appointments            In 5 months Doristine Bosworth, MD Primary Care at [place], [facility]

## 2018-07-02 ENCOUNTER — Other Ambulatory Visit: Payer: Self-pay | Admitting: Family Medicine

## 2018-07-02 DIAGNOSIS — M79609 Pain in unspecified limb: Secondary | ICD-10-CM

## 2018-07-02 DIAGNOSIS — R202 Paresthesia of skin: Principal | ICD-10-CM

## 2018-07-02 NOTE — Telephone Encounter (Signed)
Patient is requesting a refill of the following medications: Requested Prescriptions   Pending Prescriptions Disp Refills  . HYDROcodone-acetaminophen (NORCO/VICODIN) 5-325 MG tablet [Pharmacy Med Name: hydrocodone 5 mg-acetaminophen 325 mg tablet] 60 tablet 0    Sig: TAKE 2 TABLETS BY MOUTH 2 TIMES DAILY   Signed Prescriptions Disp Refills  . gabapentin (NEURONTIN) 100 MG capsule 180 capsule 0    Sig: TAKE 1 CAPSULE BY MOUTH 2 TIMES DAILY    Authorizing Provider: Collie Siad A    Ordering User: Amado Coe    Date of patient request: 07/02/2018 Last office visit: 04/16/2019 Date of last refill: 06/03/2018 Last refill amount: 60 tad Follow up time period per chart: 10/28/2018

## 2018-07-02 NOTE — Telephone Encounter (Signed)
Requested medication (s) are due for refill today: yes  Requested medication (s) are on the active medication list: yes  Last refill:  06/03/18 #60  Future visit scheduled: yes, 10/28/18  Notes to clinic:  LOV: 04/15/18    Requested Prescriptions  Pending Prescriptions Disp Refills   HYDROcodone-acetaminophen (NORCO/VICODIN) 5-325 MG tablet [Pharmacy Med Name: hydrocodone 5 mg-acetaminophen 325 mg tablet] 60 tablet 0    Sig: TAKE 2 TABLETS BY MOUTH 2 TIMES DAILY     Not Delegated - Analgesics:  Opioid Agonist Combinations Failed - 07/02/2018  3:54 PM      Failed - This refill cannot be delegated      Failed - Urine Drug Screen completed in last 360 days.      Passed - Valid encounter within last 6 months    Recent Outpatient Visits          2 months ago Paresthesia and pain of right extremity   [facility], Manus Rudd, MD   7 months ago Essential hypertension   Primary Care at [facility], Myrle Sheng, MD   1 year ago Essential hypertension   Primary Care at [facility], Myrle Sheng, MD   1 year ago Essential hypertension   Primary Care at [facility], Myrle Sheng, MD   2 years ago Essential hypertension   Primary Care at [facility], Myrle Sheng, MD      Future Appointments            In 3 months Doristine Bosworth, MD Primary Care at [place], [facility]         Signed Prescriptions Disp Refills   gabapentin (NEURONTIN) 100 MG capsule 180 capsule 0    Sig: TAKE 1 CAPSULE BY MOUTH 2 TIMES DAILY     Neurology: Anticonvulsants - gabapentin Passed - 07/02/2018  3:54 PM      Passed - Valid encounter within last 12 months    Recent Outpatient Visits          2 months ago Paresthesia and pain of right extremity   Primary Care at [facility], Manus Rudd, MD   7 months ago Essential hypertension   Primary Care at [facility], Myrle Sheng, MD   1 year ago Essential hypertension   Primary Care at [facility], Myrle Sheng, MD   1 year ago Essential hypertension   Primary Care at  [facility], Myrle Sheng, MD   2 years ago Essential hypertension   Primary Care at [facility], Myrle Sheng, MD      Future Appointments            In 3 months Doristine Bosworth, MD Primary Care at [place], [facility]

## 2018-10-02 ENCOUNTER — Other Ambulatory Visit: Payer: Self-pay | Admitting: Family Medicine

## 2018-10-02 DIAGNOSIS — M79609 Pain in unspecified limb: Secondary | ICD-10-CM

## 2018-10-02 NOTE — Telephone Encounter (Signed)
Please advise on med refill. Controlled.  Last filled 07/05/18 #60 with 0 refills  Last seen 04/15/18 F/u APPT. 10/28/18

## 2018-10-23 ENCOUNTER — Other Ambulatory Visit: Payer: Self-pay | Admitting: Family Medicine

## 2018-10-23 ENCOUNTER — Telehealth: Payer: Self-pay | Admitting: Family Medicine

## 2018-10-23 DIAGNOSIS — R202 Paresthesia of skin: Secondary | ICD-10-CM

## 2018-10-23 DIAGNOSIS — M79609 Pain in unspecified limb: Secondary | ICD-10-CM

## 2018-10-23 NOTE — Telephone Encounter (Signed)
Copied from [place] [phone]. Topic: General - Other >> Oct 23, 2018  2:13 PM Jodie Echevaria wrote: Reason for CRM: [facility] - [place], [place] - [address] [phone] (Phone) [phone] (Fax)  Called to clarify Rx sent over for amitriptyline (ELAVIL) 10 MG tablet need to know if it is only for 10 tablets as indicated or was it a typo need a call back or new Rx if it should be 30 tablets.

## 2018-10-24 ENCOUNTER — Other Ambulatory Visit: Payer: Self-pay | Admitting: Family Medicine

## 2018-10-24 DIAGNOSIS — R202 Paresthesia of skin: Secondary | ICD-10-CM

## 2018-10-24 DIAGNOSIS — M79609 Pain in unspecified limb: Secondary | ICD-10-CM

## 2018-10-24 NOTE — Telephone Encounter (Signed)
Only #10 tablets were called in, note from pharmacy is asking can #30 be sent. Please advise

## 2018-10-26 ENCOUNTER — Other Ambulatory Visit: Payer: Self-pay | Admitting: Family Medicine

## 2018-10-26 DIAGNOSIS — M79609 Pain in unspecified limb: Secondary | ICD-10-CM

## 2018-10-28 ENCOUNTER — Ambulatory Visit: Payer: Medicare Other | Admitting: Family Medicine

## 2018-10-29 ENCOUNTER — Other Ambulatory Visit: Payer: Self-pay

## 2018-10-29 ENCOUNTER — Telehealth: Payer: Self-pay | Admitting: Family Medicine

## 2018-10-29 DIAGNOSIS — I672 Cerebral atherosclerosis: Secondary | ICD-10-CM

## 2018-10-29 MED ORDER — CARVEDILOL 6.25 MG PO TABS: 6.2500 mg | ORAL_TABLET | Freq: Two times a day (BID) | ORAL | 0 refills | Status: DC

## 2018-10-29 NOTE — Telephone Encounter (Signed)
Joy called from the pharmacy and stated that the carvedilol (COREG) 6.25 MG tablet [[id number]] was only called in for 15 days, she wanted to know if this was correct   [phone] number

## 2018-10-31 MED ORDER — AMITRIPTYLINE HCL 10 MG PO TABS: 10.0000 mg | ORAL_TABLET | Freq: Every day | ORAL | 3 refills | Status: DC

## 2018-10-31 NOTE — Telephone Encounter (Signed)
Sent in the corrected quantity of amitriptyline

## 2018-10-31 NOTE — Addendum Note (Signed)
Addended by: Delia Chimes A on: 10/31/2018 01:44 PM   Modules accepted: Orders

## 2018-10-31 NOTE — Telephone Encounter (Signed)
Is additional medication needed

## 2018-11-18 ENCOUNTER — Encounter: Payer: Self-pay | Admitting: Family Medicine

## 2018-11-18 ENCOUNTER — Ambulatory Visit (INDEPENDENT_AMBULATORY_CARE_PROVIDER_SITE_OTHER): Payer: Medicare Other | Admitting: Family Medicine

## 2018-11-18 ENCOUNTER — Other Ambulatory Visit: Payer: Self-pay

## 2018-11-18 VITALS — BP 146/78 | HR 71 | Temp 98.4°F | Resp 17 | Wt 224.0 lb

## 2018-11-18 DIAGNOSIS — Z0000 Encounter for general adult medical examination without abnormal findings: Secondary | ICD-10-CM

## 2018-11-18 DIAGNOSIS — R7303 Prediabetes: Secondary | ICD-10-CM

## 2018-11-18 DIAGNOSIS — L603 Nail dystrophy: Secondary | ICD-10-CM

## 2018-11-18 DIAGNOSIS — I10 Essential (primary) hypertension: Secondary | ICD-10-CM | POA: Diagnosis not present

## 2018-11-18 DIAGNOSIS — R202 Paresthesia of skin: Secondary | ICD-10-CM

## 2018-11-18 DIAGNOSIS — E034 Atrophy of thyroid (acquired): Secondary | ICD-10-CM | POA: Diagnosis not present

## 2018-11-18 DIAGNOSIS — D508 Other iron deficiency anemias: Secondary | ICD-10-CM

## 2018-11-18 DIAGNOSIS — E7439 Other disorders of intestinal carbohydrate absorption: Secondary | ICD-10-CM | POA: Diagnosis not present

## 2018-11-18 DIAGNOSIS — Z8673 Personal history of transient ischemic attack (TIA), and cerebral infarction without residual deficits: Secondary | ICD-10-CM

## 2018-11-18 DIAGNOSIS — E875 Hyperkalemia: Secondary | ICD-10-CM | POA: Diagnosis not present

## 2018-11-18 DIAGNOSIS — I6930 Unspecified sequelae of cerebral infarction: Secondary | ICD-10-CM | POA: Diagnosis not present

## 2018-11-18 DIAGNOSIS — M79609 Pain in unspecified limb: Secondary | ICD-10-CM

## 2018-11-18 DIAGNOSIS — Z0001 Encounter for general adult medical examination with abnormal findings: Secondary | ICD-10-CM

## 2018-11-18 DIAGNOSIS — E2839 Other primary ovarian failure: Secondary | ICD-10-CM

## 2018-11-18 DIAGNOSIS — I672 Cerebral atherosclerosis: Secondary | ICD-10-CM

## 2018-11-18 MED ORDER — ATORVASTATIN CALCIUM 80 MG PO TABS: 80.0000 mg | ORAL_TABLET | Freq: Every day | ORAL | 3 refills | Status: DC

## 2018-11-18 MED ORDER — AMLODIPINE BESYLATE 10 MG PO TABS: 10.0000 mg | ORAL_TABLET | Freq: Every day | ORAL | 3 refills | Status: DC

## 2018-11-18 MED ORDER — CLOPIDOGREL BISULFATE 75 MG PO TABS: 75.0000 mg | ORAL_TABLET | Freq: Every day | ORAL | 3 refills | Status: DC

## 2018-11-18 MED ORDER — HYDROCODONE-ACETAMINOPHEN 5-325 MG PO TABS: 2.0000 | ORAL_TABLET | Freq: Two times a day (BID) | ORAL | 0 refills | Status: DC

## 2018-11-18 MED ORDER — LEVETIRACETAM 500 MG PO TABS: 500.0000 mg | ORAL_TABLET | Freq: Two times a day (BID) | ORAL | 3 refills | Status: DC

## 2018-11-18 MED ORDER — CARVEDILOL 6.25 MG PO TABS: 6.2500 mg | ORAL_TABLET | Freq: Two times a day (BID) | ORAL | 3 refills | Status: DC

## 2018-11-18 MED ORDER — AMITRIPTYLINE HCL 10 MG PO TABS: 10.0000 mg | ORAL_TABLET | Freq: Every day | ORAL | 3 refills | Status: DC

## 2018-11-18 MED ORDER — FAMOTIDINE 20 MG PO TABS: 20.0000 mg | ORAL_TABLET | Freq: Two times a day (BID) | ORAL | 11 refills | Status: DC

## 2018-11-18 NOTE — Progress Notes (Signed)
QUICK REFERENCE INFORMATION: The ABCs of Providing the Annual Wellness Visit  CMS.gov Medicare Learning Network  [employer] Annual Wellness Visit  Subjective:   Carla Manning is a 84 y.o. Female who presents for an Annual Wellness Visit.   Prediabetes : Patient presents for follow up of prediabetes. Patient is diet controlled. She sticks to a diabetic diet. She does not eat a lot of food.She does not have symptoms of hypoglycemia.  She is not checking her blood glucose.  Lab Results  Component Value Date   HGBA1C 5.5 11/18/2018   Hypertension: Patient here for follow-up of elevated blood pressure. She is not exercising and is adherent to low salt diet.  Blood pressure is well controlled at home. Cardiac symptoms none. Patient denies chest pain, chest pressure/discomfort, dyspnea, fatigue and lower extremity edema.  Cardiovascular risk factors: advanced age (older than 38 for men, 67 for women), dyslipidemia, hypertension and sedentary lifestyle. Use of agents associated with hypertension: none. History of target organ damage: stroke. BP Readings from Last 3 Encounters:  11/18/18 (!) 146/78  04/25/18 (!) 150/82  04/15/18 123/76     Patient Active Problem List   Diagnosis Date Noted  . History of CVA with residual deficit 07/20/2015  . Vitamin B6 deficiency neuropathy 06/25/2014  . Occipital neuralgia 06/25/2014  . Essential hypertension 06/25/2014  . HLD (hyperlipidemia) 06/25/2014  . Paresthesia and pain of right extremity 04/09/2013  . Hypothyroidism 08/31/2012  . Partial seizure ([place]) 08/29/2012  . GERD (gastroesophageal reflux disease) 05/30/2011  . Cerebrovascular disease, arteriosclerotic, post-stroke 05/13/2010    Past Medical History:  Diagnosis Date  . Anxiety   . Depression   . GERD (gastroesophageal reflux disease)   . Hyperlipidemia   . Hypertension   . Hypothyroid   . Seizures ([place])    post CVA  . Stroke [facility])    R sided weakness, aphasia     Past  Surgical History:  Procedure Laterality Date  . NO PAST SURGERIES       Outpatient Medications Prior to Visit  Medication Sig Dispense Refill  . amitriptyline (ELAVIL) 10 MG tablet Take 1 tablet (10 mg total) by mouth at bedtime. 90 tablet 3  . amLODipine (NORVASC) 10 MG tablet Take 1 tablet (10 mg total) by mouth daily. 90 tablet 3  . atorvastatin (LIPITOR) 80 MG tablet Take 1 tablet (80 mg total) by mouth daily. (Patient taking differently: Take 80 mg by mouth daily at 6 PM. ) 90 tablet 3  . carvedilol (COREG) 6.25 MG tablet Take 1 tablet (6.25 mg total) by mouth 2 (two) times daily with a meal. 30 tablet 0  . clopidogrel (PLAVIX) 75 MG tablet Take 1 tablet (75 mg total) by mouth daily. 90 tablet 3  . gabapentin (NEURONTIN) 100 MG capsule TAKE 1 CAPSULE BY MOUTH 2 TIMES DAILY 180 capsule 0  . HYDROcodone-acetaminophen (NORCO/VICODIN) 5-325 MG tablet TAKE 2 TABLETS BY MOUTH 2 TIMES DAILY 60 tablet 0  . levETIRAcetam (KEPPRA) 500 MG tablet Take 1 tablet (500 mg total) by mouth every 12 (twelve) hours. 180 tablet 3  . levothyroxine (SYNTHROID, LEVOTHROID) 25 MCG tablet TAKE 1 TABLET BY MOUTH EVERY MORNING BEFORE breakfast (Patient taking differently: Take 25 mcg by mouth daily before breakfast. ) 90 tablet 3  . ranitidine (ZANTAC) 150 MG capsule Take 1 capsule (150 mg total) by mouth daily. 90 capsule 3   No facility-administered medications prior to visit.     No Known Allergies   Family History  Problem Relation  Age of Onset  . Diabetes Mother   . Hypertension Daughter   . Hypertension Daughter   . Diabetes Daughter      Social History   Socioeconomic History  . Marital status: Divorced    Spouse name: Not on file  . Number of children: 12  . Years of education: 6  . Highest education level: 6th grade  Occupational History  . Occupation: Retired    Fish farm manager: NOT EMPLOYED  Social Needs  . Financial resource strain: Not hard at all  . Food insecurity    Worry: Never  true    Inability: Never true  . Transportation needs    Medical: No    Non-medical: No  Tobacco Use  . Smoking status: Never Smoker  . Smokeless tobacco: Never Used  Substance and Sexual Activity  . Alcohol use: No    Alcohol/week: 0.0 standard drinks  . Drug use: No  . Sexual activity: Never    Birth control/protection: Post-menopausal  Lifestyle  . Physical activity    Days per week: 0 days    Minutes per session: 0 min  . Stress: Very much  Relationships  . Social connections    Talks on phone: More than three times a week    Gets together: More than three times a week    Attends religious service: More than 4 times per year    Active member of club or organization: No    Attends meetings of clubs or organizations: Never    Relationship status: Divorced  Other Topics Concern  . Not on file  Social History Narrative   Marital status: single/divorced.        Children: 12 children; 52 grandchildren; 23 gg.      Lives: with son       Employment: retired/disability.  Scientist, product/process development.         Tobacco: none      Alcohol: none      ADLs: wheelchair bound; no incontinence; walks to bathroom and down the steps to the car; bathing self; needs some assistance with dressing; can brush teeth herself;no cooking; no driving.  No finances; no grocery shopping.  Caregiver all the time (sons, daughter)      Advanced Directives:  HCPOA [name] [phone]);  FULL CODE.        Patient is now left handed.   Patient has 6 th grade education.   Patient drinks 1 1/2 cup daily.      Recent Hospitalizations? No  Health Habits: Current exercise activities include: none Exercise: 0 times/week. Diet: in general, a "healthy" diet    Alcohol intake: none  Health Risk Assessment: The patient has completed a Health Risk Assessment. This has been reveiwed with them and has been scanned into the [place] system as an attached document.  Current Medical Providers and  Suppliers: [facility] Patient Care Team: Forrest Moron, MD as PCP - General (Internal Medicine) Future Appointments  Date Time Provider [place]  05/19/2019 10:00 AM Forrest Moron, MD PCP-PCP [facility]     Age-appropriate Screening Schedule: The list below includes current immunization status and future screening recommendations based on patient's age. Orders for these recommended tests are listed in the plan section. The patient has been provided with a written plan. Immunization History  Administered Date(s) Administered  . Influenza, High Dose Seasonal PF 04/15/2018  . Influenza,inj,Quad PF,6+ Mos 04/08/2013, 03/24/2014, 03/08/2015, 05/23/2016, 03/30/2017  . Pneumococcal Conjugate-13 11/04/2014  . Pneumococcal Polysaccharide-23 11/24/2010, 08/29/2012  Health Maintenance reviewed -  Bone density   Depression Screen-PHQ2/9 completed today  Depression screen [facility] 2/9 11/18/2018 11/18/2018 04/15/2018 11/19/2017 03/30/2017  Decreased Interest 0 0 0 0 0  Down, Depressed, Hopeless 0 0 1 0 0  PHQ - 2 Score 0 0 1 0 0  Altered sleeping - - - - -  Tired, decreased energy - - - - -  Change in appetite - - - - -  Feeling bad or failure about yourself  - - - - -  Trouble concentrating - - - - -  Moving slowly or fidgety/restless - - - - -  Suicidal thoughts - - - - -  PHQ-9 Score - - - - -       Depression Severity and Treatment Recommendations:  0-4= None  5-9= Mild / Treatment: Support, educate to call if worse; return in one month  10-14= Moderate / Treatment: Support, watchful waiting; Antidepressant or Psycotherapy  15-19= Moderately severe / Treatment: Antidepressant OR Psychotherapy  >= 20 = Major depression, severe / Antidepressant AND Psychotherapy  Functional Status Survey:   Is the patient deaf or have difficulty hearing?: No Does the patient have difficulty seeing, even when wearing glasses/contacts?: Yes(daughter will take for eye exam) Does the patient have  difficulty concentrating, remembering, or making decisions?: Yes(limited vocabulary and is has been more forgetful lately per daughter) Does the patient have difficulty walking or climbing stairs?: Yes Does the patient have difficulty dressing or bathing?: Yes Does the patient have difficulty doing errands alone such as visiting a doctor's office or shopping?: Yes    Advanced Care Planning: 1. Patient has executed an Advance Directive: Yes 2. If no, patient was given the opportunity to execute an Advance Directive today? Yes 3. Are the patient's advanced directives in [place]? Yes 4. This patient has the ability to prepare an Advance Directive: Yes 5. Provider is willing to follow the patient's wishes: Yes  Cognitive Assessment: Does the patient have evidence of cognitive impairment? Yes The patient does not have any evidence of any cognitive problems and denies any  change in mood/affect, appearance, speech, memory or motor skills.  Identification of Risk Factors: Risk factors include: stroke  ROS Review of Systems  Constitutional: Negative for activity change, appetite change, chills and fever.  HENT: Negative for congestion, nosebleeds, trouble swallowing and voice change.   Respiratory: Negative for cough, shortness of breath and wheezing.   Gastrointestinal: Negative for diarrhea, nausea and vomiting.  Genitourinary: Negative for difficulty urinating, dysuria, flank pain and hematuria.  Musculoskeletal: Negative for back pain, joint swelling and neck pain.  Neurological: Negative for dizziness, speech difficulty, light-headedness and numbness.  See HPI. All other review of systems negative.     Objective:   Vitals:   11/18/18 1543  BP: (!) 146/78  Pulse: 71  Resp: 17  Temp: 98.4 F (36.9 C)  TempSrc: Oral  SpO2: 100%  Weight: 224 lb (101.6 kg)   Diabetic Foot Exam - Simple   Simple Foot Form Diabetic Foot exam was performed with the following findings: Yes  11/18/2018  3:45 PM  Visual Inspection See comments: Yes Sensation Testing Intact to touch and monofilament testing bilaterally: Yes See comments: Yes Pulse Check Posterior Tibialis and Dorsalis pulse intact bilaterally: Yes See comments: Yes Comments Swelling in right ankle, discoloration to foot and ingrown toenails, sensitivity to touch per pt feels like burning when microfilament is touching toes and bottom of feet(right foot) more than left.  Body mass index is 43.75 kg/m.  Physical Exam Constitutional:      Appearance: Normal appearance. She is normal weight.  HENT:     Head: Normocephalic and atraumatic.  Cardiovascular:     Rate and Rhythm: Normal rate and regular rhythm.     Pulses: Normal pulses.     Heart sounds: No murmur.  Pulmonary:     Effort: Pulmonary effort is normal. No respiratory distress.     Breath sounds: Normal breath sounds. No stridor. No wheezing, rhonchi or rales.  Chest:     Chest wall: No tenderness.  Abdominal:     General: Abdomen is flat. Bowel sounds are normal. There is no distension.     Palpations: Abdomen is soft.     Tenderness: There is no abdominal tenderness.  Neurological:     Mental Status: She is alert.       Assessment/Plan:     In addition to medicare wellness visit, medications were refilled and a diabetic exam performed.   Body mass index is 43.75 kg/m. Discussed the patient's BMI with her.  Carla Manning was seen today for annual exam and medication refill.  Diagnoses and all orders for this visit:  Encounter for Medicare annual wellness exam -  Medicare wellness exam performed Including review of the age appropriate screenings   Glucose intolerance -     HM Diabetes Foot Exam  Prediabetes -     Hemoglobin A1c  Essential hypertension -     CMP14+EGFR -     Lipid panel  History of CVA with residual deficit -     CMP14+EGFR -     Lipid panel  Hypothyroidism due to acquired atrophy of thyroid -      CMP14+EGFR -     TSH -     Lipid panel  Iron deficiency anemia secondary to inadequate dietary iron intake -     CBC  Onychodystrophy -     Ambulatory referral to Podiatry  Estrogen deficiency -     DG Bone Density  Paresthesia and pain of right extremity -     HYDROcodone-acetaminophen (NORCO/VICODIN) 5-325 MG tablet; Take 2 tablets by mouth 2 (two) times daily. -     amitriptyline (ELAVIL) 10 MG tablet; Take 1 tablet (10 mg total) by mouth at bedtime.  Cerebrovascular disease, arteriosclerotic, post-stroke -     carvedilol (COREG) 6.25 MG tablet; Take 1 tablet (6.25 mg total) by mouth 2 (two) times daily with a meal.  Hyperkalemia -     Basic metabolic panel; Future  Other orders -     famotidine (PEPCID) 20 MG tablet; Take 1 tablet (20 mg total) by mouth 2 (two) times daily. -     levETIRAcetam (KEPPRA) 500 MG tablet; Take 1 tablet (500 mg total) by mouth every 12 (twelve) hours. -     amLODipine (NORVASC) 10 MG tablet; Take 1 tablet (10 mg total) by mouth daily. -     atorvastatin (LIPITOR) 80 MG tablet; Take 1 tablet (80 mg total) by mouth daily at 6 PM. -     clopidogrel (PLAVIX) 75 MG tablet; Take 1 tablet (75 mg total) by mouth daily. -     levothyroxine (SYNTHROID) 25 MCG tablet; Take 1 tablet (25 mcg total) by mouth daily before breakfast.      Return in about 6 months (around 05/21/2019) for med check for pain meds.  Future Appointments  Date Time Provider [place]  05/19/2019 10:00 AM Forrest Moron, MD  PCP-PCP [facility]    Patient Instructions       If you have lab work done today you will be contacted with your lab results within the next 2 weeks.  If you have not heard from [place] then please contact us. The fastest way to get your results is to register for My Chart.   IF you received an x-ray today, you will receive an invoice from [facility]. Please contact [facility] at [phone] with questions or concerns regarding your  invoice.   IF you received labwork today, you will receive an invoice from [place]. Please contact [facility] at [phone] with questions or concerns regarding your invoice.   Our billing staff will not be able to assist you with questions regarding bills from these companies.  You will be contacted with the lab results as soon as they are available. The fastest way to get your results is to activate your My Chart account. Instructions are located on the last page of this paperwork. If you have not heard from [place] regarding the results in 2 weeks, please contact this office.       An after visit summary with all of these plans was given to the patient.

## 2018-11-18 NOTE — Patient Instructions (Signed)
° ° ° °  If you have lab work done today you will be contacted with your lab results within the next 2 weeks.  If you have not heard from us then please contact us. The fastest way to get your results is to register for My Chart. ° ° °IF you received an x-ray today, you will receive an invoice from [facility]. Please contact [facility] at [phone] with questions or concerns regarding your invoice.  ° °IF you received labwork today, you will receive an invoice from [facility]. Please contact [facility] at [phone] with questions or concerns regarding your invoice.  ° °Our billing staff will not be able to assist you with questions regarding bills from these companies. ° °You will be contacted with the lab results as soon as they are available. The fastest way to get your results is to activate your My Chart account. Instructions are located on the last page of this paperwork. If you have not heard from us regarding the results in 2 weeks, please contact this office. °  ° ° ° °

## 2018-11-19 LAB — CBC
Hematocrit: 40.8 % (ref 34.0–46.6)
Hemoglobin: 13.6 g/dL (ref 11.1–15.9)
MCH: 28.4 pg (ref 26.6–33.0)
MCHC: 33.3 g/dL (ref 31.5–35.7)
MCV: 85 fL (ref 79–97)
Platelets: 164 10*3/uL (ref 150–450)
RBC: 4.79 x10E6/uL (ref 3.77–5.28)
RDW: 15.5 % — ABNORMAL HIGH (ref 11.7–15.4)
WBC: 4.4 10*3/uL (ref 3.4–10.8)

## 2018-11-19 LAB — LIPID PANEL
Chol/HDL Ratio: 2.2 ratio (ref 0.0–4.4)
Cholesterol, Total: 152 mg/dL (ref 100–199)
HDL: 68 mg/dL (ref >39)
LDL Calculated: 69 mg/dL (ref 0–99)
Triglycerides: 77 mg/dL (ref 0–149)
VLDL Cholesterol Cal: 15 mg/dL (ref 5–40)

## 2018-11-19 LAB — CMP14+EGFR
ALT: 15 IU/L (ref 0–32)
AST: 54 IU/L — ABNORMAL HIGH (ref 0–40)
Albumin/Globulin Ratio: 1 — ABNORMAL LOW (ref 1.2–2.2)
Albumin: 4.6 g/dL (ref 3.6–4.6)
Alkaline Phosphatase: 222 IU/L — ABNORMAL HIGH (ref 39–117)
BUN/Creatinine Ratio: 11 — ABNORMAL LOW (ref 12–28)
BUN: 11 mg/dL (ref 8–27)
Bilirubin Total: 0.3 mg/dL (ref 0.0–1.2)
CO2: 18 mmol/L — ABNORMAL LOW (ref 20–29)
Calcium: 9 mg/dL (ref 8.7–10.3)
Chloride: 101 mmol/L (ref 96–106)
Creatinine, Ser: 1.04 mg/dL — ABNORMAL HIGH (ref 0.57–1.00)
GFR calc Af Amer: 58 mL/min/{1.73_m2} — ABNORMAL LOW (ref >59)
GFR calc non Af Amer: 50 mL/min/{1.73_m2} — ABNORMAL LOW (ref >59)
Globulin, Total: 4.7 g/dL — ABNORMAL HIGH (ref 1.5–4.5)
Glucose: 142 mg/dL — ABNORMAL HIGH (ref 65–99)
Potassium: 6.1 mmol/L — ABNORMAL HIGH (ref 3.5–5.2)
Sodium: 140 mmol/L (ref 134–144)
Total Protein: 9.3 g/dL — ABNORMAL HIGH (ref 6.0–8.5)

## 2018-11-19 LAB — HEMOGLOBIN A1C
Est. average glucose Bld gHb Est-mCnc: 111 mg/dL
Hgb A1c MFr Bld: 5.5 % (ref 4.8–5.6)

## 2018-11-19 LAB — TSH: TSH: 2.37 u[IU]/mL (ref 0.450–4.500)

## 2018-11-19 MED ORDER — LEVOTHYROXINE SODIUM 25 MCG PO TABS: 25.0000 ug | ORAL_TABLET | Freq: Every day | ORAL | 3 refills | Status: DC

## 2018-11-29 ENCOUNTER — Other Ambulatory Visit: Payer: Self-pay

## 2018-12-09 ENCOUNTER — Ambulatory Visit: Payer: Self-pay

## 2018-12-16 ENCOUNTER — Telehealth: Payer: Medicare Other | Admitting: Family Medicine

## 2018-12-23 ENCOUNTER — Ambulatory Visit (INDEPENDENT_AMBULATORY_CARE_PROVIDER_SITE_OTHER): Payer: Medicare Other | Admitting: Family Medicine

## 2018-12-23 ENCOUNTER — Other Ambulatory Visit: Payer: Self-pay

## 2018-12-23 DIAGNOSIS — Z23 Encounter for immunization: Secondary | ICD-10-CM | POA: Diagnosis not present

## 2018-12-23 DIAGNOSIS — E875 Hyperkalemia: Secondary | ICD-10-CM

## 2018-12-23 LAB — BASIC METABOLIC PANEL
BUN/Creatinine Ratio: 10 — ABNORMAL LOW (ref 12–28)
BUN: 12 mg/dL (ref 8–27)
CO2: 22 mmol/L (ref 20–29)
Calcium: 8.8 mg/dL (ref 8.7–10.3)
Chloride: 102 mmol/L (ref 96–106)
Creatinine, Ser: 1.17 mg/dL — ABNORMAL HIGH (ref 0.57–1.00)
GFR calc Af Amer: 50 mL/min/{1.73_m2} — ABNORMAL LOW (ref >59)
GFR calc non Af Amer: 43 mL/min/{1.73_m2} — ABNORMAL LOW (ref >59)
Glucose: 137 mg/dL — ABNORMAL HIGH (ref 65–99)
Potassium: 3.8 mmol/L (ref 3.5–5.2)
Sodium: 141 mmol/L (ref 134–144)

## 2018-12-23 NOTE — Addendum Note (Signed)
Addended by: Kittie Plater, Samaria Anes HUA on: 12/23/2018 11:00 AM   Modules accepted: Orders

## 2019-01-20 ENCOUNTER — Other Ambulatory Visit: Payer: Self-pay | Admitting: Family Medicine

## 2019-01-20 NOTE — Telephone Encounter (Signed)
Requested medication (s) are due for refill today: yes   Requested medication (s) are on the active medication list: yes  Last refill: 12/21/2018  Future visit scheduled: yes  Notes to clinic:  Review for refill   Requested Prescriptions  Pending Prescriptions Disp Refills   gabapentin (NEURONTIN) 100 MG capsule [Pharmacy Med Name: gabapentin 100 mg capsule] 180 capsule 0    Sig: TAKE 1 CAPSULE BY MOUTH 2 TIMES DAILY     Neurology: Anticonvulsants - gabapentin Passed - 01/20/2019  9:44 AM      Passed - Valid encounter within last 12 months    Recent Outpatient Visits          4 weeks ago Need for influenza vaccination   Primary Care at [facility], Lilia Argue, MD   2 months ago Encounter for Medicare annual wellness exam   Primary Care at [facility], [place] A, MD   9 months ago Paresthesia and pain of right extremity   Primary Care at [facility], Arlie Solomons, MD   1 year ago Essential hypertension   Primary Care at [facility], Renette Butters, MD   1 year ago Essential hypertension   Primary Care at [facility], Renette Butters, MD      Future Appointments            In 3 months Forrest Moron, MD Primary Care at [place], [facility]

## 2019-01-23 ENCOUNTER — Other Ambulatory Visit: Payer: Self-pay | Admitting: Family Medicine

## 2019-01-23 DIAGNOSIS — M79609 Pain in unspecified limb: Secondary | ICD-10-CM

## 2019-01-23 NOTE — Telephone Encounter (Signed)
Pt requesting meds.

## 2019-01-23 NOTE — Telephone Encounter (Signed)
Requested medication (s) are due for refill today: yes  Requested medication (s) are on the active medication list: yes  Last refill:  12/23/2018  Future visit scheduled: yes  Notes to clinic: refill cannot be delegated    Requested Prescriptions  Pending Prescriptions Disp Refills   HYDROcodone-acetaminophen (NORCO/VICODIN) 5-325 MG tablet [Pharmacy Med Name: hydrocodone 5 mg-acetaminophen 325 mg tablet] 60 tablet 0    Sig: TAKE 2 TABLETS BY MOUTH 2 TIMES DAILY     Not Delegated - Analgesics:  Opioid Agonist Combinations Failed - 01/23/2019 10:07 AM      Failed - This refill cannot be delegated      Failed - Urine Drug Screen completed in last 360 days.      Passed - Valid encounter within last 6 months    Recent Outpatient Visits          1 month ago Need for influenza vaccination   Primary [facility] at [facility], Lilia Argue, MD   2 months ago Encounter for Medicare annual wellness exam   Primary Care at [facility], [place] A, MD   9 months ago Paresthesia and pain of right extremity   Primary Care at [facility], Arlie Solomons, MD   1 year ago Essential hypertension   Primary Care at [facility], Renette Butters, MD   1 year ago Essential hypertension   Primary Care at [facility], Renette Butters, MD      Future Appointments            In 3 months Forrest Moron, MD Primary Care at [place], [facility]

## 2019-01-31 ENCOUNTER — Telehealth (INDEPENDENT_AMBULATORY_CARE_PROVIDER_SITE_OTHER): Payer: Medicare Other | Admitting: Family Medicine

## 2019-01-31 DIAGNOSIS — Z20822 Contact with and (suspected) exposure to covid-19: Secondary | ICD-10-CM

## 2019-01-31 DIAGNOSIS — Z20828 Contact with and (suspected) exposure to other viral communicable diseases: Secondary | ICD-10-CM

## 2019-01-31 NOTE — Progress Notes (Signed)
CC- Other daughter whom takes care of mother was  tested positive today for covid and would like to have mother tested for covid.

## 2019-01-31 NOTE — Progress Notes (Signed)
Virtual Visit via Telephone Note  I connected with Carla Manning on 01/31/19 at 6:02 PM by telephone and verified that I am speaking with the correct person using two identifiers.   I discussed the limitations, risks, security and privacy concerns of performing an evaluation and management service by telephone and the availability of in person appointments. I also discussed with the patient that there may be a patient responsible charge related to this service. The patient expressed understanding and agreed to proceed, consent obtained  Chief complaint: Exposure to Covid.   History of Present Illness: Carla Manning is a 84 y.o. female   History provided by daughter Carla Manning.   Daughter Carla Manning) who helps to provide care tested positive for Covid. She had been exposed to someone who was positive for Covid. No known symptoms, but had testing as close contact. She found out positive today. Still asx, but she was around her mom past few days. possibitty that she may have been around her mom without a mask.   Carla Manning has not had any signs or symptoms of illness. Not measuring temp, but no fevers, not cough, no short of breath. No reported change in taste or smell.  No cough.   Medical history includes hypothyroidism, hyperlipidemia, history of CVA/cerebrovascular disease, partial seizure, B6 deficiency neuropathy, hypertension.  COVID-19 risk of complications score 6.     Patient Active Problem List   Diagnosis Date Noted  . History of CVA with residual deficit 07/20/2015  . Vitamin B6 deficiency neuropathy 06/25/2014  . Occipital neuralgia 06/25/2014  . Essential hypertension 06/25/2014  . HLD (hyperlipidemia) 06/25/2014  . Paresthesia and pain of right extremity 04/09/2013  . Hypothyroidism 08/31/2012  . Partial seizure (HCC) 08/29/2012  . GERD (gastroesophageal reflux disease) 05/30/2011  . Cerebrovascular disease, arteriosclerotic, post-stroke 05/13/2010   Past Medical  History:  Diagnosis Date  . Anxiety   . Depression   . GERD (gastroesophageal reflux disease)   . Hyperlipidemia   . Hypertension   . Hypothyroid   . Seizures (HCC)    post CVA  . Stroke [facility])    R sided weakness, aphasia   Past Surgical History:  Procedure Laterality Date  . NO PAST SURGERIES     No Known Allergies Prior to Admission medications   Medication Sig Start Date End Date Taking? Authorizing Provider  amitriptyline (ELAVIL) 10 MG tablet Take 1 tablet (10 mg total) by mouth at bedtime. 11/18/18  Yes Stallings, Zoe A, MD  amLODipine (NORVASC) 10 MG tablet Take 1 tablet (10 mg total) by mouth daily. 11/18/18  Yes Doristine Bosworth, MD  atorvastatin (LIPITOR) 80 MG tablet Take 1 tablet (80 mg total) by mouth daily at 6 PM. 11/18/18  Yes Stallings, Zoe A, MD  carvedilol (COREG) 6.25 MG tablet Take 1 tablet (6.25 mg total) by mouth 2 (two) times daily with a meal. 11/18/18  Yes Stallings, Zoe A, MD  clopidogrel (PLAVIX) 75 MG tablet Take 1 tablet (75 mg total) by mouth daily. 11/18/18  Yes Doristine Bosworth, MD  famotidine (PEPCID) 20 MG tablet Take 1 tablet (20 mg total) by mouth 2 (two) times daily. 11/18/18  Yes Stallings, Zoe A, MD  gabapentin (NEURONTIN) 100 MG capsule TAKE 1 CAPSULE BY MOUTH 2 TIMES DAILY 01/20/19  Yes Stallings, Zoe A, MD  HYDROcodone-acetaminophen (NORCO/VICODIN) 5-325 MG tablet TAKE 2 TABLETS BY MOUTH 2 TIMES DAILY 01/23/19  Yes Stallings, Zoe A, MD  levETIRAcetam (KEPPRA) 500 MG tablet Take 1 tablet (500  mg total) by mouth every 12 (twelve) hours. 11/18/18  Yes Forrest Moron, MD  levothyroxine (SYNTHROID) 25 MCG tablet Take 1 tablet (25 mcg total) by mouth daily before breakfast. 11/19/18  Yes Forrest Moron, MD   Social History   Socioeconomic History  . Marital status: Divorced    Spouse name: Not on file  . Number of children: 12  . Years of education: 6  . Highest education level: 6th grade  Occupational History  . Occupation: Retired    Fish farm manager:  NOT EMPLOYED  Social Needs  . Financial resource strain: Not hard at all  . Food insecurity    Worry: Never true    Inability: Never true  . Transportation needs    Medical: No    Non-medical: No  Tobacco Use  . Smoking status: Never Smoker  . Smokeless tobacco: Never Used  Substance and Sexual Activity  . Alcohol use: No    Alcohol/week: 0.0 standard drinks  . Drug use: No  . Sexual activity: Never    Birth control/protection: Post-menopausal  Lifestyle  . Physical activity    Days per week: 0 days    Minutes per session: 0 min  . Stress: Very much  Relationships  . Social connections    Talks on phone: More than three times a week    Gets together: More than three times a week    Attends religious service: More than 4 times per year    Active member of club or organization: No    Attends meetings of clubs or organizations: Never    Relationship status: Divorced  . Intimate partner violence    Fear of current or ex partner: No    Emotionally abused: No    Physically abused: No    Forced sexual activity: No  Other Topics Concern  . Not on file  Social History Narrative   Marital status: single/divorced.        Children: 12 children; 52 grandchildren; 44 gg.      Lives: with son       Employment: retired/disability.  Scientist, product/process development.         Tobacco: none      Alcohol: none      ADLs: wheelchair bound; no incontinence; walks to bathroom and down the steps to the car; bathing self; needs some assistance with dressing; can brush teeth herself;no cooking; no driving.  No finances; no grocery shopping.  Caregiver all the time (sons, daughter)      Advanced Directives:  HCPOA [name] [phone]);  FULL CODE.        Patient is now left handed.   Patient has 6 th grade education.   Patient drinks 1 1/2 cup daily.     Observations/Objective: History provided per daughter.   Assessment and Plan: Exposure to COVID-19 virus - Plan: Novel Coronavirus,  NAA ([facility])  -Possible exposure to daughter who recently tested positive.  Asymptomatic at this time.  -Testing ordered for drive up testing center on Monday.  -Spoke with daughter regarding potential symptoms to watch out for including potential  temperature monitoring, as well as ER/urgent care precautions if confusion, shortness of breath, increasing fevers, or acute worsening symptoms.  Did recommend evaluation by medical provider, potentially initially telemedicine if any new symptoms given her age, COVID risk of complications score.  -Strict handwashing, use of mask recommended for now.  Also recommended for anyone in contact with positive family member to self isolate.  Follow Up  Instructions: As needed.    I discussed the assessment and treatment plan with the patient. The patient was provided an opportunity to ask questions and all were answered. The patient agreed with the plan and demonstrated an understanding of the instructions.   The patient was advised to call back or seek an in-person evaluation if the symptoms worsen or if the condition fails to improve as anticipated.  I provided 17 minutes of non-face-to-face time during this encounter.  Signed,   Meredith StaggersJeffrey Dezerae Freiberger, MD Primary Care at [facility].  01/31/19

## 2019-02-03 ENCOUNTER — Other Ambulatory Visit: Payer: Self-pay | Admitting: *Deleted

## 2019-02-03 DIAGNOSIS — Z20822 Contact with and (suspected) exposure to covid-19: Secondary | ICD-10-CM

## 2019-02-05 LAB — NOVEL CORONAVIRUS, NAA: SARS-CoV-2, NAA: NOT DETECTED

## 2019-03-24 ENCOUNTER — Other Ambulatory Visit: Payer: Self-pay | Admitting: Family Medicine

## 2019-03-24 DIAGNOSIS — M79609 Pain in unspecified limb: Secondary | ICD-10-CM

## 2019-03-24 DIAGNOSIS — R202 Paresthesia of skin: Secondary | ICD-10-CM

## 2019-03-24 NOTE — Telephone Encounter (Signed)
Requested medication (s) are due for refill today: yes  Requested medication (s) are on the active medication list: yes  Last refill:  02/15/2019  Future visit scheduled: yes  Notes to clinic:  Refill cannot be delegated    Requested Prescriptions  Pending Prescriptions Disp Refills   HYDROcodone-acetaminophen (NORCO/VICODIN) 5-325 MG tablet [Pharmacy Med Name: hydrocodone 5 mg-acetaminophen 325 mg tablet] 60 tablet 0    Sig: TAKE 2 TABLETS BY MOUTH 2 TIMES DAILY     Not Delegated - Analgesics:  Opioid Agonist Combinations Failed - 03/24/2019  9:34 AM      Failed - This refill cannot be delegated      Failed - Urine Drug Screen completed in last 360 days.      Passed - Valid encounter within last 6 months    Recent Outpatient Visits          1 month ago Exposure to COVID-19 virus   Primary Care at [facility], Ranell Patrick, MD   3 months ago Need for influenza vaccination   [facility] at [facility], Lilia Argue, MD   4 months ago Encounter for [employer] annual wellness exam   Primary Care at [facility], [place] A, MD   11 months ago Paresthesia and pain of right extremity   Primary Care at [facility], Arlie Solomons, MD   1 year ago Essential hypertension   Primary Care at [facility], Renette Butters, MD      Future Appointments            In 2 months Forrest Moron, MD Primary Care at [place], [facility]

## 2019-03-25 NOTE — Telephone Encounter (Signed)
Patient is requesting a refill of the following medications: Requested Prescriptions   Pending Prescriptions Disp Refills  . HYDROcodone-acetaminophen (NORCO/VICODIN) 5-325 MG tablet [Pharmacy Med Name: hydrocodone 5 mg-acetaminophen 325 mg tablet] 60 tablet 0    Sig: TAKE 2 TABLETS BY MOUTH 2 TIMES DAILY    Date of patient request: 03/24/2019 Last office visit: 11/18/18 Date of last refill:01/23/19 Last refill amount: 60 no refills Follow up time period per chart: n/a

## 2019-04-14 ENCOUNTER — Other Ambulatory Visit: Payer: Self-pay | Admitting: Family Medicine

## 2019-04-14 DIAGNOSIS — I672 Cerebral atherosclerosis: Secondary | ICD-10-CM

## 2019-04-14 DIAGNOSIS — Z8673 Personal history of transient ischemic attack (TIA), and cerebral infarction without residual deficits: Secondary | ICD-10-CM

## 2019-05-15 ENCOUNTER — Other Ambulatory Visit: Payer: Self-pay | Admitting: Family Medicine

## 2019-05-15 DIAGNOSIS — Z8673 Personal history of transient ischemic attack (TIA), and cerebral infarction without residual deficits: Secondary | ICD-10-CM

## 2019-05-15 DIAGNOSIS — I672 Cerebral atherosclerosis: Secondary | ICD-10-CM

## 2019-05-19 ENCOUNTER — Ambulatory Visit: Payer: Medicare Other | Admitting: Family Medicine

## 2019-05-25 NOTE — Progress Notes (Signed)
Telemedicine Encounter- SOAP NOTE Established Patient  This telephone encounter was conducted with the patient's (or proxy's) verbal consent via audio telecommunications: yes/no: Yes Patient was instructed to have this encounter in a suitably private space; and to only have persons present to whom they give permission to participate. In addition, patient identity was confirmed by use of name plus two identifiers (DOB and address).  I discussed the limitations, risks, security and privacy concerns of performing an evaluation and management service by telephone and the availability of in person appointments. I also discussed with the patient that there may be a patient responsible charge related to this service. The patient expressed understanding and agreed to proceed.  I spent a total of TIME; 0 MIN TO 60 MIN: 15 minutes talking with the patient or their proxy.  Chief Complaint  Patient presents with  . medication check and pain medications    6 month recheck. No other concerns for provider today    Subjective   Carla Manning is a 85 y.o. established patient. Telephone visit today for  HPI   Pt needs a refill of her pain medications She is stable on her dose Without opiate induced constipation She takes prune juice as needed She has not needed escalating dose of pain meds Denies any hallucination or depression Her family has been very attentive to her needs   Patient Active Problem List   Diagnosis Date Noted  . History of CVA with residual deficit 07/20/2015  . Vitamin B6 deficiency neuropathy 06/25/2014  . Occipital neuralgia 06/25/2014  . Essential hypertension 06/25/2014  . HLD (hyperlipidemia) 06/25/2014  . Paresthesia and pain of right extremity 04/09/2013  . Hypothyroidism 08/31/2012  . Partial seizure (HCC) 08/29/2012  . GERD (gastroesophageal reflux disease) 05/30/2011  . Cerebrovascular disease, arteriosclerotic, post-stroke 05/13/2010    Past Medical  History:  Diagnosis Date  . Anxiety   . Depression   . GERD (gastroesophageal reflux disease)   . Hyperlipidemia   . Hypertension   . Hypothyroid   . Seizures (HCC)    post CVA  . Stroke [facility])    R sided weakness, aphasia    Current Outpatient Medications  Medication Sig Dispense Refill  . amitriptyline (ELAVIL) 10 MG tablet Take 1 tablet (10 mg total) by mouth at bedtime. 90 tablet 3  . amLODipine (NORVASC) 10 MG tablet Take 1 tablet (10 mg total) by mouth daily. 90 tablet 3  . atorvastatin (LIPITOR) 80 MG tablet Take 1 tablet (80 mg total) by mouth daily at 6 PM. 90 tablet 3  . carvedilol (COREG) 6.25 MG tablet TAKE 1 TABLET BY MOUTH 2 TIMES DAILY WITH FOOD 90 tablet 0  . clopidogrel (PLAVIX) 75 MG tablet Take 1 tablet (75 mg total) by mouth daily. 90 tablet 3  . famotidine (PEPCID) 20 MG tablet Take 1 tablet (20 mg total) by mouth 2 (two) times daily. 60 tablet 11  . gabapentin (NEURONTIN) 100 MG capsule TAKE 1 CAPSULE BY MOUTH 2 TIMES DAILY 180 capsule 3  . HYDROcodone-acetaminophen (NORCO/VICODIN) 5-325 MG tablet Take 2 tablets by mouth 2 (two) times daily. 60 tablet 0  . levETIRAcetam (KEPPRA) 500 MG tablet Take 1 tablet (500 mg total) by mouth every 12 (twelve) hours. 180 tablet 3  . levothyroxine (SYNTHROID) 25 MCG tablet Take 1 tablet (25 mcg total) by mouth daily before breakfast. 90 tablet 3   No current facility-administered medications for this visit.    No Known Allergies  Social History  Socioeconomic History  . Marital status: Divorced    Spouse name: Not on file  . Number of children: 12  . Years of education: 6  . Highest education level: 6th grade  Occupational History  . Occupation: Retired    Fish farm manager: NOT EMPLOYED  Tobacco Use  . Smoking status: Never Smoker  . Smokeless tobacco: Never Used  Substance and Sexual Activity  . Alcohol use: No    Alcohol/week: 0.0 standard drinks  . Drug use: No  . Sexual activity: Never    Birth  control/protection: Post-menopausal  Other Topics Concern  . Not on file  Social History Narrative   Marital status: single/divorced.        Children: 12 children; 52 grandchildren; 41 gg.      Lives: with son       Employment: retired/disability.  Scientist, product/process development.         Tobacco: none      Alcohol: none      ADLs: wheelchair bound; no incontinence; walks to bathroom and down the steps to the car; bathing self; needs some assistance with dressing; can brush teeth herself;no cooking; no driving.  No finances; no grocery shopping.  Caregiver all the time (sons, daughter)      Advanced Directives:  HCPOA [name] [phone]);  FULL CODE.        Patient is now left handed.   Patient has 6 th grade education.   Patient drinks 1 1/2 cup daily.   Social Determinants of Health   Financial Resource Strain:   . Difficulty of Paying Living Expenses: Not on file  Food Insecurity:   . Worried About Charity fundraiser in the Last Year: Not on file  . Ran Out of Food in the Last Year: Not on file  Transportation Needs:   . Lack of Transportation (Medical): Not on file  . Lack of Transportation (Non-Medical): Not on file  Physical Activity:   . Days of Exercise per Week: Not on file  . Minutes of Exercise per Session: Not on file  Stress:   . Feeling of Stress : Not on file  Social Connections:   . Frequency of Communication with Friends and Family: Not on file  . Frequency of Social Gatherings with Friends and Family: Not on file  . Attends Religious Services: Not on file  . Active Member of Clubs or Organizations: Not on file  . Attends Archivist Meetings: Not on file  . Marital Status: Not on file  Intimate Partner Violence:   . Fear of Current or Ex-Partner: Not on file  . Emotionally Abused: Not on file  . Physically Abused: Not on file  . Sexually Abused: Not on file    ROS Review of Systems  Constitutional: Negative for activity change, appetite  change, chills and fever.  HENT: Negative for congestion, nosebleeds, trouble swallowing and voice change.   Respiratory: Negative for cough, shortness of breath and wheezing.   Gastrointestinal: Negative for diarrhea, nausea and vomiting. No issues constipation Genitourinary: Negative for difficulty urinating, dysuria, flank pain and hematuria.  Musculoskeletal: Negative for back pain, joint swelling and neck pain.  Neurological: Negative for dizziness, speech difficulty, light-headedness and numbness.  See HPI. All other review of systems negative.   Objective   Vitals as reported by the patient: There were no vitals filed for this visit.  [place] was seen today for medication check and pain medications.  Diagnoses and all orders for this visit:  Paresthesia and pain of right extremity -     HYDROcodone-acetaminophen (NORCO/VICODIN) 5-325 MG tablet; Take 2 tablets by mouth 2 (two) times daily.   -  Will continue refill, PMP reviewed and is doing well on current dose.      I discussed the assessment and treatment plan with the patient. The patient was provided an opportunity to ask questions and all were answered. The patient agreed with the plan and demonstrated an understanding of the instructions.   The patient was advised to call back or seek an in-person evaluation if the symptoms worsen or if the condition fails to improve as anticipated.  I provided 15 minutes of non-face-to-face time during this encounter.  Doristine Bosworth, MD  Primary Care at [facility]

## 2019-05-26 ENCOUNTER — Telehealth (INDEPENDENT_AMBULATORY_CARE_PROVIDER_SITE_OTHER): Payer: Medicare Other | Admitting: Family Medicine

## 2019-05-26 ENCOUNTER — Other Ambulatory Visit: Payer: Self-pay

## 2019-05-26 DIAGNOSIS — M79609 Pain in unspecified limb: Secondary | ICD-10-CM | POA: Diagnosis not present

## 2019-05-26 DIAGNOSIS — R202 Paresthesia of skin: Secondary | ICD-10-CM | POA: Diagnosis not present

## 2019-05-26 MED ORDER — HYDROCODONE-ACETAMINOPHEN 5-325 MG PO TABS: 2.0000 | ORAL_TABLET | Freq: Two times a day (BID) | ORAL | 0 refills | Status: DC

## 2019-05-26 NOTE — Patient Instructions (Signed)
° ° ° °  If you have lab work done today you will be contacted with your lab results within the next 2 weeks.  If you have not heard from us then please contact us. The fastest way to get your results is to register for My Chart. ° ° °IF you received an x-ray today, you will receive an invoice from [facility]. Please contact [facility] at [phone] with questions or concerns regarding your invoice.  ° °IF you received labwork today, you will receive an invoice from [facility]. Please contact [facility] at [phone] with questions or concerns regarding your invoice.  ° °Our billing staff will not be able to assist you with questions regarding bills from these companies. ° °You will be contacted with the lab results as soon as they are available. The fastest way to get your results is to activate your My Chart account. Instructions are located on the last page of this paperwork. If you have not heard from us regarding the results in 2 weeks, please contact this office. °  ° ° ° °

## 2019-05-26 NOTE — Progress Notes (Signed)
CC: 6 month recheck on medications and refill.  Needs refill on norco 5-325 mg.  No other concerns for provider today.  No travel outside the [place] or [place] in the past 3 weeks.  No recent weight or bp taken per daughter.

## 2019-06-12 ENCOUNTER — Other Ambulatory Visit: Payer: Self-pay | Admitting: Family Medicine

## 2019-06-12 DIAGNOSIS — I672 Cerebral atherosclerosis: Secondary | ICD-10-CM

## 2019-06-25 ENCOUNTER — Other Ambulatory Visit: Payer: Self-pay | Admitting: Family Medicine

## 2019-06-25 DIAGNOSIS — I672 Cerebral atherosclerosis: Secondary | ICD-10-CM

## 2019-07-15 ENCOUNTER — Other Ambulatory Visit: Payer: Self-pay | Admitting: Family Medicine

## 2019-07-15 DIAGNOSIS — I672 Cerebral atherosclerosis: Secondary | ICD-10-CM

## 2019-07-15 NOTE — Telephone Encounter (Signed)
Requested Prescriptions  Pending Prescriptions Disp Refills  . carvedilol (COREG) 6.25 MG tablet [Pharmacy Med Name: carvedilol 6.25 mg tablet] 90 tablet 0    Sig: TAKE 1 TABLET BY MOUTH 2 TIMES DAILY WITH FOOD     Cardiovascular:  Beta Blockers Failed - 07/15/2019 11:09 AM      Failed - Last BP in normal range    BP Readings from Last 1 Encounters:  11/18/18 (!) 146/78         Passed - Last Heart Rate in normal range    Pulse Readings from Last 1 Encounters:  11/18/18 71         Passed - Valid encounter within last 6 months    Recent Outpatient Visits          1 month ago Paresthesia and pain of right extremity   Primary Care at [facility], Manus Rudd, MD   5 months ago Exposure to COVID-19 virus   Primary Care at [facility], Asencion Partridge, MD   6 months ago Need for influenza vaccination   Primary Care at [facility], Meda Coffee, MD   7 months ago Encounter for [employer] annual wellness exam   Primary Care at [facility], Manus Rudd, MD   1 year ago Paresthesia and pain of right extremity   Primary Care at [facility], Manus Rudd, MD

## 2019-07-30 ENCOUNTER — Telehealth: Payer: Self-pay | Admitting: Family Medicine

## 2019-07-30 ENCOUNTER — Telehealth: Payer: Self-pay

## 2019-07-30 DIAGNOSIS — R202 Paresthesia of skin: Secondary | ICD-10-CM

## 2019-07-30 DIAGNOSIS — M79609 Pain in unspecified limb: Secondary | ICD-10-CM

## 2019-07-30 NOTE — Telephone Encounter (Signed)
Patient is requesting a refill of the following medications: Hydro/Apap 5-325 Requested Prescriptions    No prescriptions requested or ordered in this encounter    Date of patient request: 07/30/2019 Last office visit: 12/23/2018 Date of last refill: 05/26/2019 Last refill amount: 60 tablets   Pt has had a telemedicine visit between now and last OV

## 2019-07-30 NOTE — Telephone Encounter (Signed)
Pt daughter called and is wondering if she can get a courtesy refill for her  HYDROcodone-acetaminophen (NORCO/VICODIN) 5-325 MG tablet [[id number]]  Medication until her appt in office on 4/19  Pt will run out on 4/6 Please advise. [phone]

## 2019-07-31 MED ORDER — HYDROCODONE-ACETAMINOPHEN 5-325 MG PO TABS: 2.0000 | ORAL_TABLET | Freq: Two times a day (BID) | ORAL | 0 refills | Status: DC

## 2019-07-31 NOTE — Addendum Note (Signed)
Addended by: Collie Siad A on: 07/31/2019 08:56 AM   Modules accepted: Orders

## 2019-07-31 NOTE — Telephone Encounter (Signed)
Per ROI  advised pain medication sent to pharmacy.

## 2019-07-31 NOTE — Telephone Encounter (Signed)
Please notify patient of refill

## 2019-08-13 ENCOUNTER — Ambulatory Visit: Payer: Medicare Other | Admitting: Family Medicine

## 2019-08-14 ENCOUNTER — Other Ambulatory Visit: Payer: Self-pay | Admitting: Family Medicine

## 2019-08-14 DIAGNOSIS — Z8673 Personal history of transient ischemic attack (TIA), and cerebral infarction without residual deficits: Secondary | ICD-10-CM

## 2019-08-14 DIAGNOSIS — I672 Cerebral atherosclerosis: Secondary | ICD-10-CM

## 2019-08-14 NOTE — Telephone Encounter (Signed)
Requested Prescriptions  Pending Prescriptions Disp Refills  . carvedilol (COREG) 6.25 MG tablet [Pharmacy Med Name: carvedilol 6.25 mg tablet] 180 tablet 0    Sig: TAKE 1 TABLET BY MOUTH 2 TIMES DAILY WITH FOOD     Cardiovascular:  Beta Blockers Failed - 08/14/2019 11:48 AM      Failed - Last BP in normal range    BP Readings from Last 1 Encounters:  11/18/18 (!) 146/78         Passed - Last Heart Rate in normal range    Pulse Readings from Last 1 Encounters:  11/18/18 71         Passed - Valid encounter within last 6 months    Recent Outpatient Visits          2 months ago Paresthesia and pain of right extremity   Primary Care at [facility], Manus Rudd, MD   6 months ago Exposure to COVID-19 virus   Primary Care at [facility], Asencion Partridge, MD   7 months ago Need for influenza vaccination   Primary Care at [facility], Meda Coffee, MD   8 months ago Encounter for [employer] annual wellness exam   Primary Care at [facility], Manus Rudd, MD   1 year ago Paresthesia and pain of right extremity   Primary Care at [facility], Manus Rudd, MD      Future Appointments            In 4 days Doristine Bosworth, MD Primary Care at [place], [facility]

## 2019-08-18 ENCOUNTER — Ambulatory Visit: Payer: Medicare Other | Admitting: Family Medicine

## 2019-09-01 ENCOUNTER — Encounter: Payer: Self-pay | Admitting: Family Medicine

## 2019-09-01 ENCOUNTER — Other Ambulatory Visit: Payer: Self-pay

## 2019-09-01 ENCOUNTER — Ambulatory Visit (INDEPENDENT_AMBULATORY_CARE_PROVIDER_SITE_OTHER): Payer: Medicare Other | Admitting: Family Medicine

## 2019-09-01 VITALS — BP 140/80 | HR 59 | Temp 97.6°F

## 2019-09-01 DIAGNOSIS — M79609 Pain in unspecified limb: Secondary | ICD-10-CM

## 2019-09-01 DIAGNOSIS — Z5181 Encounter for therapeutic drug level monitoring: Secondary | ICD-10-CM

## 2019-09-01 DIAGNOSIS — R7303 Prediabetes: Secondary | ICD-10-CM

## 2019-09-01 DIAGNOSIS — Z8673 Personal history of transient ischemic attack (TIA), and cerebral infarction without residual deficits: Secondary | ICD-10-CM

## 2019-09-01 DIAGNOSIS — E559 Vitamin D deficiency, unspecified: Secondary | ICD-10-CM

## 2019-09-01 DIAGNOSIS — E034 Atrophy of thyroid (acquired): Secondary | ICD-10-CM

## 2019-09-01 DIAGNOSIS — R202 Paresthesia of skin: Secondary | ICD-10-CM

## 2019-09-01 DIAGNOSIS — I672 Cerebral atherosclerosis: Secondary | ICD-10-CM | POA: Diagnosis not present

## 2019-09-01 DIAGNOSIS — E7800 Pure hypercholesterolemia, unspecified: Secondary | ICD-10-CM

## 2019-09-01 MED ORDER — LEVOTHYROXINE SODIUM 25 MCG PO TABS: 25.0000 ug | ORAL_TABLET | Freq: Every day | ORAL | 3 refills | Status: AC

## 2019-09-01 MED ORDER — CARVEDILOL 6.25 MG PO TABS: ORAL_TABLET | ORAL | 1 refills | Status: DC

## 2019-09-01 MED ORDER — ATORVASTATIN CALCIUM 80 MG PO TABS: 80.0000 mg | ORAL_TABLET | Freq: Every day | ORAL | 3 refills | Status: AC

## 2019-09-01 MED ORDER — GABAPENTIN 100 MG PO CAPS: 100.0000 mg | ORAL_CAPSULE | Freq: Every day | ORAL | 3 refills | Status: DC

## 2019-09-01 MED ORDER — FAMOTIDINE 20 MG PO TABS: 20.0000 mg | ORAL_TABLET | Freq: Two times a day (BID) | ORAL | 11 refills | Status: AC

## 2019-09-01 MED ORDER — CLOPIDOGREL BISULFATE 75 MG PO TABS: 75.0000 mg | ORAL_TABLET | Freq: Every day | ORAL | 3 refills | Status: AC

## 2019-09-01 MED ORDER — LEVETIRACETAM 500 MG PO TABS: 500.0000 mg | ORAL_TABLET | Freq: Two times a day (BID) | ORAL | 3 refills | Status: AC

## 2019-09-01 MED ORDER — AMITRIPTYLINE HCL 10 MG PO TABS: 10.0000 mg | ORAL_TABLET | Freq: Every day | ORAL | 3 refills | Status: AC

## 2019-09-01 MED ORDER — AMLODIPINE BESYLATE 10 MG PO TABS: 10.0000 mg | ORAL_TABLET | Freq: Every day | ORAL | 3 refills | Status: AC

## 2019-09-01 MED ORDER — HYDROCODONE-ACETAMINOPHEN 5-325 MG PO TABS: 2.0000 | ORAL_TABLET | Freq: Two times a day (BID) | ORAL | 0 refills | Status: DC

## 2019-09-01 NOTE — Patient Instructions (Signed)
° ° ° °  If you have lab work done today you will be contacted with your lab results within the next 2 weeks.  If you have not heard from us then please contact us. The fastest way to get your results is to register for My Chart. ° ° °IF you received an x-ray today, you will receive an invoice from [facility]. Please contact [facility] at [phone] with questions or concerns regarding your invoice.  ° °IF you received labwork today, you will receive an invoice from [facility]. Please contact [facility] at [phone] with questions or concerns regarding your invoice.  ° °Our billing staff will not be able to assist you with questions regarding bills from these companies. ° °You will be contacted with the lab results as soon as they are available. The fastest way to get your results is to activate your My Chart account. Instructions are located on the last page of this paperwork. If you have not heard from us regarding the results in 2 weeks, please contact this office. °  ° ° ° °

## 2019-09-01 NOTE — Progress Notes (Signed)
Established Patient Office Visit  Subjective:  Patient ID: Carla Manning, female    DOB: 07-08-1934  Age: 85 y.o. MRN: [id number]  CC:  Chief Complaint  Patient presents with  . Medication Refill    Need refill on medication    HPI Carla Manning presents for   Patient reports that medication refills and to check up on labs.  She has chronic pain and is cared for by her daughter and her son who helps. They have numerous mobility challenges and the patient is largely home bound.  She denies any recent falls.  She has a good appetite and sleeps well.   Past Medical History:  Diagnosis Date  . Anxiety   . Depression   . GERD (gastroesophageal reflux disease)   . Hyperlipidemia   . Hypertension   . Hypothyroid   . Seizures ([place])    post CVA  . Stroke [facility])    R sided weakness, aphasia    Past Surgical History:  Procedure Laterality Date  . NO PAST SURGERIES      Family History  Problem Relation Age of Onset  . Diabetes Mother   . Hypertension Daughter   . Hypertension Daughter   . Diabetes Daughter     Social History   Socioeconomic History  . Marital status: Divorced    Spouse name: Not on file  . Number of children: 12  . Years of education: 6  . Highest education level: 6th grade  Occupational History  . Occupation: Retired    Fish farm manager: NOT EMPLOYED  Tobacco Use  . Smoking status: Never Smoker  . Smokeless tobacco: Never Used  Substance and Sexual Activity  . Alcohol use: No    Alcohol/week: 0.0 standard drinks  . Drug use: No  . Sexual activity: Never    Birth control/protection: Post-menopausal  Other Topics Concern  . Not on file  Social History Narrative   Marital status: single/divorced.        Children: 12 children; 52 grandchildren; 86 gg.      Lives: with son       Employment: retired/disability.  Scientist, product/process development.         Tobacco: none      Alcohol: none      ADLs: wheelchair bound; no incontinence; walks to bathroom and down  the steps to the car; bathing self; needs some assistance with dressing; can brush teeth herself;no cooking; no driving.  No finances; no grocery shopping.  Caregiver all the time (sons, daughter)      Advanced Directives:  HCPOA [name] [phone]);  FULL CODE.        Patient is now left handed.   Patient has 6 th grade education.   Patient drinks 1 1/2 cup daily.   Social Determinants of Health   Financial Resource Strain:   . Difficulty of Paying Living Expenses:   Food Insecurity:   . Worried About Charity fundraiser in the Last Year:   . Arboriculturist in the Last Year:   Transportation Needs:   . Film/video editor (Medical):   Marland Kitchen Lack of Transportation (Non-Medical):   Physical Activity:   . Days of Exercise per Week:   . Minutes of Exercise per Session:   Stress:   . Feeling of Stress :   Social Connections:   . Frequency of Communication with Friends and Family:   . Frequency of Social Gatherings with Friends and Family:   . Attends  Religious Services:   . Active Member of Clubs or Organizations:   . Attends Archivist Meetings:   Marland Kitchen Marital Status:   Intimate Partner Violence:   . Fear of Current or Ex-Partner:   . Emotionally Abused:   Marland Kitchen Physically Abused:   . Sexually Abused:     Outpatient Medications Prior to Visit  Medication Sig Dispense Refill  . amitriptyline (ELAVIL) 10 MG tablet Take 1 tablet (10 mg total) by mouth at bedtime. 90 tablet 3  . amLODipine (NORVASC) 10 MG tablet Take 1 tablet (10 mg total) by mouth daily. 90 tablet 3  . atorvastatin (LIPITOR) 80 MG tablet Take 1 tablet (80 mg total) by mouth daily at 6 PM. 90 tablet 3  . carvedilol (COREG) 6.25 MG tablet TAKE 1 TABLET BY MOUTH 2 TIMES DAILY WITH FOOD 180 tablet 0  . clopidogrel (PLAVIX) 75 MG tablet Take 1 tablet (75 mg total) by mouth daily. 90 tablet 3  . famotidine (PEPCID) 20 MG tablet Take 1 tablet (20 mg total) by mouth 2 (two) times daily. 60 tablet 11  .  gabapentin (NEURONTIN) 100 MG capsule TAKE 1 CAPSULE BY MOUTH 2 TIMES DAILY 180 capsule 3  . HYDROcodone-acetaminophen (NORCO/VICODIN) 5-325 MG tablet Take 2 tablets by mouth 2 (two) times daily. 60 tablet 0  . levETIRAcetam (KEPPRA) 500 MG tablet Take 1 tablet (500 mg total) by mouth every 12 (twelve) hours. 180 tablet 3  . levothyroxine (SYNTHROID) 25 MCG tablet Take 1 tablet (25 mcg total) by mouth daily before breakfast. 90 tablet 3   No facility-administered medications prior to visit.    No Known Allergies  ROS Review of Systems    Objective:    Physical Exam  BP 140/80 (BP Location: Left Arm, Patient Position: Sitting, Cuff Size: Large)   Pulse (!) 59   Temp 97.6 F (36.4 C) (Temporal)   LMP 10/06/2016   SpO2 96%  Wt Readings from Last 3 Encounters:  11/18/18 224 lb (101.6 kg)  01/27/13 168 lb (76.2 kg)  08/28/12 184 lb 1.6 oz (83.5 kg)     Health Maintenance Due  Topic Date Due  . OPHTHALMOLOGY EXAM  Never done  . URINE MICROALBUMIN  Never done  . COVID-19 Vaccine (1) Never done  . TETANUS/TDAP  Never done  . DEXA SCAN  Never done  . HEMOGLOBIN A1C  05/21/2019    There are no preventive care reminders to display for this patient.  Lab Results  Component Value Date   TSH 2.370 11/18/2018   Lab Results  Component Value Date   WBC 4.4 11/18/2018   HGB 13.6 11/18/2018   HCT 40.8 11/18/2018   MCV 85 11/18/2018   PLT 164 11/18/2018   Lab Results  Component Value Date   NA 141 12/23/2018   K 3.8 12/23/2018   CO2 22 12/23/2018   GLUCOSE 137 (H) 12/23/2018   BUN 12 12/23/2018   CREATININE 1.17 (H) 12/23/2018   BILITOT 0.3 11/18/2018   ALKPHOS 222 (H) 11/18/2018   AST 54 (H) 11/18/2018   ALT 15 11/18/2018   PROT 9.3 (H) 11/18/2018   ALBUMIN 4.6 11/18/2018   CALCIUM 8.8 12/23/2018   ANIONGAP 4 (L) 04/25/2018   Lab Results  Component Value Date   CHOL 152 11/18/2018   Lab Results  Component Value Date   HDL 68 11/18/2018   Lab Results    Component Value Date   LDLCALC 69 11/18/2018   Lab Results  Component Value  Date   TRIG 77 11/18/2018   Lab Results  Component Value Date   CHOLHDL 2.2 11/18/2018   Lab Results  Component Value Date   HGBA1C 5.5 11/18/2018      Assessment & Plan:   Problem List Items Addressed This Visit      Cardiovascular and Mediastinum   Cerebrovascular disease, arteriosclerotic, post-stroke   Relevant Medications   amLODipine (NORVASC) 10 MG tablet   atorvastatin (LIPITOR) 80 MG tablet   carvedilol (COREG) 6.25 MG tablet     Endocrine   Hypothyroidism   Relevant Medications   carvedilol (COREG) 6.25 MG tablet   levothyroxine (SYNTHROID) 25 MCG tablet   Other Relevant Orders   TSH     Other   Paresthesia and pain of right extremity   Relevant Medications   amitriptyline (ELAVIL) 10 MG tablet   HYDROcodone-acetaminophen (NORCO/VICODIN) 5-325 MG tablet   Other Relevant Orders   CBC   HLD (hyperlipidemia)   Relevant Medications   amLODipine (NORVASC) 10 MG tablet   atorvastatin (LIPITOR) 80 MG tablet   carvedilol (COREG) 6.25 MG tablet   Other Relevant Orders   CMP14+EGFR   Lipid panel    Other Visit Diagnoses    Vitamin D deficiency    -  Primary   Relevant Orders   VITAMIN D 25 Hydroxy (Vit-D Deficiency, Fractures)   Prediabetes       Relevant Orders   CMP14+EGFR   Encounter for medication monitoring       Relevant Orders   CMP14+EGFR   CBC   Lipid panel    Patient stable on her current meds Continue current management    Meds ordered this encounter  Medications  . amitriptyline (ELAVIL) 10 MG tablet    Sig: Take 1 tablet (10 mg total) by mouth at bedtime.    Dispense:  90 tablet    Refill:  3  . amLODipine (NORVASC) 10 MG tablet    Sig: Take 1 tablet (10 mg total) by mouth daily.    Dispense:  90 tablet    Refill:  3  . atorvastatin (LIPITOR) 80 MG tablet    Sig: Take 1 tablet (80 mg total) by mouth daily at 6 PM.    Dispense:  90 tablet     Refill:  3  . carvedilol (COREG) 6.25 MG tablet    Sig: TAKE 1 TABLET BY MOUTH 2 TIMES DAILY WITH FOOD    Dispense:  180 tablet    Refill:  1  . clopidogrel (PLAVIX) 75 MG tablet    Sig: Take 1 tablet (75 mg total) by mouth daily.    Dispense:  90 tablet    Refill:  3  . famotidine (PEPCID) 20 MG tablet    Sig: Take 1 tablet (20 mg total) by mouth 2 (two) times daily.    Dispense:  60 tablet    Refill:  11  . gabapentin (NEURONTIN) 100 MG capsule    Sig: Take 1 capsule (100 mg total) by mouth at bedtime.    Dispense:  90 capsule    Refill:  3  . HYDROcodone-acetaminophen (NORCO/VICODIN) 5-325 MG tablet    Sig: Take 2 tablets by mouth 2 (two) times daily.    Dispense:  60 tablet    Refill:  0  . levETIRAcetam (KEPPRA) 500 MG tablet    Sig: Take 1 tablet (500 mg total) by mouth every 12 (twelve) hours.    Dispense:  180 tablet    Refill:  3    This prescription was filled on 03/28/2017. Any refills authorized will be placed on file.  . levothyroxine (SYNTHROID) 25 MCG tablet    Sig: Take 1 tablet (25 mcg total) by mouth daily before breakfast.    Dispense:  90 tablet    Refill:  3    Follow-up: No follow-ups on file.    Forrest Moron, MD

## 2019-09-02 LAB — CBC
Hematocrit: 40.3 % (ref 34.0–46.6)
Hemoglobin: 13.5 g/dL (ref 11.1–15.9)
MCH: 28.1 pg (ref 26.6–33.0)
MCHC: 33.5 g/dL (ref 31.5–35.7)
MCV: 84 fL (ref 79–97)
Platelets: 141 10*3/uL — ABNORMAL LOW (ref 150–450)
RBC: 4.81 x10E6/uL (ref 3.77–5.28)
RDW: 15.4 % (ref 11.7–15.4)
WBC: 4.4 10*3/uL (ref 3.4–10.8)

## 2019-09-02 NOTE — Progress Notes (Signed)
Letter sent as requested.

## 2019-11-10 ENCOUNTER — Other Ambulatory Visit: Payer: Self-pay

## 2019-11-10 DIAGNOSIS — I672 Cerebral atherosclerosis: Secondary | ICD-10-CM

## 2019-11-10 MED ORDER — CARVEDILOL 6.25 MG PO TABS: ORAL_TABLET | ORAL | 1 refills | Status: AC

## 2020-05-03 ENCOUNTER — Emergency Department (HOSPITAL_COMMUNITY)
Admission: EM | Admit: 2020-05-03 | Discharge: 2020-05-04 | Disposition: A | Discharge status: Home / Self Care | Payer: Medicare Other | Attending: Emergency Medicine | Admitting: Emergency Medicine

## 2020-05-03 ENCOUNTER — Encounter (HOSPITAL_COMMUNITY): Payer: Self-pay | Admitting: Emergency Medicine

## 2020-05-03 ENCOUNTER — Emergency Department (HOSPITAL_COMMUNITY): Payer: Medicare Other

## 2020-05-03 DIAGNOSIS — Z20822 Contact with and (suspected) exposure to covid-19: Secondary | ICD-10-CM | POA: Diagnosis not present

## 2020-05-03 DIAGNOSIS — N390 Urinary tract infection, site not specified: Secondary | ICD-10-CM | POA: Diagnosis not present

## 2020-05-03 DIAGNOSIS — I10 Essential (primary) hypertension: Secondary | ICD-10-CM | POA: Diagnosis not present

## 2020-05-03 DIAGNOSIS — R609 Edema, unspecified: Secondary | ICD-10-CM | POA: Diagnosis not present

## 2020-05-03 DIAGNOSIS — R531 Weakness: Secondary | ICD-10-CM | POA: Diagnosis not present

## 2020-05-03 DIAGNOSIS — Z79899 Other long term (current) drug therapy: Secondary | ICD-10-CM | POA: Insufficient documentation

## 2020-05-03 DIAGNOSIS — R32 Unspecified urinary incontinence: Secondary | ICD-10-CM | POA: Diagnosis present

## 2020-05-03 DIAGNOSIS — E039 Hypothyroidism, unspecified: Secondary | ICD-10-CM | POA: Diagnosis not present

## 2020-05-03 DIAGNOSIS — R410 Disorientation, unspecified: Secondary | ICD-10-CM | POA: Diagnosis not present

## 2020-05-03 LAB — CBC
HCT: 40.3 % (ref 36.0–46.0)
Hemoglobin: 13.8 g/dL (ref 12.0–15.0)
MCH: 27.1 pg (ref 26.0–34.0)
MCHC: 34.2 g/dL (ref 30.0–36.0)
MCV: 79 fL — ABNORMAL LOW (ref 80.0–100.0)
Platelets: 160 10*3/uL (ref 150–400)
RBC: 5.1 MIL/uL (ref 3.87–5.11)
RDW: 15.3 % (ref 11.5–15.5)
WBC: 6.4 10*3/uL (ref 4.0–10.5)
nRBC: 0 % (ref 0.0–0.2)

## 2020-05-03 LAB — URINALYSIS, ROUTINE W REFLEX MICROSCOPIC
Bilirubin Urine: NEGATIVE
Glucose, UA: NEGATIVE mg/dL
Ketones, ur: NEGATIVE mg/dL
Nitrite: NEGATIVE
Protein, ur: NEGATIVE mg/dL
Specific Gravity, Urine: 1.015 (ref 1.005–1.030)
pH: 7 (ref 5.0–8.0)

## 2020-05-03 LAB — COMPREHENSIVE METABOLIC PANEL
ALT: 16 U/L (ref 0–44)
AST: 38 U/L (ref 15–41)
Albumin: 4.1 g/dL (ref 3.5–5.0)
Alkaline Phosphatase: 180 U/L — ABNORMAL HIGH (ref 38–126)
Anion gap: 12 (ref 5–15)
BUN: 10 mg/dL (ref 8–23)
CO2: 28 mmol/L (ref 22–32)
Calcium: 9 mg/dL (ref 8.9–10.3)
Chloride: 98 mmol/L (ref 98–111)
Creatinine, Ser: 1.15 mg/dL — ABNORMAL HIGH (ref 0.44–1.00)
GFR, Estimated: 47 mL/min — ABNORMAL LOW (ref >60)
Glucose, Bld: 119 mg/dL — ABNORMAL HIGH (ref 70–99)
Potassium: 4.2 mmol/L (ref 3.5–5.1)
Sodium: 138 mmol/L (ref 135–145)
Total Bilirubin: 1.2 mg/dL (ref 0.3–1.2)
Total Protein: 9.8 g/dL — ABNORMAL HIGH (ref 6.5–8.1)

## 2020-05-03 LAB — POC SARS CORONAVIRUS 2 AG -  ED: SARS Coronavirus 2 Ag: NEGATIVE

## 2020-05-03 LAB — URINALYSIS, MICROSCOPIC (REFLEX)

## 2020-05-03 MED ORDER — SODIUM CHLORIDE 0.9 % IV SOLN
1.0000 g | Freq: Once | INTRAVENOUS | Status: DC
  Filled 2020-05-03: qty 10

## 2020-05-03 MED ORDER — CEPHALEXIN 500 MG PO CAPS
500.0000 mg | ORAL_CAPSULE | Freq: Once | ORAL | Status: AC
  Administered 2020-05-03: 500 mg via ORAL
  Filled 2020-05-03: qty 1

## 2020-05-03 MED ORDER — CEPHALEXIN 500 MG PO CAPS: 500.0000 mg | ORAL_CAPSULE | Freq: Three times a day (TID) | ORAL | 0 refills | Status: AC

## 2020-05-03 NOTE — ED Provider Notes (Signed)
[facility]-EMERGENCY DEPT Provider Note   CSN: [id number] Arrival date & time: 05/03/20  1416     History Chief Complaint  Patient presents with  . Altered Mental Status    Carla Manning is a 86 y.o. female with history of CVA with residual right-sided weakness, hypertension, hypothyroidism, brought in by daughter for altered mental status and weakness.  Patient's daughter states that last 3 days she has gone significantly weak compared to her baseline.  She states normally she is able to shuffle her right leg that has chronic weakness after stroke, and bear weight on her left leg without difficulty.  She states she has been so weak she has not been able to stand at all, she has not been able to sit up in bed due to weakness.  She also states over the last week and a half she has been more confused than usual, speech is not making sense.  She states over the last 2 weeks she has noticed she has been more incontinent of urine than usual and it has a strong odor to it.  She has not had any fevers though has had some mild intermittent cough.  She is eating and drinking normally, not complaining of abdominal pain.  No vomiting or diarrhea.  Patient's daughter provides a history. Level 5 caveat secondary to altered mental status.  History provided by: daughter/caregiver. The history is limited by the condition of the patient.       Past Medical History:  Diagnosis Date  . Anxiety   . Depression   . GERD (gastroesophageal reflux disease)   . Hyperlipidemia   . Hypertension   . Hypothyroid   . Seizures ([facility])    post CVA  . Stroke [facility])    R sided weakness, aphasia    Patient Active Problem List   Diagnosis Date Noted  . History of CVA with residual deficit 07/20/2015  . Vitamin B6 deficiency neuropathy 06/25/2014  . Occipital neuralgia 06/25/2014  . Essential hypertension 06/25/2014  . HLD (hyperlipidemia) 06/25/2014  . Paresthesia and pain of right  extremity 04/09/2013  . Hypothyroidism 08/31/2012  . Partial seizure ([facility]) 08/29/2012  . GERD (gastroesophageal reflux disease) 05/30/2011  . Cerebrovascular disease, arteriosclerotic, post-stroke 05/13/2010    Past Surgical History:  Procedure Laterality Date  . NO PAST SURGERIES       OB History   No obstetric history on file.     Family History  Problem Relation Age of Onset  . Diabetes Mother   . Hypertension Daughter   . Hypertension Daughter   . Diabetes Daughter     Social History   Tobacco Use  . Smoking status: Never Smoker  . Smokeless tobacco: Never Used  Vaping Use  . Vaping Use: Never used  Substance Use Topics  . Alcohol use: No    Alcohol/week: 0.0 standard drinks  . Drug use: No    Home Medications Prior to Admission medications   Medication Sig Start Date End Date Taking? Authorizing Provider  cephALEXin (KEFLEX) 500 MG capsule Take 1 capsule (500 mg total) by mouth 3 (three) times daily for 7 days. 05/03/20 05/10/20 Yes Roxan Hockey, [place] N, PA-C  amitriptyline (ELAVIL) 10 MG tablet Take 1 tablet (10 mg total) by mouth at bedtime. 09/01/19   Doristine Bosworth, MD  amLODipine (NORVASC) 10 MG tablet Take 1 tablet (10 mg total) by mouth daily. 09/01/19   Doristine Bosworth, MD  atorvastatin (LIPITOR) 80 MG tablet Take 1  tablet (80 mg total) by mouth daily at 6 PM. 09/01/19   Delia Chimes A, MD  carvedilol (COREG) 6.25 MG tablet TAKE 1 TABLET BY MOUTH 2 TIMES DAILY WITH FOOD 11/10/19   Jacelyn Pi, Lilia Argue, MD  clopidogrel (PLAVIX) 75 MG tablet Take 1 tablet (75 mg total) by mouth daily. 09/01/19   Forrest Moron, MD  famotidine (PEPCID) 20 MG tablet Take 1 tablet (20 mg total) by mouth 2 (two) times daily. 09/01/19   Forrest Moron, MD  gabapentin (NEURONTIN) 100 MG capsule Take 1 capsule (100 mg total) by mouth at bedtime. 09/01/19   Forrest Moron, MD  HYDROcodone-acetaminophen (NORCO/VICODIN) 5-325 MG tablet Take 2 tablets by mouth 2 (two) times daily. 09/01/19    Forrest Moron, MD  levETIRAcetam (KEPPRA) 500 MG tablet Take 1 tablet (500 mg total) by mouth every 12 (twelve) hours. 09/01/19   Forrest Moron, MD  levothyroxine (SYNTHROID) 25 MCG tablet Take 1 tablet (25 mcg total) by mouth daily before breakfast. 09/01/19   Forrest Moron, MD    Allergies    Patient has no known allergies.  Review of Systems   Review of Systems  Unable to perform ROS: Mental status change    Physical Exam Updated Vital Signs BP (!) 120/50   Pulse 79   Temp 98 F (36.7 C) (Oral)   Resp 18   Ht 5' (1.524 m)   Wt 102 kg   LMP 10/06/2016   SpO2 96%   BMI 43.92 kg/m   Physical Exam Vitals and nursing note reviewed.  Constitutional:      General: She is not in acute distress.    Appearance: She is well-developed and well-nourished.     Comments: Pleasantly confused  HENT:     Head: Normocephalic and atraumatic.     Mouth/Throat:     Mouth: Mucous membranes are moist.  Eyes:     Conjunctiva/sclera: Conjunctivae normal.  Cardiovascular:     Rate and Rhythm: Normal rate and regular rhythm.  Pulmonary:     Effort: Pulmonary effort is normal. No respiratory distress.  Abdominal:     General: Bowel sounds are normal.     Palpations: Abdomen is soft.     Tenderness: There is no abdominal tenderness.  Musculoskeletal:     Comments: RLE edema.   Skin:    General: Skin is warm.  Neurological:     Mental Status: She is alert.     Comments: Patient is able to tell me her name, she is also able to state the word [facility] however this was in response to question of what type of building we were in.  When asked the date she looked the clock and said some numbers.   Patient is minimally following commands.  She does have strong grip strength in left upper extremity.  Extraocular movements appear grossly intact.  PERRL.  Smile appears symmetric.  Unable to perform any other additional neuro exam as patient unable to follow simple commands.  Psychiatric:         Mood and Affect: Mood and affect normal.        Behavior: Behavior normal.     ED Results / Procedures / Treatments   Labs (all labs ordered are listed, but only abnormal results are displayed) Labs Reviewed  COMPREHENSIVE METABOLIC PANEL - Abnormal; Notable for the following components:      Result Value   Glucose, Bld 119 (*)    Creatinine, Ser  1.15 (*)    Total Protein 9.8 (*)    Alkaline Phosphatase 180 (*)    GFR, Estimated 47 (*)    All other components within normal limits  CBC - Abnormal; Notable for the following components:   MCV 79.0 (*)    All other components within normal limits  URINALYSIS, ROUTINE W REFLEX MICROSCOPIC - Abnormal; Notable for the following components:   Hgb urine dipstick TRACE (*)    Leukocytes,Ua SMALL (*)    All other components within normal limits  URINALYSIS, MICROSCOPIC (REFLEX) - Abnormal; Notable for the following components:   Bacteria, UA MANY (*)    All other components within normal limits  URINE CULTURE  POC SARS CORONAVIRUS 2 AG -  ED    EKG None  Radiology CT Head Wo Contrast  Result Date: 05/03/2020 CLINICAL DATA:  Increasing confusion since yesterday, history of prior stroke with right-sided residual deficit EXAM: CT HEAD WITHOUT CONTRAST TECHNIQUE: Contiguous axial images were obtained from the base of the skull through the vertex without intravenous contrast. COMPARISON:  04/25/2018 FINDINGS: Brain: Stable encephalomalacia left MCA territory consistent with prior infarct. No signs of acute infarct or hemorrhage. The lateral ventricles and midline structures are stable. No acute extra-axial fluid collections. No mass effect. Vascular: No hyperdense vessel or unexpected calcification. Skull: Normal. Negative for fracture or focal lesion. Sinuses/Orbits: No acute finding. Other: None. IMPRESSION: 1. Stable sequela from chronic left MCA infarct. 2. No acute intracranial process. Electronically Signed   By: Sharlet Salina M.D.    On: 05/03/2020 21:16   DG Chest Port 1 View  Result Date: 05/03/2020 CLINICAL DATA:  Cough.  Altered mental status. EXAM: PORTABLE CHEST 1 VIEW COMPARISON:  08/28/2012 FINDINGS: The heart size is mildly enlarged. The pulmonary vasculature appears prominent. There is a questionable airspace opacity in the medial right upper lung zone. There is no large pleural effusion or pneumothorax. There is slight shift of the trachea to the right which is felt to be projectional. There are degenerative changes of both glenohumeral joints. IMPRESSION: 1. No definite acute cardiopulmonary process. 2. Dilated pulmonary vasculature suggestive of pulmonary artery hypertension. 3. Questionable airspace opacity in the medial right upper lung zone. A 4-6 week follow-up two-view chest x-ray is recommended to confirm stability or resolution of this finding. Electronically Signed   By: Katherine Mantle M.D.   On: 05/03/2020 20:35    Procedures Procedures (including critical care time)  Medications Ordered in ED Medications  cefTRIAXone (ROCEPHIN) 1 g in sodium chloride 0.9 % 100 mL IVPB (has no administration in time range)    ED Course  I have reviewed the triage vital signs and the nursing notes.  Pertinent labs & imaging results that were available during my care of the patient were reviewed by me and considered in my medical decision making (see chart for details).    MDM Rules/Calculators/A&P                          Patient brought in by daughter for altered mental status over the last week and a half, generalized weakness began 3 days ago.  Does report some urinary incontinence over the last 2 weeks.  She has history of stroke with residual right-sided weakness and intermittent aphasia though she is more confused than baseline.  She is not following many commands on examination, and is pleasantly confused.    She has right-sided paralysis noted with contracture to the right  upper extremity.  She does have  strong grip strength to the left upper extremity.  Smile appears symmetric.  She is afebrile with stable vital signs on exam.  Considering altered mental status, differential diagnosis includes UTI, acute/subacute stroke, other infectious process.  Laboratory work-up ordered, also including Covid swab, chest x-ray, head CT.  Blood work and imaging is reassuring.  No leukocytosis or acute electrolyte abnormality.  Renal function is at baseline.  Covid swab is negative.  UA is consistent with infection, urine culture sent.  This likely explains patient's intermittent worsening confusion from baseline, as well as urinary incontinence with foul-smelling urine.  Had shared decision making with patient's daughter regarding discharge vs admission.  She would prefer discharge to home with trial of outpatient antibiotics.  I believe this is reasonable, attending physician Dr. Stevie Kern is in agreement as well.  Instructed close follow-up with PCP and strict return precautions if symptoms persist or worsen in any way.  Patient's daughter verbalized understanding and agrees with care plan.  Patient is discharged in no acute distress with stable vital signs.   Carla Manning was evaluated in Emergency Department on 05/03/2020 for the symptoms described in the history of present illness. She was evaluated in the context of the global COVID-19 pandemic, which necessitated consideration that the patient might be at risk for infection with the SARS-CoV-2 virus that causes COVID-19. Institutional protocols and algorithms that pertain to the evaluation of patients at risk for COVID-19 are in a state of rapid change based on information released by regulatory bodies including the CDC and federal and state organizations. These policies and algorithms were followed during the patient's care in the ED.  Final Clinical Impression(s) / ED Diagnoses Final diagnoses:  Confusion  Weakness  Acute lower UTI    Rx / DC Orders ED  Discharge Orders         Ordered    cephALEXin (KEFLEX) 500 MG capsule  3 times daily        05/03/20 2150           Dellas Guard, [place] N, PA-C 05/03/20 2201    Milagros Loll, MD 05/03/20 2340

## 2020-05-03 NOTE — ED Triage Notes (Signed)
Per EMS, patient from home, family reports increased episodes of confusion since yesterday. Hx stroke with right side deficit. A&Ox2 at baseline.

## 2020-05-03 NOTE — Discharge Instructions (Signed)
Starting tomorrow, please give her the antibiotic as directed until gone.  Please follow closely with her PCP as soon as possible for recheck.  Return to the ER if she shows no improvement, or worsens in any way - including fever, worsening mental status, vomiting, or other concerning symptoms.

## 2020-05-03 NOTE — ED Notes (Addendum)
[facility] called for transport home. 

## 2020-05-03 NOTE — ED Notes (Signed)
Attempted blood draw x 1 without success 

## 2020-05-04 LAB — URINE CULTURE: Culture: 30000 — AB

## 2020-12-23 ENCOUNTER — Emergency Department (HOSPITAL_BASED_OUTPATIENT_CLINIC_OR_DEPARTMENT_OTHER)
Admission: EM | Admit: 2020-12-23 | Discharge: 2020-12-23 | Disposition: A | Discharge status: Home / Self Care | Payer: Medicare Other | Attending: Emergency Medicine | Admitting: Emergency Medicine

## 2020-12-23 ENCOUNTER — Encounter (HOSPITAL_BASED_OUTPATIENT_CLINIC_OR_DEPARTMENT_OTHER): Payer: Self-pay | Admitting: Emergency Medicine

## 2020-12-23 ENCOUNTER — Other Ambulatory Visit: Payer: Self-pay

## 2020-12-23 DIAGNOSIS — Z789 Other specified health status: Secondary | ICD-10-CM

## 2020-12-23 DIAGNOSIS — I10 Essential (primary) hypertension: Secondary | ICD-10-CM | POA: Diagnosis not present

## 2020-12-23 DIAGNOSIS — E039 Hypothyroidism, unspecified: Secondary | ICD-10-CM | POA: Insufficient documentation

## 2020-12-23 DIAGNOSIS — F0390 Unspecified dementia without behavioral disturbance: Secondary | ICD-10-CM | POA: Diagnosis not present

## 2020-12-23 DIAGNOSIS — Z79899 Other long term (current) drug therapy: Secondary | ICD-10-CM | POA: Diagnosis not present

## 2020-12-23 DIAGNOSIS — G629 Polyneuropathy, unspecified: Secondary | ICD-10-CM | POA: Diagnosis present

## 2020-12-23 LAB — BASIC METABOLIC PANEL
Anion gap: 10 (ref 5–15)
BUN: 7 mg/dL — ABNORMAL LOW (ref 8–23)
CO2: 32 mmol/L (ref 22–32)
Calcium: 9.2 mg/dL (ref 8.9–10.3)
Chloride: 97 mmol/L — ABNORMAL LOW (ref 98–111)
Creatinine, Ser: 0.68 mg/dL (ref 0.44–1.00)
GFR, Estimated: >60 mL/min (ref >60)
Glucose, Bld: 96 mg/dL (ref 70–99)
Potassium: 3.4 mmol/L — ABNORMAL LOW (ref 3.5–5.1)
Sodium: 139 mmol/L (ref 135–145)

## 2020-12-23 LAB — MAGNESIUM: Magnesium: 1.7 mg/dL (ref 1.7–2.4)

## 2020-12-23 MED ORDER — GABAPENTIN 100 MG PO CAPS: 100.0000 mg | ORAL_CAPSULE | Freq: Three times a day (TID) | ORAL | 3 refills | Status: AC

## 2020-12-23 MED ORDER — POTASSIUM CHLORIDE CRYS ER 20 MEQ PO TBCR
20.0000 meq | EXTENDED_RELEASE_TABLET | Freq: Once | ORAL | Status: AC
  Administered 2020-12-23: 20 meq via ORAL
  Filled 2020-12-23: qty 1

## 2020-12-23 NOTE — ED Notes (Signed)
Patient used bedpan and voided 300 mL of urine. No other needs voiced at this time.

## 2020-12-23 NOTE — Discharge Instructions (Addendum)
Continue taking your Elavil.  Your prescription for gabapentin has been up to 3 times a day.  Social work was contacted to establish home health care, get you in touch with a new PCP and initiate the process for possible skilled nursing facility placement.

## 2020-12-23 NOTE — Care Management (Signed)
ED CM spoke with the patient's daughter [place] via telephone. Per SW and the patient's daughter, the patient is unable to afford the SNF co-pay she will incur after 20 days. She reports she can take the patient home with the assistance of home health. CM explained home health care can provide intermittent assistance but not 24/7 care. She verbalizes understanding. The patient's daughter reports the patient had OT in the past with "[facility] [place] [facility]" and she can use the same agency. We discussed referring to a different agency if [facility] is unable to accept her and she agrees.   CM called [facility] and provided contact information for a call back to confirm receipt of the referral. Home health orders and demographic sheet faxed to [facility] and confirmation was received. No return call received from the agency.

## 2020-12-23 NOTE — ED Triage Notes (Signed)
Pt arrives to ED via [facility] with daughter. Pt with hx of stroke and unable to communicate with staff. Daughter reports pt has had increased pain in legs and feet described as burning over the past two days. Pt w/ hx of peripheral neuropathy.

## 2020-12-23 NOTE — ED Notes (Signed)
Patient placed on PureWick, daughter at bedside.  Procedure explained and patient and daughter agreeable.

## 2020-12-23 NOTE — ED Notes (Signed)
Patient placed on bedpan per daughter's request.

## 2020-12-23 NOTE — Progress Notes (Signed)
Transition of Care [facility]) - Emergency Department Mini Assessment   Patient Details  Name: Carla Manning MRN: [id number] Date of Birth: 1935/03/22  Transition of Care [facility]) CM/SW Contact:    Carla Manning, LCSWA Phone Number: 12/23/2020, 5:57 PM   Clinical Narrative: [facility] CSW consulted with pts daughter/Carla Manning, also pts POA.  Carla Manning inquired about respite care or [facility] for pt.  CSW provided Carla Manning with PCP/Carla Lum Keas, MD ofc #'s.  Carla Manning, MSW, LCSW-A Pronouns:  She/Her/Hers [facility] Clinical Social Worker Direct Number:  [phone] [email or web address]   ED Mini Assessment: What brought you to the Emergency Department? : Pain in legs and feet  Barriers to Discharge: No Barriers Identified     Means of departure: Not know  Interventions which prevented an admission or readmission: SNF Placement, Home Health Consult or Services    Patient Contact and Communications Key Contact 1: Carla Manning   Spoke with: Carla Manning Date: 12/23/20,     Contact Phone Number: [phone] Call outcome: Discuss dc plan for pt.  Patient states their goals for this hospitalization and ongoing recovery are:: Carla Manning inquired about respite care and HH.   Choice offered to / list presented to : Adult Children  Admission diagnosis:  . Patient Active Problem List   Diagnosis Date Noted   History of CVA with residual deficit 07/20/2015   Vitamin B6 deficiency neuropathy 06/25/2014   Occipital neuralgia 06/25/2014   Essential hypertension 06/25/2014   HLD (hyperlipidemia) 06/25/2014   Paresthesia and pain of right extremity 04/09/2013   Hypothyroidism 08/31/2012   Partial seizure (HCC) 08/29/2012   GERD (gastroesophageal reflux disease) 05/30/2011   Cerebrovascular disease, arteriosclerotic, post-stroke 05/13/2010   PCP:  Carla Bosworth, MD Pharmacy:   [facility],  [place] - [address] [place] [address] Phone: [phone] Fax: [phone]

## 2020-12-23 NOTE — Progress Notes (Signed)
CSW attempted to contact pt's daughter [name] [phone]) , received no response left VM.  Valentina Shaggy.Chavis Tessler, MSW, LCSWA [facility]  Transitions of Care Clinical Social Worker I Direct Dial: [phone]  Fax: [phone] [email or web address]

## 2020-12-23 NOTE — ED Provider Notes (Signed)
   4:15 PM Patient signed out to me by previous ED physician.  Patient is a 86 yo female with pmh of peripheral neuropathy presenting for nerve pain. Patient signed out to me while awaiting social work consult for home nursing, PT, and resources on assisted living centers. I spoke with social care worker Margaretha Glassing who was able to speak with patient's daughter over the phone and assist.    Patient in no distress and overall condition improved here in the ED. Detailed discussions were had with the patient regarding current findings, and need for close f/u with PCP or on call doctor. The patient has been instructed to return immediately if the symptoms worsen in any way for re-evaluation. Patient verbalized understanding and is in agreement with current care plan. All questions answered prior to discharge.   Franne Forts, DO 12/23/20 1945

## 2020-12-23 NOTE — ED Provider Notes (Signed)
[facility] EMERGENCY DEPT Provider Note   CSN: [id number] Arrival date & time: 12/23/20  1211     History Chief Complaint  Patient presents with   Peripheral Neuropathy    Carla Manning is a 86 y.o. female.  HPI  86 year old female with a history of HTN, HLD, seizures, prior CVA with residual right-sided weakness and aphasia, dementia who presents to the emergency department with concern for worsening peripheral neuropathy.  The patient history was provided by the patient's daughter as the patient is mostly nonverbal at baseline.  Per the patient's daughter, she is at her normal baseline mental status.  No new neurologic focal deficits.  She has previously been prescribed amitriptyline and gabapentin for peripheral neuropathy but has overtime had her gabapentin weaned down to 100 mg nightly.  Per the patient's daughter, she has been communicating increased pain in her legs and feet described as a burning sensation over the past 2 days.  She currently has no PCP.  The patient's daughter states that she has had increasing difficulty managing her ADLs and caring for her in the home.  She is interested in social work consultation for possible skilled nursing facility placement.  She is also in the interim, looking for assistance with home health.  She used to have a PCP but believes that her PCP was diagnosed with cancer and she has been unable to contact the physician.  She is looking for a new PCP for her.  No other current medical complaints per the patient's daughter at this time.  Level 5 caveat due to dementia.  Past Medical History:  Diagnosis Date   Anxiety    Depression    GERD (gastroesophageal reflux disease)    Hyperlipidemia    Hypertension    Hypothyroid    Seizures (HCC)    post CVA   Stroke [facility])    R sided weakness, aphasia    Patient Active Problem List   Diagnosis Date Noted   History of CVA with residual deficit 07/20/2015   Vitamin B6 deficiency  neuropathy 06/25/2014   Occipital neuralgia 06/25/2014   Essential hypertension 06/25/2014   HLD (hyperlipidemia) 06/25/2014   Paresthesia and pain of right extremity 04/09/2013   Hypothyroidism 08/31/2012   Partial seizure (HCC) 08/29/2012   GERD (gastroesophageal reflux disease) 05/30/2011   Cerebrovascular disease, arteriosclerotic, post-stroke 05/13/2010    Past Surgical History:  Procedure Laterality Date   NO PAST SURGERIES       OB History   No obstetric history on file.     Family History  Problem Relation Age of Onset   Diabetes Mother    Hypertension Daughter    Hypertension Daughter    Diabetes Daughter     Social History   Tobacco Use   Smoking status: Never   Smokeless tobacco: Never  Vaping Use   Vaping Use: Never used  Substance Use Topics   Alcohol use: No    Alcohol/week: 0.0 standard drinks   Drug use: No    Home Medications Prior to Admission medications   Medication Sig Start Date End Date Taking? Authorizing Provider  amitriptyline (ELAVIL) 10 MG tablet Take 1 tablet (10 mg total) by mouth at bedtime. 09/01/19   Doristine Bosworth, MD  amLODipine (NORVASC) 10 MG tablet Take 1 tablet (10 mg total) by mouth daily. 09/01/19   Doristine Bosworth, MD  atorvastatin (LIPITOR) 80 MG tablet Take 1 tablet (80 mg total) by mouth daily at 6 PM. 09/01/19  Collie Siad A, MD  carvedilol (COREG) 6.25 MG tablet TAKE 1 TABLET BY MOUTH 2 TIMES DAILY WITH FOOD 11/10/19   Lezlie Lye, Meda Coffee, MD  clopidogrel (PLAVIX) 75 MG tablet Take 1 tablet (75 mg total) by mouth daily. 09/01/19   Doristine Bosworth, MD  famotidine (PEPCID) 20 MG tablet Take 1 tablet (20 mg total) by mouth 2 (two) times daily. 09/01/19   Doristine Bosworth, MD  gabapentin (NEURONTIN) 100 MG capsule Take 1 capsule (100 mg total) by mouth 3 (three) times daily. 12/23/20   Ernie Avena, MD  HYDROcodone-acetaminophen (NORCO/VICODIN) 5-325 MG tablet Take 2 tablets by mouth 2 (two) times daily. 09/01/19    Doristine Bosworth, MD  levETIRAcetam (KEPPRA) 500 MG tablet Take 1 tablet (500 mg total) by mouth every 12 (twelve) hours. 09/01/19   Doristine Bosworth, MD  levothyroxine (SYNTHROID) 25 MCG tablet Take 1 tablet (25 mcg total) by mouth daily before breakfast. 09/01/19   Doristine Bosworth, MD    Allergies    Patient has no known allergies.  Review of Systems   Review of Systems  Unable to perform ROS: Dementia   Physical Exam Updated Vital Signs BP 119/90   Pulse 68   Temp 98.3 F (36.8 C) (Oral)   Resp 18   Ht 4\' 11"  (1.499 m)   Wt 102 kg   LMP 10/06/2016   SpO2 96%   BMI 45.42 kg/m   Physical Exam Vitals and nursing note reviewed.  Constitutional:      General: She is not in acute distress.    Appearance: She is well-developed. She is not ill-appearing.     Comments: At baseline AAO x1, follows commands  HENT:     Head: Normocephalic and atraumatic.  Eyes:     Conjunctiva/sclera: Conjunctivae normal.  Cardiovascular:     Rate and Rhythm: Normal rate and regular rhythm.     Pulses: Normal pulses.     Heart sounds: No murmur heard. Pulmonary:     Effort: Pulmonary effort is normal. No respiratory distress.     Breath sounds: Normal breath sounds.  Abdominal:     Palpations: Abdomen is soft.     Tenderness: There is no abdominal tenderness.  Musculoskeletal:     Cervical back: Neck supple.  Skin:    General: Skin is warm and dry.     Comments: Stage II sacral pressure ulcer present, no surrounding erythema, scant drainage present  Neurological:     Mental Status: She is alert.     Comments: At baseline.  AAO x1, follows commands, will attempt to verbalize name but with difficulty, at baseline per daughter.  No obvious cranial nerve deficit.  Right-sided deficits to include 2 out of 5 strength in the right hemibody at baseline per the patient's daughter.  5 out of 5 strength in the left hemibody.  Nonambulatory at baseline.    ED Results / Procedures / Treatments    Labs (all labs ordered are listed, but only abnormal results are displayed) Labs Reviewed  BASIC METABOLIC PANEL - Abnormal; Notable for the following components:      Result Value   Potassium 3.4 (*)    Chloride 97 (*)    BUN 7 (*)    All other components within normal limits  MAGNESIUM  CBC WITH DIFFERENTIAL/PLATELET  CBC WITH DIFFERENTIAL/PLATELET    EKG None  Radiology No results found.  Procedures Procedures   Medications Ordered in ED Medications  potassium chloride  SA (KLOR-CON) CR tablet 20 mEq (20 mEq Oral Given 12/23/20 1639)    ED Course  I have reviewed the triage vital signs and the nursing notes.  Pertinent labs & imaging results that were available during my care of the patient were reviewed by me and considered in my medical decision making (see chart for details).    MDM Rules/Calculators/A&P                           86 year old female with medical history as above presenting with worsening symptoms of peripheral neuropathy.  Currently on Elavil and gabapentin 100 mg nightly which has been weaned from previous dosages.  Patient is overall at her baseline mental status per her daughter.  Her neurologic exam is at baseline.  No infectious symptoms.  Presenting primarily due to social concerns of lack of primary care provider, request for home health nursing assistance, interested in skilled nursing facility placement.  Regarding the patient's peripheral neuropathy, we will plan to increase the patient's gabapentin to 100 mg 3 times daily, otherwise no changes at this time.  Patient will need to establish care with a new PCP.  Given the above social concerns, social work was consulted for further recommendations.  Screening labs were collected which revealed normal magnesium, mild hypokalemia which was replenished orally.  The patient has been appropriately medically screened and/or stabilized in the ED. I have low suspicion for any other emergent medical  condition which would require further screening, evaluation or treatment in the ED or require inpatient management.  Plan for discharge pending consultation with social work.  Signout given to Dr. Wallace Cullens at 1500.  Final Clinical Impression(s) / ED Diagnoses Final diagnoses:  Neuropathy  Need for follow-up by social worker    Rx / DC Orders ED Discharge Orders          Ordered    gabapentin (NEURONTIN) 100 MG capsule  3 times daily        12/23/20 1611             Ernie Avena, MD 12/23/20 1951

## 2021-01-19 ENCOUNTER — Other Ambulatory Visit: Payer: Self-pay

## 2021-01-19 ENCOUNTER — Emergency Department (HOSPITAL_COMMUNITY): Payer: Medicare Other

## 2021-01-19 ENCOUNTER — Encounter (HOSPITAL_COMMUNITY): Payer: Self-pay

## 2021-01-19 ENCOUNTER — Inpatient Hospital Stay (HOSPITAL_COMMUNITY)
Admission: EM | Admit: 2021-01-19 | Discharge: 2021-01-25 | DRG: 593 | Disposition: A | Discharge status: Home Health | Payer: Medicare Other | Attending: Internal Medicine | Admitting: Internal Medicine

## 2021-01-19 DIAGNOSIS — I69351 Hemiplegia and hemiparesis following cerebral infarction affecting right dominant side: Secondary | ICD-10-CM

## 2021-01-19 DIAGNOSIS — Z8249 Family history of ischemic heart disease and other diseases of the circulatory system: Secondary | ICD-10-CM

## 2021-01-19 DIAGNOSIS — L89159 Pressure ulcer of sacral region, unspecified stage: Secondary | ICD-10-CM | POA: Diagnosis not present

## 2021-01-19 DIAGNOSIS — E039 Hypothyroidism, unspecified: Secondary | ICD-10-CM | POA: Diagnosis present

## 2021-01-19 DIAGNOSIS — Z20822 Contact with and (suspected) exposure to covid-19: Secondary | ICD-10-CM | POA: Diagnosis present

## 2021-01-19 DIAGNOSIS — L03317 Cellulitis of buttock: Secondary | ICD-10-CM | POA: Diagnosis present

## 2021-01-19 DIAGNOSIS — L89313 Pressure ulcer of right buttock, stage 3: Secondary | ICD-10-CM | POA: Diagnosis present

## 2021-01-19 DIAGNOSIS — Z833 Family history of diabetes mellitus: Secondary | ICD-10-CM

## 2021-01-19 DIAGNOSIS — I69320 Aphasia following cerebral infarction: Secondary | ICD-10-CM | POA: Diagnosis not present

## 2021-01-19 DIAGNOSIS — Z79899 Other long term (current) drug therapy: Secondary | ICD-10-CM

## 2021-01-19 DIAGNOSIS — I10 Essential (primary) hypertension: Secondary | ICD-10-CM | POA: Diagnosis present

## 2021-01-19 DIAGNOSIS — Z7401 Bed confinement status: Secondary | ICD-10-CM

## 2021-01-19 DIAGNOSIS — Z79890 Hormone replacement therapy (postmenopausal): Secondary | ICD-10-CM | POA: Diagnosis not present

## 2021-01-19 DIAGNOSIS — Z7902 Long term (current) use of antithrombotics/antiplatelets: Secondary | ICD-10-CM

## 2021-01-19 DIAGNOSIS — M79609 Pain in unspecified limb: Secondary | ICD-10-CM

## 2021-01-19 DIAGNOSIS — F0390 Unspecified dementia without behavioral disturbance: Secondary | ICD-10-CM | POA: Diagnosis present

## 2021-01-19 DIAGNOSIS — E785 Hyperlipidemia, unspecified: Secondary | ICD-10-CM | POA: Diagnosis present

## 2021-01-19 DIAGNOSIS — L03319 Cellulitis of trunk, unspecified: Secondary | ICD-10-CM

## 2021-01-19 DIAGNOSIS — L89154 Pressure ulcer of sacral region, stage 4: Principal | ICD-10-CM | POA: Diagnosis present

## 2021-01-19 DIAGNOSIS — R202 Paresthesia of skin: Secondary | ICD-10-CM

## 2021-01-19 DIAGNOSIS — G40909 Epilepsy, unspecified, not intractable, without status epilepticus: Secondary | ICD-10-CM | POA: Diagnosis present

## 2021-01-19 DIAGNOSIS — E531 Pyridoxine deficiency: Secondary | ICD-10-CM | POA: Diagnosis present

## 2021-01-19 DIAGNOSIS — K219 Gastro-esophageal reflux disease without esophagitis: Secondary | ICD-10-CM | POA: Diagnosis present

## 2021-01-19 DIAGNOSIS — E876 Hypokalemia: Secondary | ICD-10-CM | POA: Diagnosis present

## 2021-01-19 DIAGNOSIS — I6930 Unspecified sequelae of cerebral infarction: Secondary | ICD-10-CM

## 2021-01-19 LAB — CBC WITH DIFFERENTIAL/PLATELET
Abs Immature Granulocytes: 0.03 10*3/uL (ref 0.00–0.07)
Basophils Absolute: 0 10*3/uL (ref 0.0–0.1)
Basophils Relative: 0 %
Eosinophils Absolute: 0 10*3/uL (ref 0.0–0.5)
Eosinophils Relative: 0 %
HCT: 36.9 % (ref 36.0–46.0)
Hemoglobin: 13 g/dL (ref 12.0–15.0)
Immature Granulocytes: 0 %
Lymphocytes Relative: 13 %
Lymphs Abs: 1.3 10*3/uL (ref 0.7–4.0)
MCH: 27.1 pg (ref 26.0–34.0)
MCHC: 35.2 g/dL (ref 30.0–36.0)
MCV: 77 fL — ABNORMAL LOW (ref 80.0–100.0)
Monocytes Absolute: 0.4 10*3/uL (ref 0.1–1.0)
Monocytes Relative: 4 %
Neutro Abs: 8.1 10*3/uL — ABNORMAL HIGH (ref 1.7–7.7)
Neutrophils Relative %: 83 %
Platelets: 285 10*3/uL (ref 150–400)
RBC: 4.79 MIL/uL (ref 3.87–5.11)
RDW: 14.5 % (ref 11.5–15.5)
WBC: 9.8 10*3/uL (ref 4.0–10.5)
nRBC: 0 % (ref 0.0–0.2)

## 2021-01-19 LAB — COMPREHENSIVE METABOLIC PANEL
ALT: 18 U/L (ref 0–44)
AST: 39 U/L (ref 15–41)
Albumin: 3.1 g/dL — ABNORMAL LOW (ref 3.5–5.0)
Alkaline Phosphatase: 125 U/L (ref 38–126)
Anion gap: 16 — ABNORMAL HIGH (ref 5–15)
BUN: 13 mg/dL (ref 8–23)
CO2: 27 mmol/L (ref 22–32)
Calcium: 8.8 mg/dL — ABNORMAL LOW (ref 8.9–10.3)
Chloride: 92 mmol/L — ABNORMAL LOW (ref 98–111)
Creatinine, Ser: 0.99 mg/dL (ref 0.44–1.00)
GFR, Estimated: 56 mL/min — ABNORMAL LOW (ref >60)
Glucose, Bld: 112 mg/dL — ABNORMAL HIGH (ref 70–99)
Potassium: 3.8 mmol/L (ref 3.5–5.1)
Sodium: 135 mmol/L (ref 135–145)
Total Bilirubin: 1.2 mg/dL (ref 0.3–1.2)
Total Protein: 8.8 g/dL — ABNORMAL HIGH (ref 6.5–8.1)

## 2021-01-19 LAB — LACTIC ACID, PLASMA: Lactic Acid, Venous: 1.9 mmol/L (ref 0.5–1.9)

## 2021-01-19 LAB — TSH: TSH: 3.158 u[IU]/mL (ref 0.350–4.500)

## 2021-01-19 LAB — LIPASE, BLOOD: Lipase: 18 U/L (ref 11–51)

## 2021-01-19 MED ORDER — IOHEXOL 350 MG/ML SOLN
75.0000 mL | Freq: Once | INTRAVENOUS | Status: AC | PRN
  Administered 2021-01-19: 75 mL via INTRAVENOUS

## 2021-01-19 MED ORDER — VANCOMYCIN HCL IN DEXTROSE 1-5 GM/200ML-% IV SOLN
1000.0000 mg | Freq: Once | INTRAVENOUS | Status: AC
  Administered 2021-01-19: 1000 mg via INTRAVENOUS
  Filled 2021-01-19: qty 200

## 2021-01-19 NOTE — ED Provider Notes (Signed)
[facility]-EMERGENCY DEPT Provider Note   CSN: [id number] Arrival date & time: 01/19/21  1649     History Chief Complaint  Patient presents with   Wound Check    Carla Manning is a 86 y.o. female.  The history is provided by the patient and medical records. No language interpreter was used.  Wound Check This is a new problem. The current episode started more than 1 week ago. The problem occurs constantly. The problem has been rapidly worsening. Pertinent negatives include no chest pain, no abdominal pain, no headaches and no shortness of breath. Nothing aggravates the symptoms. Nothing relieves the symptoms. She has tried nothing for the symptoms. The treatment provided no relief.      Past Medical History:  Diagnosis Date   Anxiety    Depression    GERD (gastroesophageal reflux disease)    Hyperlipidemia    Hypertension    Hypothyroid    Seizures (HCC)    post CVA   Stroke [facility])    R sided weakness, aphasia    Patient Active Problem List   Diagnosis Date Noted   History of CVA with residual deficit 07/20/2015   Vitamin B6 deficiency neuropathy 06/25/2014   Occipital neuralgia 06/25/2014   Essential hypertension 06/25/2014   HLD (hyperlipidemia) 06/25/2014   Paresthesia and pain of right extremity 04/09/2013   Hypothyroidism 08/31/2012   Partial seizure (HCC) 08/29/2012   GERD (gastroesophageal reflux disease) 05/30/2011   Cerebrovascular disease, arteriosclerotic, post-stroke 05/13/2010    Past Surgical History:  Procedure Laterality Date   NO PAST SURGERIES       OB History   No obstetric history on file.     Family History  Problem Relation Age of Onset   Diabetes Mother    Hypertension Daughter    Hypertension Daughter    Diabetes Daughter     Social History   Tobacco Use   Smoking status: Never   Smokeless tobacco: Never  Vaping Use   Vaping Use: Never used  Substance Use Topics   Alcohol use: No    Alcohol/week:  0.0 standard drinks   Drug use: No    Home Medications Prior to Admission medications   Medication Sig Start Date End Date Taking? Authorizing Provider  amitriptyline (ELAVIL) 10 MG tablet Take 1 tablet (10 mg total) by mouth at bedtime. 09/01/19   Doristine Bosworth, MD  amLODipine (NORVASC) 10 MG tablet Take 1 tablet (10 mg total) by mouth daily. 09/01/19   Doristine Bosworth, MD  atorvastatin (LIPITOR) 80 MG tablet Take 1 tablet (80 mg total) by mouth daily at 6 PM. 09/01/19   Collie Siad A, MD  carvedilol (COREG) 6.25 MG tablet TAKE 1 TABLET BY MOUTH 2 TIMES DAILY WITH FOOD 11/10/19   Lezlie Lye, Meda Coffee, MD  clopidogrel (PLAVIX) 75 MG tablet Take 1 tablet (75 mg total) by mouth daily. 09/01/19   Doristine Bosworth, MD  famotidine (PEPCID) 20 MG tablet Take 1 tablet (20 mg total) by mouth 2 (two) times daily. 09/01/19   Doristine Bosworth, MD  gabapentin (NEURONTIN) 100 MG capsule Take 1 capsule (100 mg total) by mouth 3 (three) times daily. 12/23/20   Ernie Avena, MD  HYDROcodone-acetaminophen (NORCO/VICODIN) 5-325 MG tablet Take 2 tablets by mouth 2 (two) times daily. 09/01/19   Doristine Bosworth, MD  levETIRAcetam (KEPPRA) 500 MG tablet Take 1 tablet (500 mg total) by mouth every 12 (twelve) hours. 09/01/19   Doristine Bosworth,  MD  levothyroxine (SYNTHROID) 25 MCG tablet Take 1 tablet (25 mcg total) by mouth daily before breakfast. 09/01/19   Doristine Bosworth, MD    Allergies    Patient has no known allergies.  Review of Systems   Review of Systems  Constitutional:  Positive for chills, fatigue and fever (er family).  HENT:  Negative for congestion.   Respiratory:  Positive for cough. Negative for chest tightness, shortness of breath and wheezing.   Cardiovascular:  Negative for chest pain, palpitations and leg swelling.  Gastrointestinal:  Negative for abdominal pain, constipation, diarrhea, nausea and vomiting.  Genitourinary:  Negative for dysuria and flank pain.  Musculoskeletal:  Positive for  back pain. Negative for neck pain and neck stiffness.  Skin:  Positive for wound.  Neurological:  Positive for speech difficulty (at baseline) and weakness (at baseline). Negative for headaches.  Psychiatric/Behavioral:  Positive for agitation and confusion.   All other systems reviewed and are negative.  Physical Exam Updated Vital Signs BP 119/80   Pulse 70   Temp 98 F (36.7 C) (Oral)   Resp 18   LMP 10/06/2016   SpO2 95%   Physical Exam Vitals and nursing note reviewed.  Constitutional:      General: She is not in acute distress.    Appearance: She is well-developed. She is not ill-appearing, toxic-appearing or diaphoretic.  HENT:     Head: Normocephalic and atraumatic.     Mouth/Throat:     Mouth: Mucous membranes are moist.     Pharynx: No oropharyngeal exudate or posterior oropharyngeal erythema.  Eyes:     Conjunctiva/sclera: Conjunctivae normal.     Pupils: Pupils are equal, round, and reactive to light.  Cardiovascular:     Rate and Rhythm: Normal rate and regular rhythm.     Heart sounds: No murmur heard. Pulmonary:     Effort: Pulmonary effort is normal. No respiratory distress.     Breath sounds: Rhonchi present. No wheezing or rales.  Chest:     Chest wall: No tenderness.  Abdominal:     General: Abdomen is flat.     Palpations: Abdomen is soft.     Tenderness: There is no abdominal tenderness. There is no right CVA tenderness, left CVA tenderness, guarding or rebound.  Musculoskeletal:        General: Tenderness present.     Cervical back: Neck supple. No tenderness.       Legs:  Skin:    General: Skin is warm and dry.     Capillary Refill: Capillary refill takes less than 2 seconds.     Findings: Erythema present.  Neurological:     Mental Status: She is alert. Mental status is at baseline.     Sensory: Sensory deficit present.     Motor: Weakness present.     Comments: Weakness in right arm and right leg unchanged from baseline per family from  prior stroke.  Per family patient is acting more fatigued and confused than baseline.     ED Results / Procedures / Treatments   Labs (all labs ordered are listed, but only abnormal results are displayed) Labs Reviewed  COMPREHENSIVE METABOLIC PANEL - Abnormal; Notable for the following components:      Result Value   Chloride 92 (*)    Glucose, Bld 112 (*)    Calcium 8.8 (*)    Total Protein 8.8 (*)    Albumin 3.1 (*)    GFR, Estimated 56 (*)  Anion gap 16 (*)    All other components within normal limits  CBC WITH DIFFERENTIAL/PLATELET - Abnormal; Notable for the following components:   MCV 77.0 (*)    Neutro Abs 8.1 (*)    All other components within normal limits  URINE CULTURE  CULTURE, BLOOD (ROUTINE X 2)  CULTURE, BLOOD (ROUTINE X 2)  RESP PANEL BY RT-PCR (FLU A&B, COVID) ARPGX2  LACTIC ACID, PLASMA  LIPASE, BLOOD  TSH  CBC WITH DIFFERENTIAL/PLATELET  URINALYSIS, ROUTINE W REFLEX MICROSCOPIC    EKG None  Radiology CT ABDOMEN PELVIS W CONTRAST  Result Date: 01/19/2021 CLINICAL DATA:  Abdominal abscess or infection suspected. Patient has a worsening right sacral wound with stool coming from it. EXAM: CT ABDOMEN AND PELVIS WITH CONTRAST TECHNIQUE: Multidetector CT imaging of the abdomen and pelvis was performed using the standard protocol following bolus administration of intravenous contrast. CONTRAST:  31mL OMNIPAQUE IOHEXOL 350 MG/ML SOLN COMPARISON:  None. FINDINGS: Lower chest: Lung bases are clear.  Coronary artery calcifications. Hepatobiliary: The liver is atrophic with nodular contour possibly indicating hepatic cirrhosis. No focal lesions are identified. Gallbladder is distended with layering sludge or stones. No wall thickening or inflammatory changes. No bile duct dilatation. Pancreas: Unremarkable. No pancreatic ductal dilatation or surrounding inflammatory changes. Spleen: Normal in size without focal abnormality. Adrenals/Urinary Tract: No adrenal gland  nodules. Benign-appearing left renal cysts. Scarring and parenchymal atrophy of the right kidney. No hydronephrosis or hydroureter. Bladder is normal. Stomach/Bowel: The stomach, small bowel, and colon are not abnormally distended. Scattered stool in the colon. Scattered colonic diverticula with sigmoid diverticulosis. No wall thickening or inflammatory changes. No perianal fistula or perianal abscess demonstrated. Vascular/Lymphatic: Aortic atherosclerosis. No enlarged abdominal or pelvic lymph nodes. Reproductive: Uterus and bilateral adnexa are unremarkable. Other: No free air or free fluid in the abdomen. Abdominal wall musculature appears intact. Subcutaneous soft tissue edema lateral to the right hip. Right posterior parasacral decubitus ulceration with stranding and gas in the underlying soft tissues. Gas extends focally into the inferior gluteus muscles. No discrete abscess or fistula identified. Underlying bones appear intact without evidence of osteomyelitis. Musculoskeletal: Degenerative changes in the spine. Slight anterior subluxation of L4 on L5 is likely degenerative. No destructive bone lesions. IMPRESSION: 1. Right parasacral decubitus ulceration with stranding and gas in the underlying subcutaneous fatty tissues most consistent with cellulitis. No discrete abscess identified. No CT findings to suggest fistula or osteomyelitis. 2. Probable hepatic cirrhosis without focal lesion. 3. Distended gallbladder with layering sludge or stones. Possible dysmotility. 4. Aortic atherosclerosis. Electronically Signed   By: Burman Nieves M.D.   On: 01/19/2021 21:41   DG Chest Portable 1 View  Result Date: 01/19/2021 CLINICAL DATA:  Chills and fatigue.  Rule out occult pneumonia EXAM: PORTABLE CHEST 1 VIEW COMPARISON:  05/03/2020 FINDINGS: Shallow inspiration. Heart size and pulmonary vascularity are normal. Patient rotation limits evaluation but there is no obvious consolidation or airspace disease in the  lungs. No pleural effusions. No pneumothorax. Mediastinal contours appear intact. Degenerative changes in the spine and shoulders. IMPRESSION: Shallow inspiration.  No evidence of active pulmonary disease. Electronically Signed   By: Burman Nieves M.D.   On: 01/19/2021 19:35    Procedures Procedures   CRITICAL CARE Performed by: Canary Brim Jalaysia Lobb Total critical care time: 30 minutes Critical care time was exclusive of separately billable procedures and treating other patients. Critical care was necessary to treat or prevent imminent or life-threatening deterioration. Critical care was time spent personally by me  on the following activities: development of treatment plan with patient and/or surrogate as well as nursing, discussions with consultants, evaluation of patient's response to treatment, examination of patient, obtaining history from patient or surrogate, ordering and performing treatments and interventions, ordering and review of laboratory studies, ordering and review of radiographic studies, pulse oximetry and re-evaluation of patient's condition.   Medications Ordered in ED Medications  vancomycin (VANCOCIN) IVPB 1000 mg/200 mL premix (has no administration in time range)  iohexol (OMNIPAQUE) 350 MG/ML injection 75 mL (75 mLs Intravenous Contrast Given 01/19/21 2109)    ED Course  I have reviewed the triage vital signs and the nursing notes.  Pertinent labs & imaging results that were available during my care of the patient were reviewed by me and considered in my medical decision making (see chart for details).    MDM Rules/Calculators/A&P                           DERYA DETTMANN is a 86 y.o. female with a past medical history significant for previous stroke with chronic aphasia and right-sided weakness, hypothyroidism, chronic back pain with neuropathies, hypertension, hyperlipidemia, and seizures who presents for worsened back wound as well as subjective fevers,  chills, dry cough, fatigue, and some confusion.  According to daughter, patient has had a back wound for several months but they have been trying to manage it at home as best they can.  They say that over the last few days, that the wound has changed and now has a very foul smell, appears deeper, has surrounding redness, and has more drainage.  They are concerned about serious infection.  Patient is acting more confused and agitated with back pain.  She otherwise has had a dry cough and he said she has had subjective fevers and chills.  They have not reported any nausea or vomiting and denies any urinary changes.  On exam, lungs have some coarseness but chest is nontender.  Abdomen is nontender.  With a chaperone, patient was rolled and her wound was exposed and she does have a concerning appearing four 5 cm in diameter wound that has a foul smell, discharge, surrounding erythema, and what appears to be the necrotic tissue versus stool in the wound.  It is unclear if this tracks deep and has a fistula or if there is deeper abscess.  Patient was tender.  Otherwise, patient does have weakness in the right arm and right leg compared to left side which family reports is at her baseline.  She is aphasic but can occasionally say some words.  Pupils are symmetric and reactive.  Exam otherwise revealed an ill-appearing patient.  Clinically I am concerned about this wound especially as it is worsened over the last several days.  Will get CT scan to look for deep abscess, osteomyelitis, or even a fistulization.  We will get basic labs as well.  We will get chest x-ray given cough.  Given the extent of the wound and he fell smell concerning for serious infection, anticipate she will need IV antibiotics and admission when work-up is completed.  Family also expressed concern that with how the patient is been worsening, they do not feel they can take care of her at home either and so after admission she may need to have  discussions with case management about if she needs placement.  10:19 PM Patient's work-up began to return.  Her lactic acid was not elevated nor was  her white blood cell count however her CT scan does show a significant cellulitis in the sacral area with stranding and subcutaneous gas.  Clinically I do not think this is a necrotizing fasciitis at this time however I do not feel it is safe for discharge home.  No abscess seen.  No fistula seen.  No osteomyelitis seen.  Patient will be given antibiotics and admitted for further management.   Final Clinical Impression(s) / ED Diagnoses Final diagnoses:  Cellulitis of sacral region     Clinical Impression: 1. Cellulitis of sacral region     Disposition: Admit  This note was prepared with assistance of Dragon voice recognition software. Occasional wrong-word or sound-a-like substitutions may have occurred due to the inherent limitations of voice recognition software.     Vondell Sowell, Canary Brim, MD 01/20/21 0005

## 2021-01-19 NOTE — ED Triage Notes (Signed)
Pt BIB EMS from home. Per MS pt was seen at [facility] a week ago for bedsore. Family reports bedsore is larger. Pt is paralyzed from waist down. Family reports bedsore has yellow discharge. Pt is nonverbal with EMS, family reports this is baseline for pt. Pt has hx of stroke.   CBG 90

## 2021-01-19 NOTE — ED Notes (Signed)
Admitting MD was here to see pt 

## 2021-01-19 NOTE — ED Notes (Signed)
Pt has purewick in place will send urine once pt is able to make sample

## 2021-01-19 NOTE — ED Notes (Signed)
Attempt IV placement in [facility] without success, was able to collect labs and first set of blood cultures

## 2021-01-20 ENCOUNTER — Encounter (HOSPITAL_COMMUNITY): Payer: Self-pay | Admitting: Family Medicine

## 2021-01-20 DIAGNOSIS — L89159 Pressure ulcer of sacral region, unspecified stage: Secondary | ICD-10-CM

## 2021-01-20 DIAGNOSIS — F0390 Unspecified dementia without behavioral disturbance: Secondary | ICD-10-CM

## 2021-01-20 LAB — CBC
HCT: 35.2 % — ABNORMAL LOW (ref 36.0–46.0)
Hemoglobin: 12.6 g/dL (ref 12.0–15.0)
MCH: 27.5 pg (ref 26.0–34.0)
MCHC: 35.8 g/dL (ref 30.0–36.0)
MCV: 76.7 fL — ABNORMAL LOW (ref 80.0–100.0)
Platelets: 246 10*3/uL (ref 150–400)
RBC: 4.59 MIL/uL (ref 3.87–5.11)
RDW: 14.6 % (ref 11.5–15.5)
WBC: 8.5 10*3/uL (ref 4.0–10.5)
nRBC: 0 % (ref 0.0–0.2)

## 2021-01-20 LAB — BASIC METABOLIC PANEL
Anion gap: 12 (ref 5–15)
BUN: 13 mg/dL (ref 8–23)
CO2: 28 mmol/L (ref 22–32)
Calcium: 8.6 mg/dL — ABNORMAL LOW (ref 8.9–10.3)
Chloride: 96 mmol/L — ABNORMAL LOW (ref 98–111)
Creatinine, Ser: 0.95 mg/dL (ref 0.44–1.00)
GFR, Estimated: 59 mL/min — ABNORMAL LOW (ref >60)
Glucose, Bld: 103 mg/dL — ABNORMAL HIGH (ref 70–99)
Potassium: 3.3 mmol/L — ABNORMAL LOW (ref 3.5–5.1)
Sodium: 136 mmol/L (ref 135–145)

## 2021-01-20 LAB — RESP PANEL BY RT-PCR (FLU A&B, COVID) ARPGX2
Influenza A by PCR: NEGATIVE
Influenza B by PCR: NEGATIVE
SARS Coronavirus 2 by RT PCR: NEGATIVE

## 2021-01-20 MED ORDER — VANCOMYCIN HCL 1500 MG/300ML IV SOLN: 1500.0000 mg | INTRAVENOUS | Status: DC

## 2021-01-20 MED ORDER — HYDROMORPHONE HCL 1 MG/ML IJ SOLN: 0.5000 mg | INTRAMUSCULAR | Status: DC | PRN

## 2021-01-20 MED ORDER — ONDANSETRON HCL 4 MG PO TABS: 4.0000 mg | ORAL_TABLET | Freq: Four times a day (QID) | ORAL | Status: DC | PRN

## 2021-01-20 MED ORDER — ATORVASTATIN CALCIUM 40 MG PO TABS
80.0000 mg | ORAL_TABLET | Freq: Every day | ORAL | Status: DC
  Administered 2021-01-20: 80 mg via ORAL
  Filled 2021-01-20: qty 2

## 2021-01-20 MED ORDER — GABAPENTIN 100 MG PO CAPS
100.0000 mg | ORAL_CAPSULE | Freq: Three times a day (TID) | ORAL | Status: DC
  Administered 2021-01-20 – 2021-01-25 (×7): 100 mg via ORAL
  Filled 2021-01-20 (×12): qty 1

## 2021-01-20 MED ORDER — LEVETIRACETAM 500 MG PO TABS
500.0000 mg | ORAL_TABLET | Freq: Two times a day (BID) | ORAL | Status: DC
  Administered 2021-01-20 – 2021-01-25 (×7): 500 mg via ORAL
  Filled 2021-01-20 (×13): qty 1

## 2021-01-20 MED ORDER — ADULT MULTIVITAMIN W/MINERALS CH
1.0000 | ORAL_TABLET | Freq: Every day | ORAL | Status: DC
  Administered 2021-01-20: 1 via ORAL
  Filled 2021-01-20 (×5): qty 1

## 2021-01-20 MED ORDER — ONDANSETRON HCL 4 MG/2ML IJ SOLN: 4.0000 mg | Freq: Four times a day (QID) | INTRAMUSCULAR | Status: DC | PRN

## 2021-01-20 MED ORDER — ACETAMINOPHEN 650 MG RE SUPP: 650.0000 mg | Freq: Four times a day (QID) | RECTAL | Status: DC | PRN

## 2021-01-20 MED ORDER — ENSURE SURGERY PO LIQD
237.0000 mL | Freq: Two times a day (BID) | ORAL | Status: DC
  Administered 2021-01-20 – 2021-01-23 (×3): 237 mL via ORAL
  Filled 2021-01-20 (×11): qty 237

## 2021-01-20 MED ORDER — AMITRIPTYLINE HCL 10 MG PO TABS
10.0000 mg | ORAL_TABLET | Freq: Every day | ORAL | Status: DC
  Administered 2021-01-21 – 2021-01-22 (×2): 10 mg via ORAL
  Filled 2021-01-20 (×5): qty 1

## 2021-01-20 MED ORDER — ASCORBIC ACID 500 MG PO TABS
250.0000 mg | ORAL_TABLET | Freq: Two times a day (BID) | ORAL | Status: DC
  Administered 2021-01-20 – 2021-01-22 (×4): 250 mg via ORAL
  Filled 2021-01-20 (×9): qty 1

## 2021-01-20 MED ORDER — LACTATED RINGERS IV SOLN: INTRAVENOUS | Status: AC

## 2021-01-20 MED ORDER — SENNOSIDES-DOCUSATE SODIUM 8.6-50 MG PO TABS: 1.0000 | ORAL_TABLET | Freq: Every evening | ORAL | Status: DC | PRN

## 2021-01-20 MED ORDER — LEVOTHYROXINE SODIUM 25 MCG PO TABS
25.0000 ug | ORAL_TABLET | Freq: Every day | ORAL | Status: DC
  Administered 2021-01-20 – 2021-01-25 (×4): 25 ug via ORAL
  Filled 2021-01-20 (×4): qty 1

## 2021-01-20 MED ORDER — CLOPIDOGREL BISULFATE 75 MG PO TABS
75.0000 mg | ORAL_TABLET | Freq: Every day | ORAL | Status: DC
  Administered 2021-01-20 – 2021-01-25 (×5): 75 mg via ORAL
  Filled 2021-01-20 (×8): qty 1

## 2021-01-20 MED ORDER — POTASSIUM CHLORIDE CRYS ER 20 MEQ PO TBCR
40.0000 meq | EXTENDED_RELEASE_TABLET | Freq: Once | ORAL | Status: AC
  Administered 2021-01-20: 40 meq via ORAL
  Filled 2021-01-20: qty 2

## 2021-01-20 MED ORDER — AMOXICILLIN-POT CLAVULANATE 875-125 MG PO TABS
1.0000 | ORAL_TABLET | Freq: Two times a day (BID) | ORAL | Status: DC
  Administered 2021-01-21 – 2021-01-25 (×6): 1 via ORAL
  Filled 2021-01-20 (×10): qty 1

## 2021-01-20 MED ORDER — COLLAGENASE 250 UNIT/GM EX OINT
TOPICAL_OINTMENT | Freq: Every day | CUTANEOUS | Status: DC
  Filled 2021-01-20: qty 30

## 2021-01-20 MED ORDER — ENOXAPARIN SODIUM 40 MG/0.4ML IJ SOSY
40.0000 mg | PREFILLED_SYRINGE | INTRAMUSCULAR | Status: DC
  Administered 2021-01-20 – 2021-01-23 (×3): 40 mg via SUBCUTANEOUS
  Filled 2021-01-20 (×5): qty 0.4

## 2021-01-20 MED ORDER — CARVEDILOL 6.25 MG PO TABS
6.2500 mg | ORAL_TABLET | Freq: Two times a day (BID) | ORAL | Status: DC
  Administered 2021-01-20 – 2021-01-25 (×7): 6.25 mg via ORAL
  Filled 2021-01-20 (×8): qty 1

## 2021-01-20 MED ORDER — AMLODIPINE BESYLATE 10 MG PO TABS
10.0000 mg | ORAL_TABLET | Freq: Every day | ORAL | Status: DC
  Administered 2021-01-20 – 2021-01-25 (×5): 10 mg via ORAL
  Filled 2021-01-20 (×8): qty 1

## 2021-01-20 MED ORDER — ACETAMINOPHEN 325 MG PO TABS: 650.0000 mg | ORAL_TABLET | Freq: Four times a day (QID) | ORAL | Status: DC | PRN

## 2021-01-20 MED ORDER — SODIUM CHLORIDE 0.9 % IV SOLN
2.0000 g | Freq: Two times a day (BID) | INTRAVENOUS | Status: DC
  Administered 2021-01-20: 2 g via INTRAVENOUS
  Filled 2021-01-20: qty 2

## 2021-01-20 MED ORDER — VANCOMYCIN HCL 750 MG/150ML IV SOLN: 750.0000 mg | INTRAVENOUS | Status: DC

## 2021-01-20 MED ORDER — INFLUENZA VAC A&B SA ADJ QUAD 0.5 ML IM PRSY
0.5000 mL | PREFILLED_SYRINGE | INTRAMUSCULAR | Status: DC
  Filled 2021-01-20: qty 0.5

## 2021-01-20 NOTE — Progress Notes (Signed)
PROGRESS NOTE    Carla Manning  ZOX:[id number] DOB: [birth year] DOA: 01/19/2021 PCP: Pcp, No   Chief Complain: Pressure ulcer  Brief Narrative: Patient is a 86 year old female with history of stroke x2, bedbound, right hemiparesis, advanced dementia, hypertension, seizure disorder who was brought out to the hospital by her daughter because she could not take care of her anymore and she has noticed a pressure ulcer on her right sacral area.  Patient is nonambulatory and has been bedbound.  Daughter noticed the ulcer about a month ago.  No report of fever, chills at home.  On presentation she was hemodynamically stable.  She was found to have large sacral decubitus ulcer on her right buttock about 4 to 5 cm in diameter with foul-smelling discharge.  CT scan revealed gas in the region consistent with cellulitis.  Wound care was consulted who recommended general surgery consultation for possible debridement.  Currently on broad-spectrum antibiotics.  TOC also consulted for placement assistance  Assessment & Plan:   Principal Problem:   Decubitus ulcer of sacral area Active Problems:   Hypothyroidism   Essential hypertension   History of CVA with residual deficit   Dementia (HCC)   Infected  right sacral decubitus ulcer: Found to have a sacral decubitus ulcer on the upper right buttock.  Wound care consulted.  Size of about 4X5X 2 cm.  Wound care recommended consideration of general consult for possible debridement at bedside.  Wound is foul-smelling. Blood cultures have been sent.  Started on vancomycin, cefepime. CT showed right parasacral decubitus ulceration with stranding and gas in the underlying subcutaneous fatty tissues , consistent with cellulitis. No discrete abscess identified. No CT findings to suggest fistula or osteomyelitis. General surgery consulted today.  Hypertension: Continue current medications.  Monitor blood pressure  Hypothyroidism: Continue  levothyroxine  Hyperlipidemia: On statin  Dementia: Confused at baseline.  Unable to ambulate.  Feeds by herself though.  History of CVA: Has chronic right hemiplegia.  Bedbound.  On Plavix.  History of seizure disorder: On Keppra  Hypokalemia: Supplement with potassium today  Disposition: Lives with daughter.  She is unable to take care of her mom anymore.  She wants her to be placed in a facility          DVT prophylaxis:Lovenox Code Status: Full Family Communication: Daughter at bedside Status is: Inpatient  Remains inpatient appropriate because:IV treatments appropriate due to intensity of illness or inability to take PO  Dispo: The patient is from: Home              Anticipated d/c is to: SNF              Patient currently is not medically stable to d/c.   Difficult to place patient Yes      Consultants: Surgery  Procedures: None  Antimicrobials:  Anti-infectives (From admission, onward)    Start     Dose/Rate Route Frequency Ordered Stop   01/21/21 2000  vancomycin (VANCOREADY) IVPB 1500 mg/300 mL  Status:  Discontinued        1,500 mg 150 mL/hr over 120 Minutes Intravenous Every 48 hours 01/20/21 0126 01/20/21 0150   01/21/21 1200  vancomycin (VANCOREADY) IVPB 750 mg/150 mL        750 mg 150 mL/hr over 60 Minutes Intravenous Every 36 hours 01/20/21 0150     01/19/21 2230  vancomycin (VANCOCIN) IVPB 1000 mg/200 mL premix        1,000 mg 200 mL/hr over 60 Minutes Intravenous  Once 01/19/21 2218 01/20/21 0021       Subjective:  Patient seen and examined the bedside this morning.  Daughter at the bedside.  Patient is nonverbal, bedbound.  Did not look uncomfortable.  Foul-smelling wound noted on the sacrum  Objective: Vitals:   01/20/21 0115 01/20/21 0133 01/20/21 0418 01/20/21 0816  BP:  (!) 115/58 112/65 (!) 98/52  Pulse: 80 85 81 76  Resp: 17 18 18    Temp:  98 F (36.7 C) 97.7 F (36.5 C) (!) 97.4 F (36.3 C)  TempSrc:  Axillary Axillary  Oral  SpO2: 96% 98% 98% 96%  Weight:  62.7 kg      Intake/Output Summary (Last 24 hours) at 01/20/2021 1147 Last data filed at 01/20/2021 0600 Gross per 24 hour  Intake 0.78 ml  Output --  Net 0.78 ml   Filed Weights   01/20/21 0133  Weight: 62.7 kg    Examination:  General exam: Overall comfortable, not in distress, very deconditioned HEENT: PERRL Respiratory system:  no wheezes or crackles  Cardiovascular system: S1 & S2 heard, RRR.  Gastrointestinal system: Abdomen is nondistended, soft and nontender. Central nervous system: Alert and awake but not oriented Extremities: No edema, no clubbing ,no cyanosis Skin: Sacral decubitus ulcer    Data Reviewed: I have personally reviewed following labs and imaging studies  CBC: Recent Labs  Lab 01/19/21 2100 01/20/21 0522  WBC 9.8 8.5  NEUTROABS 8.1*  --   HGB 13.0 12.6  HCT 36.9 35.2*  MCV 77.0* 76.7*  PLT 285 246   Basic Metabolic Panel: Recent Labs  Lab 01/19/21 1928 01/20/21 0522  NA 135 136  K 3.8 3.3*  CL 92* 96*  CO2 27 28  GLUCOSE 112* 103*  BUN 13 13  CREATININE 0.99 0.95  CALCIUM 8.8* 8.6*   GFR: Estimated Creatinine Clearance: 35.5 mL/min (by C-G formula based on SCr of 0.95 mg/dL). Liver Function Tests: Recent Labs  Lab 01/19/21 1928  AST 39  ALT 18  ALKPHOS 125  BILITOT 1.2  PROT 8.8*  ALBUMIN 3.1*   Recent Labs  Lab 01/19/21 1928  LIPASE 18   No results for input(s): AMMONIA in the last 168 hours. Coagulation Profile: No results for input(s): INR, PROTIME in the last 168 hours. Cardiac Enzymes: No results for input(s): CKTOTAL, CKMB, CKMBINDEX, TROPONINI in the last 168 hours. BNP (last 3 results) No results for input(s): PROBNP in the last 8760 hours. HbA1C: No results for input(s): HGBA1C in the last 72 hours. CBG: No results for input(s): GLUCAP in the last 168 hours. Lipid Profile: No results for input(s): CHOL, HDL, LDLCALC, TRIG, CHOLHDL, LDLDIRECT in the last 72  hours. Thyroid Function Tests: Recent Labs    01/19/21 1928  TSH 3.158   Anemia Panel: No results for input(s): VITAMINB12, FOLATE, FERRITIN, TIBC, IRON, RETICCTPCT in the last 72 hours. Sepsis Labs: Recent Labs  Lab 01/19/21 1928  LATICACIDVEN 1.9    Recent Results (from the past 240 hour(s))  Blood culture (routine x 2)     Status: None (Preliminary result)   Collection Time: 01/19/21  6:58 PM   Specimen: BLOOD LEFT FOREARM  Result Value Ref Range Status   Specimen Description   Final    BLOOD LEFT FOREARM Performed at [facility], [address] [place], [place]    Special Requests   Final    BOTTLES DRAWN AEROBIC ONLY Blood Culture results may not be optimal due to an inadequate volume  of blood received in culture bottles Performed at [facility], [address], [place], [place] [address]    Culture   Final    NO GROWTH < 12 HOURS Performed at [facility], [address], [place], [place] [address]    Report Status PENDING  Incomplete  Blood culture (routine x 2)     Status: None (Preliminary result)   Collection Time: 01/19/21  7:28 PM   Specimen: BLOOD  Result Value Ref Range Status   Specimen Description   Final    BLOOD LEFT ANTECUBITAL Performed at [facility], [address] [place], [place] [address]    Special Requests   Final    BOTTLES DRAWN AEROBIC AND ANAEROBIC Blood Culture results may not be optimal due to an inadequate volume of blood received in culture bottles Performed at [facility], [address], [place], [place] [address]    Culture   Final    NO GROWTH < 12 HOURS Performed at [facility], [address] [place], [place] [address]    Report Status PENDING  Incomplete  Resp Panel by RT-PCR (Flu A&B, Covid) Nasopharyngeal Swab     Status: None   Collection Time: 01/19/21 11:22 PM   Specimen: Nasopharyngeal Swab; Nasopharyngeal(NP)  swabs in vial transport medium  Result Value Ref Range Status   SARS Coronavirus 2 by RT PCR NEGATIVE NEGATIVE Final    Comment: (NOTE) SARS-CoV-2 target nucleic acids are NOT DETECTED.  The SARS-CoV-2 RNA is generally detectable in upper respiratory specimens during the acute phase of infection. The lowest concentration of SARS-CoV-2 viral copies this assay can detect is 138 copies/mL. A negative result does not preclude SARS-Cov-2 infection and should not be used as the sole basis for treatment or other patient management decisions. A negative result may occur with  improper specimen collection/handling, submission of specimen other than nasopharyngeal swab, presence of viral mutation(s) within the areas targeted by this assay, and inadequate number of viral copies(<138 copies/mL). A negative result must be combined with clinical observations, patient history, and epidemiological information. The expected result is Negative.  Fact Sheet for Patients:  BloggerCourse.com  Fact Sheet for Healthcare Providers:  SeriousBroker.it  This test is no t yet approved or cleared by the [place] FDA and  has been authorized for detection and/or diagnosis of SARS-CoV-2 by FDA under an Emergency Use Authorization (EUA). This EUA will remain  in effect (meaning this test can be used) for the duration of the COVID-19 declaration under Section 564(b)(1) of the Act, 21 U.S.C.section 360bbb-3(b)(1), unless the authorization is terminated  or revoked sooner.       Influenza A by PCR NEGATIVE NEGATIVE Final   Influenza B by PCR NEGATIVE NEGATIVE Final    Comment: (NOTE) The Xpert Xpress SARS-CoV-2/FLU/RSV plus assay is intended as an aid in the diagnosis of influenza from Nasopharyngeal swab specimens and should not be used as a sole basis for treatment. Nasal washings and aspirates are unacceptable for Xpert Xpress  SARS-CoV-2/FLU/RSV testing.  Fact Sheet for Patients: BloggerCourse.com  Fact Sheet for Healthcare Providers: SeriousBroker.it  This test is not yet approved or cleared by the [place] FDA and has been authorized for detection and/or diagnosis of SARS-CoV-2 by FDA under an Emergency Use Authorization (EUA). This EUA will remain in effect (meaning this test can be used) for the duration of the COVID-19 declaration under Section 564(b)(1) of the Act, 21 U.S.C. section 360bbb-3(b)(1),  unless the authorization is terminated or revoked.  Performed at [facility], [address], [place], [place] [address]          Radiology Studies: CT ABDOMEN PELVIS W CONTRAST  Result Date: 01/19/2021 CLINICAL DATA:  Abdominal abscess or infection suspected. Patient has a worsening right sacral wound with stool coming from it. EXAM: CT ABDOMEN AND PELVIS WITH CONTRAST TECHNIQUE: Multidetector CT imaging of the abdomen and pelvis was performed using the standard protocol following bolus administration of intravenous contrast. CONTRAST:  8mL OMNIPAQUE IOHEXOL 350 MG/ML SOLN COMPARISON:  None. FINDINGS: Lower chest: Lung bases are clear.  Coronary artery calcifications. Hepatobiliary: The liver is atrophic with nodular contour possibly indicating hepatic cirrhosis. No focal lesions are identified. Gallbladder is distended with layering sludge or stones. No wall thickening or inflammatory changes. No bile duct dilatation. Pancreas: Unremarkable. No pancreatic ductal dilatation or surrounding inflammatory changes. Spleen: Normal in size without focal abnormality. Adrenals/Urinary Tract: No adrenal gland nodules. Benign-appearing left renal cysts. Scarring and parenchymal atrophy of the right kidney. No hydronephrosis or hydroureter. Bladder is normal. Stomach/Bowel: The stomach, small bowel, and colon are not abnormally distended.  Scattered stool in the colon. Scattered colonic diverticula with sigmoid diverticulosis. No wall thickening or inflammatory changes. No perianal fistula or perianal abscess demonstrated. Vascular/Lymphatic: Aortic atherosclerosis. No enlarged abdominal or pelvic lymph nodes. Reproductive: Uterus and bilateral adnexa are unremarkable. Other: No free air or free fluid in the abdomen. Abdominal wall musculature appears intact. Subcutaneous soft tissue edema lateral to the right hip. Right posterior parasacral decubitus ulceration with stranding and gas in the underlying soft tissues. Gas extends focally into the inferior gluteus muscles. No discrete abscess or fistula identified. Underlying bones appear intact without evidence of osteomyelitis. Musculoskeletal: Degenerative changes in the spine. Slight anterior subluxation of L4 on L5 is likely degenerative. No destructive bone lesions. IMPRESSION: 1. Right parasacral decubitus ulceration with stranding and gas in the underlying subcutaneous fatty tissues most consistent with cellulitis. No discrete abscess identified. No CT findings to suggest fistula or osteomyelitis. 2. Probable hepatic cirrhosis without focal lesion. 3. Distended gallbladder with layering sludge or stones. Possible dysmotility. 4. Aortic atherosclerosis. Electronically Signed   By: Burman Nieves M.D.   On: 01/19/2021 21:41   DG Chest Portable 1 View  Result Date: 01/19/2021 CLINICAL DATA:  Chills and fatigue.  Rule out occult pneumonia EXAM: PORTABLE CHEST 1 VIEW COMPARISON:  05/03/2020 FINDINGS: Shallow inspiration. Heart size and pulmonary vascularity are normal. Patient rotation limits evaluation but there is no obvious consolidation or airspace disease in the lungs. No pleural effusions. No pneumothorax. Mediastinal contours appear intact. Degenerative changes in the spine and shoulders. IMPRESSION: Shallow inspiration.  No evidence of active pulmonary disease. Electronically Signed    By: Burman Nieves M.D.   On: 01/19/2021 19:35        Scheduled Meds:  amitriptyline  10 mg Oral QHS   amLODipine  10 mg Oral Daily   atorvastatin  80 mg Oral q1800   carvedilol  6.25 mg Oral BID WC   clopidogrel  75 mg Oral Daily   collagenase   Topical Daily   enoxaparin (LOVENOX) injection  40 mg Subcutaneous Q24H   gabapentin  100 mg Oral TID   [START ON 01/21/2021] influenza vaccine adjuvanted  0.5 mL Intramuscular Tomorrow-1000   levETIRAcetam  500 mg Oral Q12H   levothyroxine  25 mcg Oral Q0600   potassium chloride  40 mEq Oral Once   Continuous Infusions:  lactated ringers 75 mL/hr at 01/20/21 0211   [START ON 01/21/2021] vancomycin       LOS: 1 day    Time spent: More than 50% of that time was spent in counseling and/or coordination of care.      Burnadette Pop, MD [facility] P9/22/2022, 11:47 AM

## 2021-01-20 NOTE — TOC Initial Note (Signed)
Transition of Care [facility]) - Initial/Assessment Note    Patient Details  Name: Carla Manning MRN: [id number] Date of Birth: 09-06-1934  [facility] CM/SW Contact:    Ida Rogue, LCSW Phone Number: 01/20/2021, 4:04 PM  Clinical Narrative:   Patient seen in follow up to OT recommendation of LTC/unrehabable.  Carla Manning was asleep during my visit and did not awaken.  I spoke with her 2 daughters who have been caring for her.  When I explained I could not get their mother into short term rehab from here, paving the way for LTC, they told me they had applied for MCD and think that perhaps all that is needed is an FL2.  I followed up on that, while asking that they check with secondary insurance about LTC option.  DSS reported that patient was denied MCD in Feb of 22, and family would need to re-apply.  In meantime, family found out she has LTC as part of insurance, and will get additional information as to when it would start, if there are only certain places in-network, etc.  I made sure they understood that they will have to take their mother home if LTC does not start immediately. TOC will continue to follow during the course of hospitalization.               Expected Discharge Plan: Home w Home Health Services Barriers to Discharge: No Barriers Identified   Patient Goals and CMS Choice     Choice offered to / list presented to : Adult Children  Expected Discharge Plan and Services Expected Discharge Plan: Home w Home Health Services   Discharge Planning Services: CM Consult Post Acute Care Choice: Home Health Living arrangements for the past 2 months: Single Family Home                                      Prior Living Arrangements/Services Living arrangements for the past 2 months: Single Family Home Lives with:: Adult Children Patient language and need for interpreter reviewed:: Yes        Need for Family Participation in Patient Care: Yes (Comment) Care  giver support system in place?: Yes (comment) Current home services: DME Criminal Activity/Legal Involvement Pertinent to Current Situation/Hospitalization: No - Comment as needed  Activities of Daily Living Home Assistive Devices/Equipment: Wheelchair, Dentures (specify type) (upper/lower dentures) ADL Screening (condition at time of admission) Patient's cognitive ability adequate to safely complete daily activities?: No Is the patient deaf or have difficulty hearing?: Yes Does the patient have difficulty seeing, even when wearing glasses/contacts?: No Does the patient have difficulty concentrating, remembering, or making decisions?: Yes Patient able to express need for assistance with ADLs?: No Does the patient have difficulty dressing or bathing?: Yes Independently performs ADLs?: No (patient is totally dependent and bedbound) Communication: Independent (patient talks very little) Dressing (OT): Dependent Is this a change from baseline?: Pre-admission baseline Grooming: Dependent Is this a change from baseline?: Pre-admission baseline Feeding: Dependent Is this a change from baseline?: Pre-admission baseline Bathing: Dependent Is this a change from baseline?: Pre-admission baseline Toileting: Dependent (patient bedbound) Is this a change from baseline?: Pre-admission baseline In/Out Bed: Dependent Is this a change from baseline?: Pre-admission baseline Walks in Home: Dependent Is this a change from baseline?: Pre-admission baseline Does the patient have difficulty walking or climbing stairs?: Yes (secondary to weakness) Weakness of Legs: Both Weakness  of Arms/Hands: Both  Permission Sought/Granted Permission sought to share information with : Family Supports Permission granted to share information with : No  Share Information with NAME: Carla Manning (Daughter)   [phone]           Emotional Assessment Appearance:: Appears stated age Attitude/Demeanor/Rapport:  Unable to Assess Affect (typically observed): Unable to Assess Orientation: : Oriented to Self Alcohol / Substance Use: Not Applicable Psych Involvement: No (comment)  Admission diagnosis:  Decubitus ulcer of sacral area [L89.159] Cellulitis of sacral region [L03.319] Patient Active Problem List   Diagnosis Date Noted   Dementia (HCC) 01/20/2021   Decubitus ulcer of sacral area 01/19/2021   History of CVA with residual deficit 07/20/2015   Vitamin B6 deficiency neuropathy 06/25/2014   Occipital neuralgia 06/25/2014   Essential hypertension 06/25/2014   HLD (hyperlipidemia) 06/25/2014   Paresthesia and pain of right extremity 04/09/2013   Hypothyroidism 08/31/2012   Partial seizure (HCC) 08/29/2012   GERD (gastroesophageal reflux disease) 05/30/2011   Cerebrovascular disease, arteriosclerotic, post-stroke 05/13/2010   PCP:  Pcp, No Pharmacy:   [facility] [place], [place] - [address] [place] [address] Phone: [phone] Fax: [phone]     Social Determinants of Health (SDOH) Interventions    Readmission Risk Interventions No flowsheet data found.

## 2021-01-20 NOTE — Consult Note (Addendum)
WOC Nurse Consult Note: Reason for Consult: Consult requested for right buttock wound Wound type: Chronic Unstageable pressure injury; 100% loose black eschar with strong foul odor, mod amt tan drainage; 4X5X2cm with 1 cm undermining.  Pressure Injury POA: Yes Dressing procedure/placement/frequency: If aggressive plan of care is determined, then pt could benefit from a surgical consult for possible bedside debridement. Secure chat message sent to the primary team to inform them of this if desired.  Topical treatment orders provided for bedside nurses to perform as follows to assist with enzymatic debridement: Apply Santyl to right buttock wound Q day, then cover with moist gauze packing and foam dressing. (Change foam dressing Q 3 days or PRN soiling.) Please re-consult if further assistance is needed.  Thank-you,  Cammie Mcgee MSN, RN, CWOCN, [place], CNS [phone]

## 2021-01-20 NOTE — H&P (Signed)
History and Physical    TENNILLE Manning Carla Manning DOB: 22-May-1934 DOA: 01/19/2021  PCP: Pcp, No   Patient coming from: Home  Chief Complaint: sore on buttocks  HPI: Carla Manning is a 86 y.o. female with medical history significant for Seizure disorder, HTN, hx of strokes, Dementia who presents for evaluation of ulcer on her buttocks.  According to her daughter this presents her sister noticed the ulcer the last few days but states that it appeared much worse today and had some drainage so she was brought to the hospital for evaluation.  Patient is bedbound.  She lives with 1 daughter and the other daughter comes over daily to help with her care.  She has to be carried to the bathroom and she does not ambulate.  She has paralysis of her right arm and leg since a stroke a few years ago.  She has garbled speech that has not always coherent which is normal per the daughter.  Daughter reports has been no fever, shortness of breath, cough, chest pain, nausea vomiting or diarrhea.   ED Course: Carla Manning is hemodynamically stable.  She has a large sacral decubitus ulcer that is 4 to 5 cm in diameter with foul-smelling discharge.  CT scan reveals mild gas in the region consistent with a cellulitis.  CBC is normal.  CMP is unremarkable.  Hospitalist service been asked to admit for further management  Review of Systems:  Unable to obtain review of systems secondary to dementia  Past Medical History:  Diagnosis Date   Anxiety    Depression    GERD (gastroesophageal reflux disease)    Hyperlipidemia    Hypertension    Hypothyroid    Seizures (HCC)    post CVA   Stroke (HCC)    R sided weakness, aphasia    Past Surgical History:  Procedure Laterality Date   NO PAST SURGERIES      Social History  reports that she has never smoked. She has never used smokeless tobacco. She reports that she does not drink alcohol and does not use drugs.  No Known Allergies  Family History  Problem  Relation Age of Onset   Diabetes Mother    Hypertension Daughter    Hypertension Daughter    Diabetes Daughter      Prior to Admission medications   Medication Sig Start Date End Date Taking? Authorizing Provider  amitriptyline (ELAVIL) 10 MG tablet Take 1 tablet (10 mg total) by mouth at bedtime. 09/01/19  Yes Stallings, Zoe A, MD  amLODipine (NORVASC) 10 MG tablet Take 1 tablet (10 mg total) by mouth daily. 09/01/19  Yes Doristine Bosworth, MD  atorvastatin (LIPITOR) 80 MG tablet Take 1 tablet (80 mg total) by mouth daily at 6 PM. 09/01/19  Yes Stallings, Zoe A, MD  carvedilol (COREG) 6.25 MG tablet TAKE 1 TABLET BY MOUTH 2 TIMES DAILY WITH FOOD Patient taking differently: Take 6.25 mg by mouth 2 (two) times daily with a meal. 11/10/19  Yes Lezlie Lye, Meda Coffee, MD  clopidogrel (PLAVIX) 75 MG tablet Take 1 tablet (75 mg total) by mouth daily. 09/01/19  Yes Doristine Bosworth, MD  famotidine (PEPCID) 20 MG tablet Take 1 tablet (20 mg total) by mouth 2 (two) times daily. Patient taking differently: Take 20 mg by mouth daily. 09/01/19  Yes Collie Siad A, MD  gabapentin (NEURONTIN) 100 MG capsule Take 1 capsule (100 mg total) by mouth 3 (three) times daily. 12/23/20  Yes Ernie Avena,  MD  HYDROcodone-acetaminophen (NORCO/VICODIN) 5-325 MG tablet Take 2 tablets by mouth 2 (two) times daily. 09/01/19  Yes Doristine Bosworth, MD  levETIRAcetam (KEPPRA) 500 MG tablet Take 1 tablet (500 mg total) by mouth every 12 (twelve) hours. 09/01/19  Yes Doristine Bosworth, MD  levothyroxine (SYNTHROID) 25 MCG tablet Take 1 tablet (25 mcg total) by mouth daily before breakfast. 09/01/19  Yes Doristine Bosworth, MD    Physical Exam: Vitals:   01/19/21 2300 01/20/21 0000 01/20/21 0023 01/20/21 0115  BP: 113/73 106/75 103/64   Pulse: 83 81 82 80  Resp: 16 18 16 17   Temp:   97.8 F (36.6 C)   TempSrc:   Oral   SpO2: 97% 99% 96% 96%    Constitutional: NAD, calm Vitals:   01/19/21 2300 01/20/21 0000 01/20/21 0023 01/20/21  0115  BP: 113/73 106/75 103/64   Pulse: 83 81 82 80  Resp: 16 18 16 17   Temp:   97.8 F (36.6 C)   TempSrc:   Oral   SpO2: 97% 99% 96% 96%   General: Elderly frail lady. Alert  Eyes: EOMI, PERRL, conjunctivae normal.  Sclera nonicteric HENT:  [place]/AT, external ears normal.  Nares patent without epistasis.  Mucous membranes are moist Neck: Soft, normal range of motion, supple, no masses, Trachea midline Respiratory: clear to auscultation bilaterally, no wheezing, no crackles. Normal respiratory effort. No accessory muscle use.  Cardiovascular: Regular rate and rhythm, no murmurs / rubs / gallops. Trace lower extremity edema.  Abdomen: Soft, no tenderness, nondistended, no rebound or guarding.  No masses palpated. Bowel sounds normoactive Musculoskeletal:  no cyanosis. No joint deformity upper and lower extremities. Has contracture  right arm. Normal muscle tone.  Skin: Warm, dry, intact no rashes, lesions, ulcers. No induration Neurologic:  Garbled speech which daughter states is her baseline. Opens eyes to voice.   Labs on Admission: I have personally reviewed following labs and imaging studies  CBC: Recent Labs  Lab 01/19/21 2100  WBC 9.8  NEUTROABS 8.1*  HGB 13.0  HCT 36.9  MCV 77.0*  PLT 285    Basic Metabolic Panel: Recent Labs  Lab 01/19/21 1928  NA 135  K 3.8  CL 92*  CO2 27  GLUCOSE 112*  BUN 13  CREATININE 0.99  CALCIUM 8.8*    GFR: CrCl cannot be calculated (Unknown ideal weight.).  Liver Function Tests: Recent Labs  Lab 01/19/21 1928  AST 39  ALT 18  ALKPHOS 125  BILITOT 1.2  PROT 8.8*  ALBUMIN 3.1*    Urine analysis:    Component Value Date/Time   COLORURINE YELLOW 05/03/2020 1934   APPEARANCEUR CLEAR 05/03/2020 1934   LABSPEC 1.015 05/03/2020 1934   PHURINE 7.0 05/03/2020 1934   GLUCOSEU NEGATIVE 05/03/2020 1934   HGBUR TRACE (A) 05/03/2020 1934   BILIRUBINUR NEGATIVE 05/03/2020 1934   KETONESUR NEGATIVE 05/03/2020 1934   PROTEINUR  NEGATIVE 05/03/2020 1934   UROBILINOGEN 0.2 02/10/2014 0900   NITRITE NEGATIVE 05/03/2020 1934   LEUKOCYTESUR SMALL (A) 05/03/2020 1934    Radiological Exams on Admission: CT ABDOMEN PELVIS W CONTRAST  Result Date: 01/19/2021 CLINICAL DATA:  Abdominal abscess or infection suspected. Patient has a worsening right sacral wound with stool coming from it. EXAM: CT ABDOMEN AND PELVIS WITH CONTRAST TECHNIQUE: Multidetector CT imaging of the abdomen and pelvis was performed using the standard protocol following bolus administration of intravenous contrast. CONTRAST:  36mL OMNIPAQUE IOHEXOL 350 MG/ML SOLN COMPARISON:  None. FINDINGS: Lower chest: Lung  bases are clear.  Coronary artery calcifications. Hepatobiliary: The liver is atrophic with nodular contour possibly indicating hepatic cirrhosis. No focal lesions are identified. Gallbladder is distended with layering sludge or stones. No wall thickening or inflammatory changes. No bile duct dilatation. Pancreas: Unremarkable. No pancreatic ductal dilatation or surrounding inflammatory changes. Spleen: Normal in size without focal abnormality. Adrenals/Urinary Tract: No adrenal gland nodules. Benign-appearing left renal cysts. Scarring and parenchymal atrophy of the right kidney. No hydronephrosis or hydroureter. Bladder is normal. Stomach/Bowel: The stomach, small bowel, and colon are not abnormally distended. Scattered stool in the colon. Scattered colonic diverticula with sigmoid diverticulosis. No wall thickening or inflammatory changes. No perianal fistula or perianal abscess demonstrated. Vascular/Lymphatic: Aortic atherosclerosis. No enlarged abdominal or pelvic lymph nodes. Reproductive: Uterus and bilateral adnexa are unremarkable. Other: No free air or free fluid in the abdomen. Abdominal wall musculature appears intact. Subcutaneous soft tissue edema lateral to the right hip. Right posterior parasacral decubitus ulceration with stranding and gas in the  underlying soft tissues. Gas extends focally into the inferior gluteus muscles. No discrete abscess or fistula identified. Underlying bones appear intact without evidence of osteomyelitis. Musculoskeletal: Degenerative changes in the spine. Slight anterior subluxation of L4 on L5 is likely degenerative. No destructive bone lesions. IMPRESSION: 1. Right parasacral decubitus ulceration with stranding and gas in the underlying subcutaneous fatty tissues most consistent with cellulitis. No discrete abscess identified. No CT findings to suggest fistula or osteomyelitis. 2. Probable hepatic cirrhosis without focal lesion. 3. Distended gallbladder with layering sludge or stones. Possible dysmotility. 4. Aortic atherosclerosis. Electronically Signed   By: Burman Nieves M.D.   On: 01/19/2021 21:41   DG Chest Portable 1 View  Result Date: 01/19/2021 CLINICAL DATA:  Chills and fatigue.  Rule out occult pneumonia EXAM: PORTABLE CHEST 1 VIEW COMPARISON:  05/03/2020 FINDINGS: Shallow inspiration. Heart size and pulmonary vascularity are normal. Patient rotation limits evaluation but there is no obvious consolidation or airspace disease in the lungs. No pleural effusions. No pneumothorax. Mediastinal contours appear intact. Degenerative changes in the spine and shoulders. IMPRESSION: Shallow inspiration.  No evidence of active pulmonary disease. Electronically Signed   By: Burman Nieves M.D.   On: 01/19/2021 19:35     Assessment/Plan Principal Problem:   Decubitus ulcer of sacral area with cellulitis Carla Manning is admitted to Med Surg floor.  Started on antibiotic coverage with vancomycin Consult wound care in morning to evaluate sacral decubitus ulcer  Active Problems:   Essential hypertension Home medications.  Monitor blood pressure    Hypothyroidism Continue levothyroxine    Dementia  Chronic and stable per daughter    History of CVA with residual deficit Patient has paralysis of right arm and  leg and is down to at home and requires full care.  The daughter she lives with is no longer able to care for her and they have tried to obtain SNF placement from home over the last few weeks but have not been able to   DVT prophylaxis: Lovenox for DVT prophylaxis  Code Status:   Full Code  Family Communication:  Diagnosis and plan discussed with daughter who is at the bedside.  She verbalized understanding and agrees with plan.  Further recommendations to follow as clinical indicated Disposition Plan:   Patient is from:  Home  Anticipated DC to:  Rehab  Anticipated DC date:  Anticipate 2 midnight or more stay in the hospital    Admission status:  Inpatient   Carlton Adam MD  [facility]  How to contact the [facility] Attending or Consulting provider 7A - 7P or covering provider during after hours 7P -7A, for this patient?   Check the care team in [facility] and look for a) attending/consulting [facility] provider listed and b) the [facility] team listed Log into [email or web address] and use [place]'s universal password to access. If you do not have the password, please contact the hospital operator. Locate the [facility] provider you are looking for under [facility] and page to a number that you can be directly reached. If you still have difficulty reaching the provider, please page the [facility] (Director on Call) for the Hospitalists listed on amion for assistance.  01/20/2021, 1:16 AM

## 2021-01-20 NOTE — Progress Notes (Addendum)
Pharmacy Antibiotic Note  Carla Manning is a 86 y.o. female admitted on 01/19/2021 with a past medical history significant for previous stroke with chronic aphasia and right-sided weakness, hypothyroidism, chronic back pain with neuropathies, hypertension, hyperlipidemia, and seizures who presents for worsened back wound as well as subjective fevers, chills, dry cough, fatigue, and some confusion.Marland Kitchen  Pharmacy has been consulted to dose vancomycin for cellulitis  Plan: Vancomycin 1gm IV x 1 then 750 q36h (AUC 560.7 Scr 0.99) Follow renal function and clinical course     Temp (24hrs), Avg:97.9 F (36.6 C), Min:97.8 F (36.6 C), Max:98 F (36.7 C)  Recent Labs  Lab 01/19/21 1928 01/19/21 2100  WBC  --  9.8  CREATININE 0.99  --   LATICACIDVEN 1.9  --     CrCl cannot be calculated (Unknown ideal weight.).    No Known Allergies  Antimicrobials this admission: Vanc 9/22 >>  Dose adjustments this admission:   Microbiology results: 9/21 BCx:  9/21 UCx:   Thank you for allowing pharmacy to be a part of this patient's care.  Arley Phenix RPh 01/20/2021, 1:25 AM

## 2021-01-20 NOTE — Consult Note (Signed)
Carla Manning Jul 31, 1934  [id number].    Requesting MD: Dr. Burnadette Pop Chief Complaint/Reason for Consult: R buttock wound  HPI:  This is an 86 yo black female with a history of HTN, HLD, seizures post CVA (last one was about 2 years ago) for which she has some right-sided paralysis from these.  She is bed bound at home and her daughter and some of her other children help take care of her.  She does not eat much throughout the day and is unable to get up.  She has aphasia and is not always coherent when trying to communicate with her.   Her daughter states about 4 weeks ago she noted some skin breakdown on this right buttock.  This was treated after a visit to the ED where they were told how to care for the wound, clean it with pericare, and put some dressing over the wound.  Unfortunately it has worsened in the last several days and they brought her her to the ED for evaluation.  She was admitted and we have been asked to see her for further evaluation after seen by WOC, RN.  ROS: ROS: Unable as patient is not able to communicate.  Family History  Problem Relation Age of Onset   Diabetes Mother    Hypertension Daughter    Hypertension Daughter    Diabetes Daughter     Past Medical History:  Diagnosis Date   Anxiety    Depression    GERD (gastroesophageal reflux disease)    Hyperlipidemia    Hypertension    Hypothyroid    Seizures (HCC)    post CVA   Stroke (HCC)    R sided weakness, aphasia    Past Surgical History:  Procedure Laterality Date   NO PAST SURGERIES      Social History:  reports that she has never smoked. She has never used smokeless tobacco. She reports that she does not drink alcohol and does not use drugs.  Allergies: No Known Allergies  Medications Prior to Admission  Medication Sig Dispense Refill   amitriptyline (ELAVIL) 10 MG tablet Take 1 tablet (10 mg total) by mouth at bedtime. 90 tablet 3   amLODipine (NORVASC) 10 MG tablet Take 1  tablet (10 mg total) by mouth daily. 90 tablet 3   atorvastatin (LIPITOR) 80 MG tablet Take 1 tablet (80 mg total) by mouth daily at 6 PM. 90 tablet 3   carvedilol (COREG) 6.25 MG tablet TAKE 1 TABLET BY MOUTH 2 TIMES DAILY WITH FOOD (Patient taking differently: Take 6.25 mg by mouth 2 (two) times daily with a meal.) 180 tablet 1   clopidogrel (PLAVIX) 75 MG tablet Take 1 tablet (75 mg total) by mouth daily. 90 tablet 3   famotidine (PEPCID) 20 MG tablet Take 1 tablet (20 mg total) by mouth 2 (two) times daily. (Patient taking differently: Take 20 mg by mouth daily.) 60 tablet 11   gabapentin (NEURONTIN) 100 MG capsule Take 1 capsule (100 mg total) by mouth 3 (three) times daily. 90 capsule 3   HYDROcodone-acetaminophen (NORCO/VICODIN) 5-325 MG tablet Take 2 tablets by mouth 2 (two) times daily. 60 tablet 0   levETIRAcetam (KEPPRA) 500 MG tablet Take 1 tablet (500 mg total) by mouth every 12 (twelve) hours. 180 tablet 3   levothyroxine (SYNTHROID) 25 MCG tablet Take 1 tablet (25 mcg total) by mouth daily before breakfast. 90 tablet 3     Physical Exam: Blood pressure (!) 98/52, pulse  76, temperature (!) 97.4 F (36.3 C), temperature source Oral, resp. rate 18, weight 62.7 kg, last menstrual period 10/06/2016, SpO2 96 %. General: pleasant, elderly black female who is laying in bed in NAD HEENT: head is normocephalic, atraumatic.  Sclera are noninjected.  PERRL.  Ears and nose without any masses or lesions.  Mouth is pink and moist Abd: soft, NT, ND, +BS, no masses, hernias, or organomegaly Skin: warm and dry with no masses, lesions, or rashes; however she has a stage III right buttock wound noted.  This has a stage III component that is about 4x4x1.5cm in size.  There is some stage II components that extend past the wound that make the whole area total 9x4x1.5cm. 1.  Excisional vs non-excisional. Excisional  2.  Tool used for debridement (curette, scapel, etc.)  scalpel  3.  Frequency of  surgical debridement.   once  4.  Measurement of total devitalized tissue (wound surface) before and after surgical debridement.   Before 9x4x1.5cm; after 9x5x2cm  5.  Area and depth of devitalized tissue removed from wound.  2cm^3  6.  Blood loss and description of tissue removed.  No EBL, tissue was all necrotic subcutaneous tissue, some liquefied.  7.  Evidence of the progress of the wound's response to treatment.  A.  Current wound volume (current dimensions and depth).  80cm^3  B.  Presence (and extent of) of infection.  No  C.  Presence (and extent of) of non viable tissue.  Yes; thin later of necrotic tissue over base of the wound with some necrotic tissue in some of the undermining areas.    D.  Other material in the wound that is expected to inhibit healing.  No   8.  Was there any viable tissue removed (measurements): No  Before:  After:   Psych: unable due to aphasia    Results for orders placed or performed during the hospital encounter of 01/19/21 (from the past 48 hour(s))  Blood culture (routine x 2)     Status: None (Preliminary result)   Collection Time: 01/19/21  6:58 PM   Specimen: BLOOD LEFT FOREARM  Result Value Ref Range   Specimen Description      BLOOD LEFT FOREARM Performed at [facility], [address] [place], [place] [address]    Special Requests      BOTTLES DRAWN AEROBIC ONLY Blood Culture results may not be optimal due to an inadequate volume of blood received in culture bottles Performed at [facility], [address] [place], [place] [address]    Culture      NO GROWTH < 12 HOURS Performed at [facility], [address], [place], [place] [address]    Report Status PENDING   Comprehensive metabolic panel     Status: Abnormal   Collection Time: 01/19/21  7:28 PM  Result Value Ref Range   Sodium 135 135 - 145 mmol/L   Potassium 3.8 3.5 - 5.1 mmol/L   Chloride 92 (L) 98 - 111 mmol/L   CO2 27  22 - 32 mmol/L   Glucose, Bld 112 (H) 70 - 99 mg/dL    Comment: Glucose reference range applies only to samples taken after fasting for at least 8 hours.   BUN 13 8 - 23 mg/dL   Creatinine, Ser 0.86 0.44 - 1.00 mg/dL   Calcium 8.8 (L) 8.9 - 10.3 mg/dL   Total Protein 8.8 (H) 6.5 - 8.1 g/dL   Albumin  3.1 (L) 3.5 - 5.0 g/dL   AST 39 15 - 41 U/L   ALT 18 0 - 44 U/L   Alkaline Phosphatase 125 38 - 126 U/L   Total Bilirubin 1.2 0.3 - 1.2 mg/dL   GFR, Estimated 56 (L) >60 mL/min    Comment: (NOTE) Calculated using the CKD-EPI Creatinine Equation (2021)    Anion gap 16 (H) 5 - 15    Comment: Performed at [facility], [address] [place], [place] [address]  Lactic acid, plasma     Status: None   Collection Time: 01/19/21  7:28 PM  Result Value Ref Range   Lactic Acid, Venous 1.9 0.5 - 1.9 mmol/L    Comment: Performed at [facility], [address] [place], [place] [address]  Lipase, blood     Status: None   Collection Time: 01/19/21  7:28 PM  Result Value Ref Range   Lipase 18 11 - 51 U/L    Comment: Performed at [facility], [address], [place], [place] [address]  TSH     Status: None   Collection Time: 01/19/21  7:28 PM  Result Value Ref Range   TSH 3.158 0.350 - 4.500 uIU/mL    Comment: Performed by a 3rd Generation assay with a functional sensitivity of <=0.01 uIU/mL. Performed at [facility], [address] [place], [place] [address]   Blood culture (routine x 2)     Status: None (Preliminary result)   Collection Time: 01/19/21  7:28 PM   Specimen: BLOOD  Result Value Ref Range   Specimen Description      BLOOD LEFT ANTECUBITAL Performed at [facility], [address] [place], [place] [address]    Special Requests      BOTTLES DRAWN AEROBIC AND ANAEROBIC Blood Culture results may not be optimal due to an inadequate volume of blood received in culture  bottles Performed at [facility], [address], [place], [place] [address]    Culture      NO GROWTH < 12 HOURS Performed at [facility], [address] [place], [place] [address]    Report Status PENDING   CBC with Differential/Platelet     Status: Abnormal   Collection Time: 01/19/21  9:00 PM  Result Value Ref Range   WBC 9.8 4.0 - 10.5 K/uL   RBC 4.79 3.87 - 5.11 MIL/uL   Hemoglobin 13.0 12.0 - 15.0 g/dL   HCT 96.2 95.2 - 84.1 %   MCV 77.0 (L) 80.0 - 100.0 fL   MCH 27.1 26.0 - 34.0 pg   MCHC 35.2 30.0 - 36.0 g/dL   RDW 32.4 40.1 - 02.7 %   Platelets 285 150 - 400 K/uL   nRBC 0.0 0.0 - 0.2 %   Neutrophils Relative % 83 %   Neutro Abs 8.1 (H) 1.7 - 7.7 K/uL   Lymphocytes Relative 13 %   Lymphs Abs 1.3 0.7 - 4.0 K/uL   Monocytes Relative 4 %   Monocytes Absolute 0.4 0.1 - 1.0 K/uL   Eosinophils Relative 0 %   Eosinophils Absolute 0.0 0.0 - 0.5 K/uL   Basophils Relative 0 %   Basophils Absolute 0.0 0.0 - 0.1 K/uL   Immature Granulocytes 0 %   Abs Immature Granulocytes 0.03 0.00 - 0.07 K/uL    Comment: Performed at [facility], [address] [place], [place] [address]  Resp Panel by RT-PCR (  Flu A&B, Covid) Nasopharyngeal Swab     Status: None   Collection Time: 01/19/21 11:22 PM   Specimen: Nasopharyngeal Swab; Nasopharyngeal(NP) swabs in vial transport medium  Result Value Ref Range   SARS Coronavirus 2 by RT PCR NEGATIVE NEGATIVE    Comment: (NOTE) SARS-CoV-2 target nucleic acids are NOT DETECTED.  The SARS-CoV-2 RNA is generally detectable in upper respiratory specimens during the acute phase of infection. The lowest concentration of SARS-CoV-2 viral copies this assay can detect is 138 copies/mL. A negative result does not preclude SARS-Cov-2 infection and should not be used as the sole basis for treatment or other patient management decisions. A negative result may occur with  improper specimen collection/handling,  submission of specimen other than nasopharyngeal swab, presence of viral mutation(s) within the areas targeted by this assay, and inadequate number of viral copies(<138 copies/mL). A negative result must be combined with clinical observations, patient history, and epidemiological information. The expected result is Negative.  Fact Sheet for Patients:  BloggerCourse.com  Fact Sheet for Healthcare Providers:  SeriousBroker.it  This test is no t yet approved or cleared by the [place] FDA and  has been authorized for detection and/or diagnosis of SARS-CoV-2 by FDA under an Emergency Use Authorization (EUA). This EUA will remain  in effect (meaning this test can be used) for the duration of the COVID-19 declaration under Section 564(b)(1) of the Act, 21 U.S.C.section 360bbb-3(b)(1), unless the authorization is terminated  or revoked sooner.       Influenza A by PCR NEGATIVE NEGATIVE   Influenza B by PCR NEGATIVE NEGATIVE    Comment: (NOTE) The Xpert Xpress SARS-CoV-2/FLU/RSV plus assay is intended as an aid in the diagnosis of influenza from Nasopharyngeal swab specimens and should not be used as a sole basis for treatment. Nasal washings and aspirates are unacceptable for Xpert Xpress SARS-CoV-2/FLU/RSV testing.  Fact Sheet for Patients: BloggerCourse.com  Fact Sheet for Healthcare Providers: SeriousBroker.it  This test is not yet approved or cleared by the [place] FDA and has been authorized for detection and/or diagnosis of SARS-CoV-2 by FDA under an Emergency Use Authorization (EUA). This EUA will remain in effect (meaning this test can be used) for the duration of the COVID-19 declaration under Section 564(b)(1) of the Act, 21 U.S.C. section 360bbb-3(b)(1), unless the authorization is terminated or revoked.  Performed at [facility], [address], [place], [place] [address]   Basic metabolic panel     Status: Abnormal   Collection Time: 01/20/21  5:22 AM  Result Value Ref Range   Sodium 136 135 - 145 mmol/L   Potassium 3.3 (L) 3.5 - 5.1 mmol/L   Chloride 96 (L) 98 - 111 mmol/L   CO2 28 22 - 32 mmol/L   Glucose, Bld 103 (H) 70 - 99 mg/dL    Comment: Glucose reference range applies only to samples taken after fasting for at least 8 hours.   BUN 13 8 - 23 mg/dL   Creatinine, Ser 4.78 0.44 - 1.00 mg/dL   Calcium 8.6 (L) 8.9 - 10.3 mg/dL   GFR, Estimated 59 (L) >60 mL/min    Comment: (NOTE) Calculated using the CKD-EPI Creatinine Equation (2021)    Anion gap 12 5 - 15    Comment: Performed at [facility], [address] [place], [place] [address]  CBC     Status: Abnormal   Collection Time: 01/20/21  5:22 AM  Result Value Ref Range   WBC 8.5  4.0 - 10.5 K/uL   RBC 4.59 3.87 - 5.11 MIL/uL   Hemoglobin 12.6 12.0 - 15.0 g/dL   HCT 17.5 (L) 10.2 - 58.5 %   MCV 76.7 (L) 80.0 - 100.0 fL   MCH 27.5 26.0 - 34.0 pg   MCHC 35.8 30.0 - 36.0 g/dL   RDW 27.7 82.4 - 23.5 %   Platelets 246 150 - 400 K/uL   nRBC 0.0 0.0 - 0.2 %    Comment: Performed at [facility], [address], [place], [place] [address]   CT ABDOMEN PELVIS W CONTRAST  Result Date: 01/19/2021 CLINICAL DATA:  Abdominal abscess or infection suspected. Patient has a worsening right sacral wound with stool coming from it. EXAM: CT ABDOMEN AND PELVIS WITH CONTRAST TECHNIQUE: Multidetector CT imaging of the abdomen and pelvis was performed using the standard protocol following bolus administration of intravenous contrast. CONTRAST:  58mL OMNIPAQUE IOHEXOL 350 MG/ML SOLN COMPARISON:  None. FINDINGS: Lower chest: Lung bases are clear.  Coronary artery calcifications. Hepatobiliary: The liver is atrophic with nodular contour possibly indicating hepatic cirrhosis. No focal lesions are identified. Gallbladder is distended with layering  sludge or stones. No wall thickening or inflammatory changes. No bile duct dilatation. Pancreas: Unremarkable. No pancreatic ductal dilatation or surrounding inflammatory changes. Spleen: Normal in size without focal abnormality. Adrenals/Urinary Tract: No adrenal gland nodules. Benign-appearing left renal cysts. Scarring and parenchymal atrophy of the right kidney. No hydronephrosis or hydroureter. Bladder is normal. Stomach/Bowel: The stomach, small bowel, and colon are not abnormally distended. Scattered stool in the colon. Scattered colonic diverticula with sigmoid diverticulosis. No wall thickening or inflammatory changes. No perianal fistula or perianal abscess demonstrated. Vascular/Lymphatic: Aortic atherosclerosis. No enlarged abdominal or pelvic lymph nodes. Reproductive: Uterus and bilateral adnexa are unremarkable. Other: No free air or free fluid in the abdomen. Abdominal wall musculature appears intact. Subcutaneous soft tissue edema lateral to the right hip. Right posterior parasacral decubitus ulceration with stranding and gas in the underlying soft tissues. Gas extends focally into the inferior gluteus muscles. No discrete abscess or fistula identified. Underlying bones appear intact without evidence of osteomyelitis. Musculoskeletal: Degenerative changes in the spine. Slight anterior subluxation of L4 on L5 is likely degenerative. No destructive bone lesions. IMPRESSION: 1. Right parasacral decubitus ulceration with stranding and gas in the underlying subcutaneous fatty tissues most consistent with cellulitis. No discrete abscess identified. No CT findings to suggest fistula or osteomyelitis. 2. Probable hepatic cirrhosis without focal lesion. 3. Distended gallbladder with layering sludge or stones. Possible dysmotility. 4. Aortic atherosclerosis. Electronically Signed   By: Burman Nieves M.D.   On: 01/19/2021 21:41   DG Chest Portable 1 View  Result Date: 01/19/2021 CLINICAL DATA:  Chills  and fatigue.  Rule out occult pneumonia EXAM: PORTABLE CHEST 1 VIEW COMPARISON:  05/03/2020 FINDINGS: Shallow inspiration. Heart size and pulmonary vascularity are normal. Patient rotation limits evaluation but there is no obvious consolidation or airspace disease in the lungs. No pleural effusions. No pneumothorax. Mediastinal contours appear intact. Degenerative changes in the spine and shoulders. IMPRESSION: Shallow inspiration.  No evidence of active pulmonary disease. Electronically Signed   By: Burman Nieves M.D.   On: 01/19/2021 19:35      Assessment/Plan Stage II-Stage III right buttock wound, s/p bedside debridement The portion of the wound with depth is a stage III wound with some necrotic tissue still present, but seems mostly thin over at least the base.  The edges of the wound are Stage II  wounds that have not yet progressed further.  Discussed with family the need for adequate nutrition to help this wound heal, but that ultimately her ability to not get off this site puts it at risk for enlarging or not healing.  They understand.  Recommend frequent turning q 2 hrs while here and at discharge.  Air overlay mattress could be used.  NS WD dressing changes to this wound BID with santyl daily and hydrotherapy to start working on further cleaning this up.  The daughter did state she would like placement at discharge as caring for her mother this intensely at home if not sustainable.  This is totally reasonable.  Relayed this to the Child psychotherapist.  Will resee wound next week with hydrotherapy.   FEN - regular diet, protein shakes if she will drink them VTE - ok from our standpoint ID - Vanc, but none needed for this wound as it is not infected.  Relayed to primary team   Letha Cape, [facility] Surgery 01/20/2021, 11:47 AM Please see Amion for pager number during day hours 7:00am-4:30pm or 7:00am -11:30am on weekends

## 2021-01-20 NOTE — Progress Notes (Signed)
PO meds not administered, patient is closing her mouth and squeezing them together.

## 2021-01-20 NOTE — Progress Notes (Signed)
Pharmacy Antibiotic Note  Carla Manning is a 86 y.o. female admitted on 01/19/2021 with decubitus ulcer of sacral area with infection / cellulitis.  Vancomycin was initiated on admission.   Pharmacy has now been consulted for cefepime dosing.  Plan: Cefepime 2 grams IV q12h Continue vancomycin 750 mg IV q36h Follow renal function, culture results, clinical course  Weight: 62.7 kg (138 lb 3.7 oz)  Temp (24hrs), Avg:97.9 F (36.6 C), Min:97.4 F (36.3 C), Max:98.5 F (36.9 C)  Recent Labs  Lab 01/19/21 1928 01/19/21 2100 01/20/21 0522  WBC  --  9.8 8.5  CREATININE 0.99  --  0.95  LATICACIDVEN 1.9  --   --     Estimated Creatinine Clearance: 35.5 mL/min (by C-G formula based on SCr of 0.95 mg/dL).    No Known Allergies  Antimicrobials this admission: 9/22 vancomycin >>  9/22 cefepime >>   Dose adjustments this admission:   Microbiology results:  9/21 BCx: collected  Thank you for allowing pharmacy to be a part of this patient's care.  Elie Goody, PharmD, BCPS [facility] [phone] 01/20/2021  12:24 PM

## 2021-01-20 NOTE — Progress Notes (Signed)
Initial Nutrition Assessment  INTERVENTION:   -Ensure Surgery PO BID, each provides 330 kcals and 18g protein   -Magic cup TID with meals, each supplement provides 290 kcal and 9 grams of protein  -Multivitamin with minerals daily  -250 mg Vitamin C BID  NUTRITION DIAGNOSIS:   Increased nutrient needs related to wound healing as evidenced by estimated needs.  GOAL:   Patient will meet greater than or equal to 90% of their needs  MONITOR:   PO intake, Supplement acceptance, Labs, Weight trends, I & O's, Skin  REASON FOR ASSESSMENT:    (Wounds)    ASSESSMENT:   86 year old female with history of stroke x2, bedbound, right hemiparesis, advanced dementia, hypertension, seizure disorder who was brought out to the hospital by her daughter because she could not take care of her anymore and she has noticed a pressure ulcer on her right sacral area.  Patient is nonambulatory and has been bedbound. 9/22: s/p bedside debridement  Pt alert/oriented x 1, nonverbal. Consumed very little PO, 0-5% of meals.  Pt is able to feed self per chart. Will order Ensure supplements for additional kcals and protein.  Per weight records, pt has lost 86 lbs since 1/3 (38% wt loss x 8.5 months, significant for time frame).  Medications: Lactated ringers  Labs reviewed: Low K  NUTRITION - FOCUSED PHYSICAL EXAM:  Deferred.  Diet Order:   Diet Order             Diet Heart Room service appropriate? Yes; Fluid consistency: Thin  Diet effective now                   EDUCATION NEEDS:   Not appropriate for education at this time  Skin:  Skin Assessment: Skin Integrity Issues: Skin Integrity Issues:: Unstageable Unstageable: rt buttocks -Per surgery note, it is stage 2-3  Last BM:  PTA  Height:   Ht Readings from Last 1 Encounters:  12/23/20 4\' 11"  (1.499 m)    Weight:   Wt Readings from Last 1 Encounters:  01/20/21 62.7 kg    BMI:  Body mass index is 27.92  kg/m.  Estimated Nutritional Needs:   Kcal:  1500-1700  Protein:  75-95g  Fluid:  1.7L/day  01/22/21, MS, RD, LDN Inpatient Clinical Dietitian Contact information available via Amion

## 2021-01-20 NOTE — Evaluation (Signed)
Occupational Therapy Evaluation Patient Details Name: Carla Manning MRN: [id number] DOB: April 25, 1935 Today's Date: 01/20/2021   History of Present Illness Patient is a 86 year old female with history of stroke x2, bedbound, right hemiparesis, advanced dementia, hypertension, seizure disorder who was brought out to the hospital by her daughter because she could not take care of her anymore and she has noticed a pressure ulcer on her right sacral area.   Clinical Impression   Carla Manning is an 86 year old woman admitted to hospital for infected sacral decubitus. On evaluation patient presents with right upper extremity contracture and limited use of left upper extremity. Patient exhibited minimal movement of left lower extremity and none in the right with attempts at bed mobility. Patient appears to be very [facility] but when asked if she can hear me she nods yes. However, patient doesn't follow any commands - not even "squeeze my hand" or "lift your arm." She is able to make her needs known verbally stating "I don't like that" and "lay me down." She does exhibit some purposeful movement of LUE - washing half of her face and maneuvering the bed covers but was unable to hold a spoon for self feeding. Patient total assist for bed mobility, feeding and ADLs except for patient assisting with washing face. Family in room at end of evaluation and reports she needs assistance for all tasks including feeding for a long time and that she hasn't walked in almost a year. Patient is at her baseline in regards to physical and functional abilities. Patient unable to follow commands or comprehend therapeutic process with minimal to no rehab potential. No OT treatment indicated.      Recommendations for follow up therapy are one component of a multi-disciplinary discharge planning process, led by the attending physician.  Recommendations may be updated based on patient status, additional functional criteria and insurance  authorization.   Follow Up Recommendations  No OT follow up    Equipment Recommendations  None recommended by OT    Recommendations for Other Services       Precautions / Restrictions Precautions Precautions: Other (comment) Precaution Comments: Contracted RUE - painful with all movement Restrictions Weight Bearing Restrictions: No      Mobility Bed Mobility Overal bed mobility: Needs Assistance Bed Mobility: Rolling;Supine to Sit;Sit to Supine Rolling: Total assist   Supine to sit: Total assist Sit to supine: Total assist   General bed mobility comments: Therapist attempted supine to sit and performed partially and patient started crying out in pain and stating "no, no, no." Patient not actively assisting and is total assist for bed mobility.    Transfers                 General transfer comment: unable.    Balance                                           ADL either performed or assessed with clinical judgement   ADL Overall ADL's : At baseline                                       General ADL Comments: Patient requires near total care. Shee was able to grossly wash the lower part of her face but otherwise dependent.  Vision   Vision Assessment?: No apparent visual deficits     Perception     Praxis      Pertinent Vitals/Pain Pain Assessment: Faces Faces Pain Scale: Hurts little more Pain Location: RUE and buttocks Pain Descriptors / Indicators: Grimacing;Guarding;Crying Pain Intervention(s): Limited activity within patient's tolerance     Hand Dominance Right   Extremity/Trunk Assessment Upper Extremity Assessment Upper Extremity Assessment: RUE deficits/detail;LUE deficits/detail RUE Deficits / Details: PROm to approx 45 shoulder flexion - limited by pain tightness, elbow grossly functional with passive ROm but lagging approx 20 degrees extension, wrist contracted in to neutral, 1st digit contracted  at IP, and second and 3rd digit contracted at the PIP and DIPs, 4th and 5th digit stiff in extension. Unable to fully range due to patient resistant from pain with movement. No active movement noted. RUE Coordination: decreased fine motor;decreased gross motor LUE Deficits / Details: Can passively be ranged to functional limits, exhibits some active and purposeful movement LUE Coordination:  (grossly functional)   Lower Extremity Assessment Lower Extremity Assessment: Defer to PT evaluation       Communication Communication Communication: Expressive difficulties;HOH   Cognition Arousal/Alertness: Awake/alert Behavior During Therapy: WFL for tasks assessed/performed Overall Cognitive Status: Within Functional Limits for tasks assessed                                     General Comments       Exercises     Shoulder Instructions      Home Living Family/patient expects to be discharged to:: Skilled nursing facility Living Arrangements: Children                                      Prior Functioning/Environment Level of Independence: Needs assistance  Gait / Transfers Assistance Needed: bed bound. Daughter reports she hasn't walked for close to a year ADL's / Homemaking Assistance Needed: Total assist for ADLs. Family typically feeds patient as well.            OT Problem List: Decreased strength;Decreased range of motion;Decreased activity tolerance;Decreased coordination;Decreased cognition;Decreased safety awareness;Decreased knowledge of use of DME or AE;Pain;Impaired UE functional use;Impaired tone      OT Treatment/Interventions:      OT Goals(Current goals can be found in the care plan section) Acute Rehab OT Goals OT Goal Formulation: All assessment and education complete, DC therapy  OT Frequency:     Barriers to D/C:            Co-evaluation              AM-PAC OT "6 Clicks" Daily Activity     Outcome Measure Help  from another person eating meals?: Total Help from another person taking care of personal grooming?: Total Help from another person toileting, which includes using toliet, bedpan, or urinal?: Total Help from another person bathing (including washing, rinsing, drying)?: Total Help from another person to put on and taking off regular upper body clothing?: Total Help from another person to put on and taking off regular lower body clothing?: Total 6 Click Score: 6   End of Session Nurse Communication: Mobility status  Activity Tolerance: Patient limited by pain Patient left: in bed;with call bell/phone within reach;with bed alarm set  OT Visit Diagnosis: Pain;Hemiplegia and hemiparesis Hemiplegia - Right/Left: Right Hemiplegia - dominant/non-dominant:  Dominant Hemiplegia - caused by: Cerebral infarction Pain - Right/Left: Right Pain - part of body: Arm;Shoulder;Hand                Time: 6213-0865 OT Time Calculation (min): 17 min Charges:  OT General Charges $OT Visit: 1 Visit OT Evaluation $OT Eval Low Complexity: 1 Low  Sahand Gosch, OTR/L Acute Care Rehab Services  Office [phone] Pager: [phone]   Kelli Churn 01/20/2021, 2:24 PM

## 2021-01-21 LAB — BASIC METABOLIC PANEL
Anion gap: 13 (ref 5–15)
BUN: 10 mg/dL (ref 8–23)
CO2: 29 mmol/L (ref 22–32)
Calcium: 8.5 mg/dL — ABNORMAL LOW (ref 8.9–10.3)
Chloride: 93 mmol/L — ABNORMAL LOW (ref 98–111)
Creatinine, Ser: 0.76 mg/dL (ref 0.44–1.00)
GFR, Estimated: >60 mL/min (ref >60)
Glucose, Bld: 137 mg/dL — ABNORMAL HIGH (ref 70–99)
Potassium: 2.8 mmol/L — ABNORMAL LOW (ref 3.5–5.1)
Sodium: 135 mmol/L (ref 135–145)

## 2021-01-21 MED ORDER — COLLAGENASE 250 UNIT/GM EX OINT: TOPICAL_OINTMENT | Freq: Every day | CUTANEOUS | 1 refills | Status: AC

## 2021-01-21 MED ORDER — POLYETHYLENE GLYCOL 3350 17 G PO PACK: 17.0000 g | PACK | Freq: Every day | ORAL | 0 refills | Status: AC | PRN

## 2021-01-21 MED ORDER — ENSURE SURGERY PO LIQD: 237.0000 mL | Freq: Two times a day (BID) | ORAL | 12 refills | Status: AC

## 2021-01-21 MED ORDER — ASCORBIC ACID 250 MG PO TABS: 250.0000 mg | ORAL_TABLET | Freq: Two times a day (BID) | ORAL | 0 refills | Status: AC

## 2021-01-21 MED ORDER — NYSTATIN 100000 UNIT/GM EX POWD
Freq: Two times a day (BID) | CUTANEOUS | Status: DC
  Filled 2021-01-21: qty 15

## 2021-01-21 MED ORDER — POTASSIUM CHLORIDE 20 MEQ PO PACK
40.0000 meq | PACK | Freq: Once | ORAL | Status: DC
  Filled 2021-01-21: qty 2

## 2021-01-21 MED ORDER — AMOXICILLIN-POT CLAVULANATE 875-125 MG PO TABS: 1.0000 | ORAL_TABLET | Freq: Two times a day (BID) | ORAL | 0 refills | Status: AC

## 2021-01-21 MED ORDER — POTASSIUM CHLORIDE CRYS ER 20 MEQ PO TBCR
80.0000 meq | EXTENDED_RELEASE_TABLET | Freq: Once | ORAL | Status: AC
  Administered 2021-01-21: 80 meq via ORAL
  Filled 2021-01-21: qty 4

## 2021-01-21 MED ORDER — POTASSIUM CHLORIDE 10 MEQ/100ML IV SOLN
10.0000 meq | INTRAVENOUS | Status: DC
  Administered 2021-01-21: 10 meq via INTRAVENOUS
  Filled 2021-01-21: qty 100

## 2021-01-21 MED ORDER — POTASSIUM CHLORIDE 20 MEQ PO PACK
40.0000 meq | PACK | ORAL | Status: DC
  Filled 2021-01-21: qty 2

## 2021-01-21 MED ORDER — POTASSIUM CHLORIDE 20 MEQ PO PACK: 40.0000 meq | PACK | Freq: Every day | ORAL | 0 refills | Status: AC

## 2021-01-21 NOTE — Discharge Summary (Addendum)
Physician Discharge Summary  Carla Manning ZOX:[id number] DOB: [birth year] DOA: 01/19/2021  PCP: Oneita Hurt, No  Admit date: 01/19/2021 Discharge date: 01/24/2021  Admitted From: Home Disposition:  Home  Discharge Condition:Stable CODE STATUS:FULL Diet recommendation:  Regular    Brief/Interim Summary:  Patient is a 86 year old female with history of stroke x2, bedbound, right hemiparesis, advanced dementia, hypertension, seizure disorder who was brought out to the hospital by her daughter because she could not take care of her anymore and she has noticed a pressure ulcer on her right sacral area.  Patient is nonambulatory and has been bedbound.  Daughter noticed the ulcer about a month ago.  No report of fever, chills at home.  On presentation she was hemodynamically stable.  She was found to have large sacral decubitus ulcer on her right buttock about 4 to 5 cm in diameter with foul-smelling discharge.  CT scan revealed gas in the region consistent with cellulitis.  Wound care was consulted who recommended general surgery saw the patient, does not feel that wound is infected and did not recommend debridement or antibiotics.  Antibiotics changed to oral today.  Since patient is bedbound, nonambulatory, nursing facility placement looked challenging and difficult so patient is being discharged to home with home health.  Following problems were addressed during her hospitalization:   Right sacral decubitus ulcer: Found to have a sacral decubitus ulcer on the upper right buttock.  Wound care consulted.  Size of about 4X5X 2 cm.  Wound care recommended consideration of general consult for possible debridement at bedside.  Wound was foul-smelling. CT showed right parasacral decubitus ulceration with stranding and gas in the underlying subcutaneous fatty tissues , consistent with cellulitis. No discrete abscess identified. No CT findings to suggest fistula or osteomyelitis. General surgery consulted,oes not  feel that wound is infected and did not recommend debridement or antibiotics.  Antibiotics changed to oral.   She is afebrile, she does not have leukocytosis.   Hypertension: Continue current medications.     Hypothyroidism: Continue levothyroxine   Hyperlipidemia: On statin   Dementia: Confused at baseline.  Unable to ambulate.  Feeds by herself though.   History of CVA: Has chronic right hemiplegia.  Bedbound.  On Plavix.   History of seizure disorder: On Keppra   Hypokalemia: Supplemented aggressively.  Continue supplementation at home.  Check BMP in a week   Disposition: Lives with daughter. She wanted her to be placed in a facility. Since patient is bedbound, nonambulatory, nursing facility placement looked challenging and difficult so patient is being discharged to home with home health.  We recommend follow-up with palliative care as an outpatient.    Discharge Diagnoses:  Principal Problem:   Decubitus ulcer of sacral area Active Problems:   Hypothyroidism   Essential hypertension   History of CVA with residual deficit   Dementia [facility])    Discharge Instructions  Discharge Instructions     Diet general   Complete by: As directed    Discharge instructions   Complete by: As directed    1)Please take prescribed medications as instructed 2)Follow up with outpatient wound care, home health, outpatient palliative therapy 3)Follow up with your PCP in 1 week.  Do a BMP test during the follow-up   Increase activity slowly   Complete by: As directed    No wound care   Complete by: As directed    NS WD dressing changes to this wound BID with santyl daily      Allergies as of  01/24/2021   No Known Allergies      Medication List     TAKE these medications    amitriptyline 10 MG tablet Commonly known as: ELAVIL Take 1 tablet (10 mg total) by mouth at bedtime.   amLODipine 10 MG tablet Commonly known as: NORVASC Take 1 tablet (10 mg total) by mouth daily.    amoxicillin-clavulanate 875-125 MG tablet Commonly known as: AUGMENTIN Take 1 tablet by mouth 2 (two) times daily for 6 days.   ascorbic acid 250 MG tablet Commonly known as: VITAMIN C Take 1 tablet (250 mg total) by mouth 2 (two) times daily.   atorvastatin 80 MG tablet Commonly known as: LIPITOR Take 1 tablet (80 mg total) by mouth daily at 6 PM.   carvedilol 6.25 MG tablet Commonly known as: COREG TAKE 1 TABLET BY MOUTH 2 TIMES DAILY WITH FOOD What changed:  how much to take how to take this when to take this additional instructions   clopidogrel 75 MG tablet Commonly known as: PLAVIX Take 1 tablet (75 mg total) by mouth daily.   collagenase ointment Commonly known as: SANTYL Apply topically daily. Apply to sacral wound. Wound size: 4 X 5 X 2 cm with 1 cm undermining   famotidine 20 MG tablet Commonly known as: PEPCID Take 1 tablet (20 mg total) by mouth 2 (two) times daily. What changed: when to take this   feeding supplement Liqd Take 237 mLs by mouth 2 (two) times daily between meals.   gabapentin 100 MG capsule Commonly known as: NEURONTIN Take 1 capsule (100 mg total) by mouth 3 (three) times daily.   HYDROcodone-acetaminophen 5-325 MG tablet Commonly known as: NORCO/VICODIN Take 1 tablet by mouth every 6 (six) hours as needed for moderate pain. What changed:  how much to take when to take this reasons to take this   levETIRAcetam 500 MG tablet Commonly known as: KEPPRA Take 1 tablet (500 mg total) by mouth every 12 (twelve) hours.   levothyroxine 25 MCG tablet Commonly known as: SYNTHROID Take 1 tablet (25 mcg total) by mouth daily before breakfast.   nystatin powder Commonly known as: MYCOSTATIN/NYSTOP Apply topically 2 (two) times daily.   polyethylene glycol 17 g packet Commonly known as: MiraLax Take 17 g by mouth daily as needed.   potassium chloride 20 MEQ packet Commonly known as: KLOR-CON Take 40 mEq by mouth daily for 5 days.                Durable Medical Equipment  (From admission, onward)           Start     Ordered   01/21/21 1432  For home use only DME Hospital bed  Once       Question Answer Comment  Length of Need Lifetime   The above medical condition requires: Patient requires the ability to reposition frequently   Bed type Semi-electric      01/21/21 1432            Follow-up Information     [facility]              Follow up.   Why: Follow up for outpatient wound care Contact information: [address] [place] [phone]        [facility] Follow up.   Why: Follow up with your PCP next week Contact information: Address: [address] [place], [place] [address] Phone: [phone])  [phone]        [facility] Follow [facility].   Specialty: Home Health Services Why: They will contact you about setting up a time for home health services Contact information: [address] [place] [address] [phone]         [facility] Follow up.   Why: They will contact you about setting up an assessment for services Contact information: [address] [facility] [address]               No Known Allergies  Consultations: General surgery   Procedures/Studies: CT ABDOMEN PELVIS W CONTRAST  Result Date: 01/19/2021 CLINICAL DATA:  Abdominal abscess or infection suspected. Patient has a worsening right sacral wound with stool coming from it. EXAM: CT ABDOMEN AND PELVIS WITH CONTRAST TECHNIQUE: Multidetector CT imaging of the abdomen and pelvis was performed using the standard protocol following bolus administration of intravenous contrast. CONTRAST:  43mL OMNIPAQUE IOHEXOL 350 MG/ML SOLN COMPARISON:  None. FINDINGS: Lower chest: Lung bases are clear.  Coronary artery calcifications. Hepatobiliary: The liver is atrophic with nodular contour  possibly indicating hepatic cirrhosis. No focal lesions are identified. Gallbladder is distended with layering sludge or stones. No wall thickening or inflammatory changes. No bile duct dilatation. Pancreas: Unremarkable. No pancreatic ductal dilatation or surrounding inflammatory changes. Spleen: Normal in size without focal abnormality. Adrenals/Urinary Tract: No adrenal gland nodules. Benign-appearing left renal cysts. Scarring and parenchymal atrophy of the right kidney. No hydronephrosis or hydroureter. Bladder is normal. Stomach/Bowel: The stomach, small bowel, and colon are not abnormally distended. Scattered stool in the colon. Scattered colonic diverticula with sigmoid diverticulosis. No wall thickening or inflammatory changes. No perianal fistula or perianal abscess demonstrated. Vascular/Lymphatic: Aortic atherosclerosis. No enlarged abdominal or pelvic lymph nodes. Reproductive: Uterus and bilateral adnexa are unremarkable. Other: No free air or free fluid in the abdomen. Abdominal wall musculature appears intact. Subcutaneous soft tissue edema lateral to the right hip. Right posterior parasacral decubitus ulceration with stranding and gas in the underlying soft tissues. Gas extends focally into the inferior gluteus muscles. No discrete abscess or fistula identified. Underlying bones appear intact without evidence of osteomyelitis. Musculoskeletal: Degenerative changes in the spine. Slight anterior subluxation of L4 on L5 is likely degenerative. No destructive bone lesions. IMPRESSION: 1. Right parasacral decubitus ulceration with stranding and gas in the underlying subcutaneous fatty tissues most consistent with cellulitis. No discrete abscess identified. No CT findings to suggest fistula or osteomyelitis. 2. Probable hepatic cirrhosis without focal lesion. 3. Distended gallbladder with layering sludge or stones. Possible dysmotility. 4. Aortic atherosclerosis. Electronically Signed   By: Burman Nieves M.D.   On: 01/19/2021 21:41   DG Chest Portable 1 View  Result Date: 01/19/2021 CLINICAL DATA:  Chills and fatigue.  Rule out occult pneumonia EXAM: PORTABLE CHEST 1 VIEW COMPARISON:  05/03/2020 FINDINGS: Shallow inspiration. Heart size and pulmonary vascularity are normal. Patient rotation limits evaluation but there is no obvious consolidation or airspace disease in the lungs. No pleural effusions. No pneumothorax. Mediastinal contours appear intact. Degenerative changes in the spine and shoulders. IMPRESSION: Shallow inspiration.  No evidence of active pulmonary disease. Electronically Signed   By: Burman Nieves M.D.   On: 01/19/2021 19:35      Subjective: Patient seen and examined the bedside this morning.  Hemodynamically stable for discharge  Discharge Exam: Vitals:   01/23/21 2053 01/24/21 0518  BP: 136/85 135/81  Pulse: 82 90  Resp: 20 20  Temp: Marland Kitchen)  97.3 F (36.3 C) 98 F (36.7 C)  SpO2: 96% 97%   Vitals:   01/23/21 0511 01/23/21 1351 01/23/21 2053 01/24/21 0518  BP: 117/66 108/65 136/85 135/81  Pulse: 94 73 82 90  Resp: Temp: 97.7 F (36.5 C) 98.2 F (36.8 C) (!) 97.3 F (36.3 C) 98 F (36.7 C)  TempSrc: Oral  Oral Oral  SpO2: 99% 96% 96% 97%  Weight:        General: Pt is alert, awake, not in acute distress, very deconditioned Cardiovascular: RRR, S1/S2 +, no rubs, no gallops Respiratory: CTA bilaterally, no wheezing, no rhonchi Abdominal: Soft, NT, ND, bowel sounds + Extremities: no edema, no cyanosis, right sacral ulcer    The results of significant diagnostics from this hospitalization (including imaging, microbiology, ancillary and laboratory) are listed below for reference.     Microbiology: Recent Results (from the past 240 hour(s))  Blood culture (routine x 2)     Status: None (Preliminary result)   Collection Time: 01/19/21  6:58 PM   Specimen: BLOOD LEFT FOREARM  Result Value Ref Range Status   Specimen Description    Final    BLOOD LEFT FOREARM Performed at [facility], [address], [place], [place] [address]    Special Requests   Final    BOTTLES DRAWN AEROBIC ONLY Blood Culture results may not be optimal due to an inadequate volume of blood received in culture bottles Performed at [facility], [address] [place], [place] [address]    Culture   Final    NO GROWTH 4 DAYS Performed at [facility], [address], [place], [place] [address]    Report Status PENDING  Incomplete  Blood culture (routine x 2)     Status: None (Preliminary result)   Collection Time: 01/19/21  7:28 PM   Specimen: BLOOD  Result Value Ref Range Status   Specimen Description   Final    BLOOD LEFT ANTECUBITAL Performed at [facility], [address], [place], [place] [address]    Special Requests   Final    BOTTLES DRAWN AEROBIC AND ANAEROBIC Blood Culture results may not be optimal due to an inadequate volume of blood received in culture bottles Performed at [facility], [address], [place], [place] [address]    Culture   Final    NO GROWTH 4 DAYS Performed at [facility], [address], [place], [place] [address]    Report Status PENDING  Incomplete  Resp Panel by RT-PCR (Flu A&B, Covid) Nasopharyngeal Swab     Status: None   Collection Time: 01/19/21 11:22 PM   Specimen: Nasopharyngeal Swab; Nasopharyngeal(NP) swabs in vial transport medium  Result Value Ref Range Status   SARS Coronavirus 2 by RT PCR NEGATIVE NEGATIVE Final    Comment: (NOTE) SARS-CoV-2 target nucleic acids are NOT DETECTED.  The SARS-CoV-2 RNA is generally detectable in upper respiratory specimens during the acute phase of infection. The lowest concentration of SARS-CoV-2 viral copies this assay can detect is 138 copies/mL. A negative result does not preclude SARS-Cov-2 infection and should not be used as the sole basis for treatment  or other patient management decisions. A negative result may occur with  improper specimen collection/handling, submission of specimen other than nasopharyngeal swab, presence of viral mutation(s) within the areas targeted by this assay, and inadequate number of viral copies(<138 copies/mL). A negative result must be combined  with clinical observations, patient history, and epidemiological information. The expected result is Negative.  Fact Sheet for Patients:  BloggerCourse.com  Fact Sheet for Healthcare Providers:  SeriousBroker.it  This test is no t yet approved or cleared by the [place] FDA and  has been authorized for detection and/or diagnosis of SARS-CoV-2 by FDA under an Emergency Use Authorization (EUA). This EUA will remain  in effect (meaning this test can be used) for the duration of the COVID-19 declaration under Section 564(b)(1) of the Act, 21 U.S.C.section 360bbb-3(b)(1), unless the authorization is terminated  or revoked sooner.       Influenza A by PCR NEGATIVE NEGATIVE Final   Influenza B by PCR NEGATIVE NEGATIVE Final    Comment: (NOTE) The Xpert Xpress SARS-CoV-2/FLU/RSV plus assay is intended as an aid in the diagnosis of influenza from Nasopharyngeal swab specimens and should not be used as a sole basis for treatment. Nasal washings and aspirates are unacceptable for Xpert Xpress SARS-CoV-2/FLU/RSV testing.  Fact Sheet for Patients: BloggerCourse.com  Fact Sheet for Healthcare Providers: SeriousBroker.it  This test is not yet approved or cleared by the [place] FDA and has been authorized for detection and/or diagnosis of SARS-CoV-2 by FDA under an Emergency Use Authorization (EUA). This EUA will remain in effect (meaning this test can be used) for the duration of the COVID-19 declaration under Section 564(b)(1) of the Act, 21 U.S.C. section  360bbb-3(b)(1), unless the authorization is terminated or revoked.  Performed at [facility], [address], [place], [place] [address]      Labs: BNP (last 3 results) No results for input(s): BNP in the last 8760 hours. Basic Metabolic Panel: Recent Labs  Lab 01/19/21 1928 01/20/21 0522 01/21/21 0501 01/23/21 1021  NA 135 136 135 136  K 3.8 3.3* 2.8* 3.2*  CL 92* 96* 93* 97*  CO2 27 28 29 30   GLUCOSE 112* 103* 137* 134*  BUN 13 13 10  6*  CREATININE 0.99 0.95 0.76 0.63  CALCIUM 8.8* 8.6* 8.5* 8.5*   Liver Function Tests: Recent Labs  Lab 01/19/21 1928  AST 39  ALT 18  ALKPHOS 125  BILITOT 1.2  PROT 8.8*  ALBUMIN 3.1*   Recent Labs  Lab 01/19/21 1928  LIPASE 18   No results for input(s): AMMONIA in the last 168 hours. CBC: Recent Labs  Lab 01/19/21 2100 01/20/21 0522  WBC 9.8 8.5  NEUTROABS 8.1*  --   HGB 13.0 12.6  HCT 36.9 35.2*  MCV 77.0* 76.7*  PLT 285 246   Cardiac Enzymes: No results for input(s): CKTOTAL, CKMB, CKMBINDEX, TROPONINI in the last 168 hours. BNP: Invalid input(s): POCBNP CBG: No results for input(s): GLUCAP in the last 168 hours. D-Dimer No results for input(s): DDIMER in the last 72 hours. Hgb A1c No results for input(s): HGBA1C in the last 72 hours. Lipid Profile No results for input(s): CHOL, HDL, LDLCALC, TRIG, CHOLHDL, LDLDIRECT in the last 72 hours. Thyroid function studies No results for input(s): TSH, T4TOTAL, T3FREE, THYROIDAB in the last 72 hours.  Invalid input(s): FREET3  Anemia work up No results for input(s): VITAMINB12, FOLATE, FERRITIN, TIBC, IRON, RETICCTPCT in the last 72 hours. Urinalysis    Component Value Date/Time   COLORURINE YELLOW 05/03/2020 1934   APPEARANCEUR CLEAR 05/03/2020 1934   LABSPEC 1.015 05/03/2020 1934   PHURINE 7.0 05/03/2020 1934   GLUCOSEU NEGATIVE 05/03/2020 1934   HGBUR TRACE (A) 05/03/2020 1934   BILIRUBINUR NEGATIVE 05/03/2020 1934   KETONESUR  NEGATIVE 05/03/2020 1934   PROTEINUR NEGATIVE 05/03/2020 1934   UROBILINOGEN 0.2 02/10/2014 0900   NITRITE NEGATIVE 05/03/2020 1934   LEUKOCYTESUR SMALL (A) 05/03/2020 1934   Sepsis Labs Invalid input(s): PROCALCITONIN,  WBC,  LACTICIDVEN Microbiology Recent Results (from the past 240 hour(s))  Blood culture (routine x 2)     Status: None (Preliminary result)   Collection Time: 01/19/21  6:58 PM   Specimen: BLOOD LEFT FOREARM  Result Value Ref Range Status   Specimen Description   Final    BLOOD LEFT FOREARM Performed at [facility], [address], [place], [place] [address]    Special Requests   Final    BOTTLES DRAWN AEROBIC ONLY Blood Culture results may not be optimal due to an inadequate volume of blood received in culture bottles Performed at [facility], [address] [place], [place] [address]    Culture   Final    NO GROWTH 4 DAYS Performed at [facility], [address], [place], [place] [address]    Report Status PENDING  Incomplete  Blood culture (routine x 2)     Status: None (Preliminary result)   Collection Time: 01/19/21  7:28 PM   Specimen: BLOOD  Result Value Ref Range Status   Specimen Description   Final    BLOOD LEFT ANTECUBITAL Performed at [facility], [address], [place], [place] [address]    Special Requests   Final    BOTTLES DRAWN AEROBIC AND ANAEROBIC Blood Culture results may not be optimal due to an inadequate volume of blood received in culture bottles Performed at [facility], [address] [place], [place] [address]    Culture   Final    NO GROWTH 4 DAYS Performed at [facility], [address], [place], [place] [address]    Report Status PENDING  Incomplete  Resp Panel by RT-PCR (Flu A&B, Covid) Nasopharyngeal Swab     Status: None   Collection Time: 01/19/21 11:22 PM   Specimen: Nasopharyngeal Swab; Nasopharyngeal(NP) swabs in vial  transport medium  Result Value Ref Range Status   SARS Coronavirus 2 by RT PCR NEGATIVE NEGATIVE Final    Comment: (NOTE) SARS-CoV-2 target nucleic acids are NOT DETECTED.  The SARS-CoV-2 RNA is generally detectable in upper respiratory specimens during the acute phase of infection. The lowest concentration of SARS-CoV-2 viral copies this assay can detect is 138 copies/mL. A negative result does not preclude SARS-Cov-2 infection and should not be used as the sole basis for treatment or other patient management decisions. A negative result may occur with  improper specimen collection/handling, submission of specimen other than nasopharyngeal swab, presence of viral mutation(s) within the areas targeted by this assay, and inadequate number of viral copies(<138 copies/mL). A negative result must be combined with clinical observations, patient history, and epidemiological information. The expected result is Negative.  Fact Sheet for Patients:  BloggerCourse.com  Fact Sheet for Healthcare Providers:  SeriousBroker.it  This test is no t yet approved or cleared by the [place] FDA and  has been authorized for detection and/or diagnosis of SARS-CoV-2 by FDA under an Emergency Use Authorization (EUA). This EUA will remain  in effect (meaning this test can be used) for the duration of the COVID-19 declaration under Section 564(b)(1) of the Act, 21 U.S.C.section 360bbb-3(b)(1), unless the authorization is terminated  or revoked sooner.       Influenza A by PCR NEGATIVE NEGATIVE  Final   Influenza B by PCR NEGATIVE NEGATIVE Final    Comment: (NOTE) The Xpert Xpress SARS-CoV-2/FLU/RSV plus assay is intended as an aid in the diagnosis of influenza from Nasopharyngeal swab specimens and should not be used as a sole basis for treatment. Nasal washings and aspirates are unacceptable for Xpert Xpress SARS-CoV-2/FLU/RSV testing.  Fact  Sheet for Patients: BloggerCourse.com  Fact Sheet for Healthcare Providers: SeriousBroker.it  This test is not yet approved or cleared by the [place] FDA and has been authorized for detection and/or diagnosis of SARS-CoV-2 by FDA under an Emergency Use Authorization (EUA). This EUA will remain in effect (meaning this test can be used) for the duration of the COVID-19 declaration under Section 564(b)(1) of the Act, 21 U.S.C. section 360bbb-3(b)(1), unless the authorization is terminated or revoked.  Performed at [facility], [address], [place], [place] [address]     Please note: You were cared for by a hospitalist during your hospital stay. Once you are discharged, your primary care physician will handle any further medical issues. Please note that NO REFILLS for any discharge medications will be authorized once you are discharged, as it is imperative that you return to your primary care physician (or establish a relationship with a primary care physician if you do not have one) for your post hospital discharge needs so that they can reassess your need for medications and monitor your lab values.    Time coordinating discharge: 40 minutes  SIGNED:   Burnadette Pop, MD  [facility] 01/24/2021, 8:07 AM Pager [phone]  If 7PM-7AM, please contact night-coverage [email or web address] Password [email or web address]

## 2021-01-21 NOTE — Progress Notes (Addendum)
Patient refused all medications, as well as ensure, flu vaccine, and lovenox this morning. Daughter arrived to unit and encouraged patient to take medication. Patient noted to have taken some of her medications. MD notified.

## 2021-01-21 NOTE — [place] FL2 (Signed)
[place] MEDICAID FL2 LEVEL OF CARE SCREENING TOOL     IDENTIFICATION  Patient Name: Carla Manning Birthdate: June 30, 1934 Sex: female Admission Date (Current Location): 01/19/2021  [facility] and [place] Number:  Producer, television/film/video and Address:  [facility],  [address] [place], [place] [address]      Provider Number: [id number]  Attending Physician Name and Address:  Burnadette Pop, MD  Relative Name and Phone Number:  [name] (Daughter)   [phone]    Current Level of Care: Hospital Recommended Level of Care: Skilled Nursing Facility Prior Approval Number:    Date Approved/Denied:   PASRR Number:    Discharge Plan: SNF    Current Diagnoses: Patient Active Problem List   Diagnosis Date Noted   Dementia (HCC) 01/20/2021   Decubitus ulcer of sacral area 01/19/2021   History of CVA with residual deficit 07/20/2015   Vitamin B6 deficiency neuropathy 06/25/2014   Occipital neuralgia 06/25/2014   Essential hypertension 06/25/2014   HLD (hyperlipidemia) 06/25/2014   Paresthesia and pain of right extremity 04/09/2013   Hypothyroidism 08/31/2012   Partial seizure (HCC) 08/29/2012   GERD (gastroesophageal reflux disease) 05/30/2011   Cerebrovascular disease, arteriosclerotic, post-stroke 05/13/2010    Orientation RESPIRATION BLADDER Height & Weight     Self  Normal Incontinent Weight: 62.7 kg Height:     BEHAVIORAL SYMPTOMS/MOOD NEUROLOGICAL BOWEL NUTRITION STATUS      Incontinent Diet (mechanical soft)  AMBULATORY STATUS COMMUNICATION OF NEEDS Skin   Total Care Verbally Other (Comment) (Pressure injury, R buttocks)                       Personal Care Assistance Level of Assistance  Total care           Functional Limitations Info  Sight, Hearing, Speech Sight Info: Adequate Hearing Info: Adequate Speech Info: Adequate    SPECIAL CARE FACTORS FREQUENCY                       Contractures Contractures Info: Not present     Additional Factors Info  Code Status, Allergies Code Status Info: full Allergies Info: NKA           Current Medications (01/21/2021):  This is the current hospital active medication list Current Facility-Administered Medications  Medication Dose Route Frequency Provider Last Rate Last Admin   acetaminophen (TYLENOL) tablet 650 mg  650 mg Oral Q6H PRN Chotiner, Claudean Severance, MD       Or   acetaminophen (TYLENOL) suppository 650 mg  650 mg Rectal Q6H PRN Chotiner, Claudean Severance, MD       amitriptyline (ELAVIL) tablet 10 mg  10 mg Oral QHS Chotiner, Claudean Severance, MD       amLODipine (NORVASC) tablet 10 mg  10 mg Oral Daily Chotiner, Claudean Severance, MD   10 mg at 01/20/21 1205   amoxicillin-clavulanate (AUGMENTIN) 875-125 MG per tablet 1 tablet  1 tablet Oral Q12H Adhikari, Amrit, MD       ascorbic acid (VITAMIN C) tablet 250 mg  250 mg Oral BID Burnadette Pop, MD   250 mg at 01/20/21 1526   atorvastatin (LIPITOR) tablet 80 mg  80 mg Oral q1800 Chotiner, Claudean Severance, MD   80 mg at 01/20/21 1806   carvedilol (COREG) tablet 6.25 mg  6.25 mg Oral BID WC Chotiner, Claudean Severance, MD   6.25 mg at 01/20/21 1806   clopidogrel (PLAVIX) tablet 75 mg  75 mg Oral  Daily Chotiner, Claudean Severance, MD   75 mg at 01/20/21 1205   collagenase (SANTYL) ointment   Topical Daily Barnetta Chapel, PA-C   Given at 01/21/21 1226   enoxaparin (LOVENOX) injection 40 mg  40 mg Subcutaneous Q24H Chotiner, Claudean Severance, MD   40 mg at 01/20/21 1205   feeding supplement (ENSURE SURGERY) liquid 237 mL  237 mL Oral BID BM Adhikari, Amrit, MD   237 mL at 01/20/21 1526   gabapentin (NEURONTIN) capsule 100 mg  100 mg Oral TID Chotiner, Claudean Severance, MD   100 mg at 01/20/21 1530   HYDROmorphone (DILAUDID) injection 0.5 mg  0.5 mg Intravenous Q3H PRN Chotiner, Claudean Severance, MD       influenza vaccine adjuvanted (FLUAD) injection 0.5 mL  0.5 mL Intramuscular Tomorrow-1000 Burnadette Pop, MD       levETIRAcetam (KEPPRA) tablet 500 mg  500 mg Oral Q12H Chotiner,  Claudean Severance, MD   500 mg at 01/20/21 1205   levothyroxine (SYNTHROID) tablet 25 mcg  25 mcg Oral Q0600 Chotiner, Claudean Severance, MD   25 mcg at 01/20/21 5625   multivitamin with minerals tablet 1 tablet  1 tablet Oral Daily Burnadette Pop, MD   1 tablet at 01/20/21 1526   ondansetron (ZOFRAN) tablet 4 mg  4 mg Oral Q6H PRN Chotiner, Claudean Severance, MD       Or   ondansetron (ZOFRAN) injection 4 mg  4 mg Intravenous Q6H PRN Chotiner, Claudean Severance, MD       potassium chloride SA (KLOR-CON) CR tablet 80 mEq  80 mEq Oral Once Burnadette Pop, MD       senna-docusate (Senokot-S) tablet 1 tablet  1 tablet Oral QHS PRN Chotiner, Claudean Severance, MD         Discharge Medications: Please see discharge summary for a list of discharge medications.  Relevant Imaging Results:  Relevant Lab Results:   Additional Information    Ida Rogue, LCSW

## 2021-01-21 NOTE — Clinical Social Work Note (Cosign Needed)
    Durable Medical Equipment  (From admission, onward)           Start     Ordered   01/21/21 1432  For home use only DME Hospital bed  Once       Question Answer Comment  Length of Need Lifetime   The above medical condition requires: Patient requires the ability to reposition frequently   Bed type Semi-electric      01/21/21 1432

## 2021-01-21 NOTE — Progress Notes (Signed)
PT Cancellation Note  Patient Details Name: Carla Manning MRN: [id number] DOB: 03/13/1935   Cancelled Treatment:    Reason Eval/Treat Not Completed: PT screened, no needs identified, will sign off  Pt screened for mobility PT order.  Pt bedbound and requiring increased assist.  Pt not a rehab candidate per OT evaluation.  PT did assist with hydrotherapy today and educated daughter on dressing changes.  Please refer to PT hydrotherapy evaluation for more information.  Thomasene Mohair PT, DPT Acute Rehabilitation Services Pager: [phone] Office: [phone]    Carlyon Prows 01/21/2021, 4:15 PM

## 2021-01-21 NOTE — TOC Progression Note (Addendum)
Transition of Care [facility]) - Progression Note    Patient Details  Name: Carla Manning MRN: [id number] Date of Birth: 1935/04/04  [facility] CM/SW Contact  Ida Rogue, [place] Phone Number: 01/21/2021, 12:25 PM  Clinical Narrative:   Spoke with daughter Carla Manning, who reports that she got clarification that in fact her mother does not have Long Term Care insurance.  With that information, she is resigned to taking her mother home.  She is planning on starting the MCD application process over again, and asked if MD here would sign FL2 for her to submit.  MD messaged about this.  Will plan on setting them up with full array of [facility] services, as well as hospital bed, and PTAR transportation home. TOC will continue to follow during the course of hospitalization.  Addendum: Bed delivery not possible until Saturday, or Monday at the latest.  Family needs bed for return home as they are unable to move her safely themselves. [facility] will contact hospital to let them know when bed has been delivered  Addendum II: Cindie with [facility] agrees to provide [facility] services. May Anne with [facility] took referral for palliative services     Expected Discharge Plan: Home w Home Health Services Barriers to Discharge: No Barriers Identified  Expected Discharge Plan and Services Expected Discharge Plan: Home w Home Health Services   Discharge Planning Services: CM Consult Post Acute Care Choice: Home Health Living arrangements for the past 2 months: Single Family Home                                       Social Determinants of Health (SDOH) Interventions    Readmission Risk Interventions No flowsheet data found.

## 2021-01-21 NOTE — Progress Notes (Signed)
[facility] ([facility])  Hospital Liaison: RN note         This patient has been referred to our palliative care services in the community.  [facility] will continue to follow for any discharge planning needs and to coordinate continuation of palliative care in the outpatient setting.    If you have questions or need assistance, please call [phone] or contact the hospital Liaison listed on AMION.      Thank you for this referral.         Mary Anne Robertson, RN, CCM  [facility] Hospital Liaison   [phone] 

## 2021-01-21 NOTE — Progress Notes (Signed)
Physical Therapy Wound Evaluation Patient Details  Name: Carla Manning MRN: [id number] Date of Birth: Jul 31, 1934  Today's Date: 01/21/2021 Time: 4098-1191 Time Calculation (min): 36 min  Subjective  Subjective Assessment Subjective: 86 year old female with history of stroke x2, bedbound, right hemiparesis, advanced dementia, hypertension, seizure disorder who was brought out to the hospital by her daughter because she could not take care of her anymore and she has noticed a pressure ulcer on her right sacral area.   Pt's family in room for hydrotherapy session and agreeable to treatment.  Pain Score:  some moaning, however daughter stayed at pt's side and assisted with soothing and distracting pt  Wound Assessment                                                               Pressure Injury 01/21/21 Sacrum Right Stage 4 - Full thickness tissue loss with exposed bone, tendon or muscle. PT Hydro - right sacral pressure injury (Active)  Wound Image   01/21/21 1500  Dressing Type ABD;Barrier Film (skin prep);Normal saline moist dressing;Moist to dry 01/21/21 1500  Dressing Changed 01/21/21 1500  Dressing Change Frequency Twice a day 01/21/21 1500  State of Healing Eschar 01/21/21 1500  Site / Wound Assessment Bleeding;Brown;Yellow 01/21/21 1500  % Wound base Yellow/Fibrinous Exudate 75% 01/21/21 1500  % Wound base Black/Eschar 25% 01/21/21 1500  Peri-wound Assessment Erythema (non-blanchable) 01/21/21 1500  Wound Length (cm) 4 cm 01/21/21 1500  Wound Width (cm) 5 cm 01/21/21 1500  Wound Depth (cm) 2.5 cm 01/21/21 1500  Wound Surface Area (cm^2) 20 cm^2 01/21/21 1500  Wound Volume (cm^3) 50 cm^3 01/21/21 1500  Drainage Amount Moderate 01/21/21 1500  Drainage Description Purulent;Serosanguineous;Odor 01/21/21 1500  Treatment Debridement (Selective);Hydrotherapy (Pulse lavage);Off loading;Packing (Saline gauze) 01/21/21 1500   Hydrotherapy Pulsed lavage therapy - wound  location: right sacral pressure injury Pulsed Lavage with Suction (psi): 8 psi Pulsed Lavage with Suction - Normal Saline Used: 1000 mL Pulsed Lavage Tip: Tip with splash shield Selective Debridement Selective Debridement - Location: right sacral pressure injury Selective Debridement - Tools Used: Forceps, Scissors Selective Debridement - Tissue Removed: black necrotic tissue, yellow/tan slough    Wound Assessment and Plan  Wound Therapy - Assess/Plan/Recommendations Wound Therapy - Clinical Statement: Pt had right buttocks pressure injury surically debrided 01/20/21.  Pt continues to have necrotic tissue in wound and performed pulsatilve lavage and conservative sharp debridement.  Pt's daughters present and one daughter educated on safe removal and application of dressing including skin prep, fluffling gauze, and packing.  Pt likely to d/c home with family today however if remains, will continue to assist with wound management. Wound Therapy - Functional Problem List: dementia, bedbound, not currently eating Factors Delaying/Impairing Wound Healing: Immobility, Diabetes Mellitus, Incontinence Hydrotherapy Plan: Debridement, Dressing change, Patient/family education, Pulsatile lavage with suction Wound Therapy - Frequency: 6X / week Wound Therapy - Follow Up Recommendations: dressing changes by RN, dressing changes by family/patient  Wound Therapy Goals- Improve the function of patient's integumentary system by progressing the wound(s) through the phases of wound healing (inflammation - proliferation - remodeling) by: Wound Therapy Goals - Improve the function of patient's integumentary system by progressing the wound(s) through the phases of wound healing by: Decrease Necrotic Tissue to: 40 Decrease Necrotic Tissue - Progress:  Progressing toward goal Increase Granulation Tissue to: 60 Increase Granulation Tissue - Progress: Progressing toward goal Improve Drainage Characteristics:  Min Improve Drainage Characteristics - Progress: Progressing toward goal Patient/Family will be able to : demonstrate dressing change Patient/Family Instruction Goal - Progress: Progressing toward goal Time For Goal Achievement: 2 weeks Wound Therapy - Potential for Goals: Fair  Goals will be updated until maximal potential achieved or discharge criteria met.  Discharge criteria: when goals achieved, discharge from hospital, MD decision/surgical intervention, no progress towards goals, refusal/missing three consecutive treatments without notification or medical reason.  GP     Charges PT Wound Care Charges $Wound Debridement up to 20 cm: < or equal to 20 cm $PT PLS Gun and Tip: 1 Supply $PT Hydrotherapy Visit: 1 Visit     Kati PT, DPT Acute Rehabilitation Services Pager: [phone] Office: [place] 01/21/2021, 4:13 PM

## 2021-01-21 NOTE — Clinical Social Work Note (Signed)
    Durable Medical Equipment  (From admission, onward)           Start     Ordered   01/21/21 1250  For home use only DME Hospital bed  Once       Question Answer Comment  Length of Need Lifetime   Bed type Semi-electric      01/21/21 1250

## 2021-01-22 MED ORDER — NYSTATIN 100000 UNIT/GM EX POWD: Freq: Two times a day (BID) | CUTANEOUS | 1 refills | Status: AC

## 2021-01-22 NOTE — Progress Notes (Signed)
Patient seen and examined at the bedside this morning.  She looked comfortable, hemodynamically stable.  Daughters were at the bedside.  There is no change in the medical management .  Discharge orders and summary were placed on 01/21/2021.  She is waiting for hospital bed to be delivered at home.  She is medically stable for discharge soon as possible

## 2021-01-22 NOTE — Progress Notes (Signed)
Morning medication scanned in, patient refusing to take crushed pills in apple sauce. Will continue to try and give medication

## 2021-01-22 NOTE — Progress Notes (Signed)
Bladder scan performed- 499 mL. MD paged. New order for In & out cath. Cath performed 450 mL removed.

## 2021-01-22 NOTE — TOC Progression Note (Signed)
Per [facility] they are waiting on approval from pts secondary insurance. This office is not open until Monday so it is likely the bed will not be delivered until Monday.

## 2021-01-22 NOTE — Progress Notes (Signed)
Patient requires frequent re-positioning of the body in ways that cannot be achieved with an ordinary bed or wedge pillow, to eliminate pain, reduce pressure, and the head of the bed to be elevated more than 30 degrees most of the time due to decubitus ulcer of sacral area.

## 2021-01-22 NOTE — Progress Notes (Signed)
PHYSICAL THERAPY HYDROTHERAPY TREATMENT NOTE      01/22/21 1600  Subjective Assessment  Subjective  (pt does cry out with performance of hydrotherapy)  Evaluation and Treatment  Evaluation and Treatment Procedures Explained to Patient/Family Yes  Evaluation and Treatment Procedures Patient unable to consent due to mental status (no family present on today)  Pressure Injury 01/21/21 Sacrum Right Stage 4 - Full thickness tissue loss with exposed bone, tendon or muscle. PT Hydro - right sacral pressure injury  Date First Assessed/Time First Assessed: 01/21/21 0400   Location: Sacrum  Location Orientation: Right  Staging: Stage 4 - Full thickness tissue loss with exposed bone, tendon or muscle.  Wound Description (Comments): PT Hydro - right sacral pressure ...  Dressing Type ABD;Barrier Film (skin prep);Normal saline moist dressing;Santyl  Dressing Changed  Dressing Change Frequency Twice a day  State of Healing Eschar  Site / Wound Assessment Bleeding;Brown;Yellow;Pink  % Wound base Red or Granulating 20%  % Wound base Yellow/Fibrinous Exudate 40%  % Wound base Black/Eschar 40%  Undermining (cm)  (undermining @4 :00; 9-12:00)  Margins Unattached edges (unapproximated)  Drainage Amount Moderate  Drainage Description Odor;Serosanguineous  Treatment Debridement (Selective);Hydrotherapy (Pulse lavage);Packing (Saline gauze) (Enzymatic debridement)  Hydrotherapy  Pulsed Lavage with Suction (psi) 8 psi  Pulsed Lavage with Suction - Normal Saline Used 1000 mL  Pulsed lavage therapy - wound location right sacral pressure injury  Wound Therapy - Assess/Plan/Recommendations  Wound Therapy - Clinical Statement Pt had right buttocks pressure injury surgically debrided 01/20/21.  Pt continues to have necrotic tissue in wound-continued pulsed lavage on today. Pt likely to d/c home with family on Monday (awaiting DME delivery). Will continue to assist with wound management.  Wound Therapy -  Functional Problem List dementia, bedbound, poor nutrition status  Factors Delaying/Impairing Wound Healing Immobility;Diabetes Mellitus;Incontinence  Hydrotherapy Plan Debridement;Dressing change;Patient/family education;Pulsatile lavage with suction  Wound Therapy - Frequency 6X / week  Wound Therapy - Follow Up Recommendations dressing changes by RN;dressing changes by family/patient  Wound Therapy Goals - Improve the function of patient's integumentary system by progressing the wound(s) through the phases of wound healing by:  Decrease Necrotic Tissue to 40  Decrease Necrotic Tissue - Progress Progressing toward goal  Increase Granulation Tissue to 60  Increase Granulation Tissue - Progress Progressing toward goal  Improve Drainage Characteristics Min  Improve Drainage Characteristics - Progress Progressing toward goal  Time For Goal Achievement 2 weeks  Wound Therapy - Potential for Goals Friday, PT Acute Rehabilitation  Office: [phone] Pager: [phone]

## 2021-01-23 LAB — BASIC METABOLIC PANEL
Anion gap: 9 (ref 5–15)
BUN: 6 mg/dL — ABNORMAL LOW (ref 8–23)
CO2: 30 mmol/L (ref 22–32)
Calcium: 8.5 mg/dL — ABNORMAL LOW (ref 8.9–10.3)
Chloride: 97 mmol/L — ABNORMAL LOW (ref 98–111)
Creatinine, Ser: 0.63 mg/dL (ref 0.44–1.00)
GFR, Estimated: >60 mL/min (ref >60)
Glucose, Bld: 134 mg/dL — ABNORMAL HIGH (ref 70–99)
Potassium: 3.2 mmol/L — ABNORMAL LOW (ref 3.5–5.1)
Sodium: 136 mmol/L (ref 135–145)

## 2021-01-23 MED ORDER — HYDROCODONE-ACETAMINOPHEN 5-325 MG PO TABS
2.0000 | ORAL_TABLET | Freq: Four times a day (QID) | ORAL | Status: AC | PRN
Start: 2021-01-23 — End: 2021-01-25
  Filled 2021-01-23: qty 2

## 2021-01-23 NOTE — Progress Notes (Signed)
PROGRESS NOTE    Carla Manning  YQM:[id number] DOB: 1934-05-02 DOA: 01/19/2021 PCP: Pcp, No   Chief Complain: Pressure ulcer  Brief Narrative:  Patient is a 86 year old female with history of stroke x2, bedbound, right hemiparesis, advanced dementia, hypertension, seizure disorder who was brought out to the hospital by her daughter because she could not take care of her anymore and she has noticed a pressure ulcer on her right sacral area.  Patient is nonambulatory and has been bedbound.  Daughter noticed the ulcer about a month ago.  No report of fever, chills at home.  On presentation she was hemodynamically stable.  She was found to have large sacral decubitus ulcer on her right buttock about 4 to 5 cm in diameter with foul-smelling discharge.  CT scan revealed gas in the region consistent with cellulitis.  Wound care was consulted who recommended general surgery saw the patient, does not feel that wound is infected and did not recommend debridement or antibiotics.  Antibiotics changed to oral today.  Since patient is bedbound, nonambulatory, nursing facility placement looked challenging and difficult so patient is being discharged to home with home health.She is waiting for hospital bed to be delivered.   Assessment & Plan:   Principal Problem:   Decubitus ulcer of sacral area Active Problems:   Hypothyroidism   Essential hypertension   History of CVA with residual deficit   Dementia (HCC)   Right sacral decubitus ulcer: Found to have a sacral decubitus ulcer on the upper right buttock.  Wound care consulted.  Size of about 4X5X 2 cm.  Wound care recommended consideration of general consult for possible debridement at bedside.  Wound was foul-smelling. CT showed right parasacral decubitus ulceration with stranding and gas in the underlying subcutaneous fatty tissues , consistent with cellulitis. No discrete abscess identified. No CT findings to suggest fistula or  osteomyelitis. General surgery consulted,oes not feel that wound is infected and did not recommend debridement or antibiotics.  Antibiotics changed to oral.   She is afebrile, she does not have leukocytosis. She is undergoing hydrotherapy   Hypertension: Continue current medications.     Hypothyroidism: Continue levothyroxine   Hyperlipidemia: On statin   Dementia: Confused at baseline.  Unable to ambulate.  Feeds by herself though.   History of CVA: Has chronic right hemiplegia.  Bedbound.  On Plavix.   History of seizure disorder: On Keppra   Hypokalemia: Supplemented aggressively.  Continue supplementation at home.  Check BMP in a week   Disposition: Lives with daughter. She wanted her to be placed in a facility. Since patient is bedbound, nonambulatory, nursing facility placement looked challenging and difficult so patient is being discharged to home with home health.  We recommend follow-up with palliative care as an outpatient.  Nutrition Problem: Increased nutrient needs Etiology: wound healing      DVT prophylaxis:Lovenox Code Status: Full Family Communication: Daughter at bedside Status is: Inpatient  Remains inpatient appropriate because:IV treatments appropriate due to intensity of illness or inability to take PO  Dispo: The patient is from: Home              Anticipated d/c is to: SNF              Patient currently is not medically stable to d/c.   Difficult to place patient Yes      Consultants: Surgery  Procedures: None  Antimicrobials:  Anti-infectives (From admission, onward)    Start     Dose/Rate Route Frequency  Ordered Stop   01/21/21 2000  vancomycin (VANCOREADY) IVPB 1500 mg/300 mL  Status:  Discontinued        1,500 mg 150 mL/hr over 120 Minutes Intravenous Every 48 hours 01/20/21 0126 01/20/21 0150   01/21/21 1200  vancomycin (VANCOREADY) IVPB 750 mg/150 mL  Status:  Discontinued        750 mg 150 mL/hr over 60 Minutes Intravenous Every  36 hours 01/20/21 0150 01/20/21 1655   01/21/21 0000  amoxicillin-clavulanate (AUGMENTIN) 875-125 MG tablet        1 tablet Oral 2 times daily 01/21/21 1316 01/27/21 2359   01/20/21 2200  amoxicillin-clavulanate (AUGMENTIN) 875-125 MG per tablet 1 tablet        1 tablet Oral Every 12 hours 01/20/21 1655 01/27/21 2159   01/20/21 1400  ceFEPIme (MAXIPIME) 2 g in sodium chloride 0.9 % 100 mL IVPB  Status:  Discontinued        2 g 200 mL/hr over 30 Minutes Intravenous Every 12 hours 01/20/21 1214 01/20/21 1655   01/19/21 2230  vancomycin (VANCOCIN) IVPB 1000 mg/200 mL premix        1,000 mg 200 mL/hr over 60 Minutes Intravenous  Once 01/19/21 2218 01/20/21 0021       Subjective:  Patient seen and examined at the bedside this morning.  Hemodynamically stable.  Without any complaints.  Comfortable, lying in bed.  Daughter at bedside  Objective: Vitals:   01/22/21 0603 01/22/21 1317 01/22/21 2044 01/23/21 0511  BP: 119/82 109/65 117/76 117/66  Pulse: 87 78 73 94  Resp: 20 18 12 16   Temp: 98.4 F (36.9 C) (!) 97.4 F (36.3 C) (!) 97.5 F (36.4 C) 97.7 F (36.5 C)  TempSrc: Axillary  Oral Oral  SpO2: 100% 98% 98% 99%  Weight:        Intake/Output Summary (Last 24 hours) at 01/23/2021 1048 Last data filed at 01/22/2021 2329 Gross per 24 hour  Intake --  Output 450 ml  Net -450 ml   Filed Weights   01/20/21 0133  Weight: 62.7 kg    Examination:   General exam: Very deconditioned, overall comfortable HEENT: PERRL Respiratory system:  no wheezes or crackles  Cardiovascular system: S1 & S2 heard, RRR.  Gastrointestinal system: Abdomen is nondistended, soft and nontender. Central nervous system: Alert and awake but not oriented Extremities: No edema, no clubbing ,no cyanosis Skin: sacral decubitus ulcer    Data Reviewed: I have personally reviewed following labs and imaging studies  CBC: Recent Labs  Lab 01/19/21 2100 01/20/21 0522  WBC 9.8 8.5  NEUTROABS 8.1*  --    HGB 13.0 12.6  HCT 36.9 35.2*  MCV 77.0* 76.7*  PLT 285 246   Basic Metabolic Panel: Recent Labs  Lab 01/19/21 1928 01/20/21 0522 01/21/21 0501  NA 135 136 135  K 3.8 3.3* 2.8*  CL 92* 96* 93*  CO2 27 28 29   GLUCOSE 112* 103* 137*  BUN 13 13 10   CREATININE 0.99 0.95 0.76  CALCIUM 8.8* 8.6* 8.5*   GFR: Estimated Creatinine Clearance: 42.1 mL/min (by C-G formula based on SCr of 0.76 mg/dL). Liver Function Tests: Recent Labs  Lab 01/19/21 1928  AST 39  ALT 18  ALKPHOS 125  BILITOT 1.2  PROT 8.8*  ALBUMIN 3.1*   Recent Labs  Lab 01/19/21 1928  LIPASE 18   No results for input(s): AMMONIA in the last 168 hours. Coagulation Profile: No results for input(s): INR, PROTIME in the last 168 hours.  Cardiac Enzymes: No results for input(s): CKTOTAL, CKMB, CKMBINDEX, TROPONINI in the last 168 hours. BNP (last 3 results) No results for input(s): PROBNP in the last 8760 hours. HbA1C: No results for input(s): HGBA1C in the last 72 hours. CBG: No results for input(s): GLUCAP in the last 168 hours. Lipid Profile: No results for input(s): CHOL, HDL, LDLCALC, TRIG, CHOLHDL, LDLDIRECT in the last 72 hours. Thyroid Function Tests: No results for input(s): TSH, T4TOTAL, FREET4, T3FREE, THYROIDAB in the last 72 hours.  Anemia Panel: No results for input(s): VITAMINB12, FOLATE, FERRITIN, TIBC, IRON, RETICCTPCT in the last 72 hours. Sepsis Labs: Recent Labs  Lab 01/19/21 1928  LATICACIDVEN 1.9    Recent Results (from the past 240 hour(s))  Blood culture (routine x 2)     Status: None (Preliminary result)   Collection Time: 01/19/21  6:58 PM   Specimen: BLOOD LEFT FOREARM  Result Value Ref Range Status   Specimen Description   Final    BLOOD LEFT FOREARM Performed at [facility], [address], [place], [place] [address]    Special Requests   Final    BOTTLES DRAWN AEROBIC ONLY Blood Culture results may not be optimal due to an inadequate volume of  blood received in culture bottles Performed at [facility], [address] [place], [place] [address]    Culture   Final    NO GROWTH 3 DAYS Performed at [facility], [address], [place], [place] [address]    Report Status PENDING  Incomplete  Blood culture (routine x 2)     Status: None (Preliminary result)   Collection Time: 01/19/21  7:28 PM   Specimen: BLOOD  Result Value Ref Range Status   Specimen Description   Final    BLOOD LEFT ANTECUBITAL Performed at [facility], [address], [place], [place] [address]    Special Requests   Final    BOTTLES DRAWN AEROBIC AND ANAEROBIC Blood Culture results may not be optimal due to an inadequate volume of blood received in culture bottles Performed at [facility], [address] [place], [place] [address]    Culture   Final    NO GROWTH 3 DAYS Performed at [facility], [address], [place], [place] [address]    Report Status PENDING  Incomplete  Resp Panel by RT-PCR (Flu A&B, Covid) Nasopharyngeal Swab     Status: None   Collection Time: 01/19/21 11:22 PM   Specimen: Nasopharyngeal Swab; Nasopharyngeal(NP) swabs in vial transport medium  Result Value Ref Range Status   SARS Coronavirus 2 by RT PCR NEGATIVE NEGATIVE Final    Comment: (NOTE) SARS-CoV-2 target nucleic acids are NOT DETECTED.  The SARS-CoV-2 RNA is generally detectable in upper respiratory specimens during the acute phase of infection. The lowest concentration of SARS-CoV-2 viral copies this assay can detect is 138 copies/mL. A negative result does not preclude SARS-Cov-2 infection and should not be used as the sole basis for treatment or other patient management decisions. A negative result may occur with  improper specimen collection/handling, submission of specimen other than nasopharyngeal swab, presence of viral mutation(s) within the areas targeted by this assay, and inadequate  number of viral copies(<138 copies/mL). A negative result must be combined with clinical observations, patient history, and epidemiological information. The expected result is Negative.  Fact Sheet for Patients:  BloggerCourse.com  Fact Sheet for Healthcare Providers:  SeriousBroker.it  This test is no t  yet approved or cleared by the [place] and  has been authorized for detection and/or diagnosis of SARS-CoV-2 by FDA under an Emergency Use Authorization (EUA). This EUA will remain  in effect (meaning this test can be used) for the duration of the COVID-19 declaration under Section 564(b)(1) of the Act, 21 U.S.C.section 360bbb-3(b)(1), unless the authorization is terminated  or revoked sooner.       Influenza A by PCR NEGATIVE NEGATIVE Final   Influenza B by PCR NEGATIVE NEGATIVE Final    Comment: (NOTE) The Xpert Xpress SARS-CoV-2/FLU/RSV plus assay is intended as an aid in the diagnosis of influenza from Nasopharyngeal swab specimens and should not be used as a sole basis for treatment. Nasal washings and aspirates are unacceptable for Xpert Xpress SARS-CoV-2/FLU/RSV testing.  Fact Sheet for Patients: BloggerCourse.com  Fact Sheet for Healthcare Providers: SeriousBroker.it  This test is not yet approved or cleared by the [place] FDA and has been authorized for detection and/or diagnosis of SARS-CoV-2 by FDA under an Emergency Use Authorization (EUA). This EUA will remain in effect (meaning this test can be used) for the duration of the COVID-19 declaration under Section 564(b)(1) of the Act, 21 U.S.C. section 360bbb-3(b)(1), unless the authorization is terminated or revoked.  Performed at [facility], [address], [place], [place] [address]          Radiology Studies: No results found.      Scheduled Meds:   amitriptyline  10 mg Oral QHS   amLODipine  10 mg Oral Daily   amoxicillin-clavulanate  1 tablet Oral Q12H   vitamin C  250 mg Oral BID   atorvastatin  80 mg Oral q1800   carvedilol  6.25 mg Oral BID WC   clopidogrel  75 mg Oral Daily   collagenase   Topical Daily   enoxaparin (LOVENOX) injection  40 mg Subcutaneous Q24H   feeding supplement  237 mL Oral BID BM   gabapentin  100 mg Oral TID   influenza vaccine adjuvanted  0.5 mL Intramuscular Tomorrow-1000   levETIRAcetam  500 mg Oral Q12H   levothyroxine  25 mcg Oral Q0600   multivitamin with minerals  1 tablet Oral Daily   nystatin   Topical BID   Continuous Infusions:     LOS: 4 days    Time spent: 25 mins.More than 50% of that time was spent in counseling and/or coordination of care.      Burnadette Pop, MD [facility] P9/25/2022, 10:48 AM

## 2021-01-24 LAB — CULTURE, BLOOD (ROUTINE X 2)
Culture: NO GROWTH
Culture: NO GROWTH

## 2021-01-24 MED ORDER — HYDROCODONE-ACETAMINOPHEN 5-325 MG PO TABS: 1.0000 | ORAL_TABLET | Freq: Four times a day (QID) | ORAL | 0 refills | Status: AC | PRN

## 2021-01-24 MED ORDER — POTASSIUM CHLORIDE CRYS ER 20 MEQ PO TBCR
40.0000 meq | EXTENDED_RELEASE_TABLET | Freq: Once | ORAL | Status: DC
  Filled 2021-01-24: qty 2

## 2021-01-24 NOTE — Progress Notes (Signed)
PROGRESS NOTE    Carla Manning  ASN:[id number] DOB: [birth year] DOA: 01/19/2021 PCP: Pcp, No   Chief Complain: Pressure ulcer  Brief Narrative:  Patient is a 86 year old female with history of stroke x2, bedbound, right hemiparesis, advanced dementia, hypertension, seizure disorder who was brought out to the hospital by her daughter because she could not take care of her anymore and she has noticed a pressure ulcer on her right sacral area.  Patient is nonambulatory and has been bedbound.  Daughter noticed the ulcer about a month ago.  No report of fever, chills at home.  On presentation she was hemodynamically stable.  She was found to have large sacral decubitus ulcer on her right buttock about 4 to 5 cm in diameter with foul-smelling discharge.  CT scan revealed gas in the region consistent with cellulitis.  Wound care was consulted who recommended general surgery saw the patient, does not feel that wound is infected and did not recommend debridement or antibiotics.  Antibiotics changed to oral today.  Since patient is bedbound, nonambulatory, nursing facility placement looked challenging and difficult so patient is being discharged to home with home health today.  Assessment & Plan:   Principal Problem:   Decubitus ulcer of sacral area Active Problems:   Hypothyroidism   Essential hypertension   History of CVA with residual deficit   Dementia (HCC)   Right sacral decubitus ulcer: Found to have a sacral decubitus ulcer on the upper right buttock.  Wound care consulted.  Size of about 4X5X 2 cm.  Wound care recommended consideration of general consult for possible debridement at bedside.  Wound was foul-smelling. CT showed right parasacral decubitus ulceration with stranding and gas in the underlying subcutaneous fatty tissues , consistent with cellulitis. No discrete abscess identified. No CT findings to suggest fistula or osteomyelitis. General surgery consulted,oes not feel that wound  is infected and did not recommend debridement or antibiotics.  Antibiotics changed to oral.   She is afebrile, she does not have leukocytosis. She was undergoing hydrotherapy here by PT   Hypertension: Continue current medications.     Hypothyroidism: Continue levothyroxine   Hyperlipidemia: On statin   Dementia: Confused at baseline.  Unable to ambulate.  Feeds by herself though.   History of CVA: Has chronic right hemiplegia.  Bedbound.  On Plavix.   History of seizure disorder: On Keppra   Hypokalemia: Supplemented aggressively.  Continue supplementation at home.  Check BMP in a week   Disposition: Lives with daughter. She wanted her to be placed in a facility. Since patient is bedbound, nonambulatory, nursing facility placement looked challenging and difficult so patient is being discharged to home with home health.  We recommend follow-up with palliative care as an outpatient.  Nutrition Problem: Increased nutrient needs Etiology: wound healing      DVT prophylaxis:Lovenox Code Status: Full Family Communication: Daughter at bedside on 9.25.22 Status is: Inpatient  Remains inpatient appropriate because:IV treatments appropriate due to intensity of illness or inability to take PO  Dispo: The patient is from: Home              Anticipated d/c is to: Home with home health              Patient currently  is medically stable for discharge to home today   Difficult to place patient Yes      Consultants: Surgery  Procedures: None  Antimicrobials:  Anti-infectives (From admission, onward)    Start  Dose/Rate Route Frequency Ordered Stop   01/21/21 2000  vancomycin (VANCOREADY) IVPB 1500 mg/300 mL  Status:  Discontinued        1,500 mg 150 mL/hr over 120 Minutes Intravenous Every 48 hours 01/20/21 0126 01/20/21 0150   01/21/21 1200  vancomycin (VANCOREADY) IVPB 750 mg/150 mL  Status:  Discontinued        750 mg 150 mL/hr over 60 Minutes Intravenous Every 36 hours  01/20/21 0150 01/20/21 1655   01/21/21 0000  amoxicillin-clavulanate (AUGMENTIN) 875-125 MG tablet        1 tablet Oral 2 times daily 01/21/21 1316 01/27/21 2359   01/20/21 2200  amoxicillin-clavulanate (AUGMENTIN) 875-125 MG per tablet 1 tablet        1 tablet Oral Every 12 hours 01/20/21 1655 01/27/21 2159   01/20/21 1400  ceFEPIme (MAXIPIME) 2 g in sodium chloride 0.9 % 100 mL IVPB  Status:  Discontinued        2 g 200 mL/hr over 30 Minutes Intravenous Every 12 hours 01/20/21 1214 01/20/21 1655   01/19/21 2230  vancomycin (VANCOCIN) IVPB 1000 mg/200 mL premix        1,000 mg 200 mL/hr over 60 Minutes Intravenous  Once 01/19/21 2218 01/20/21 0021       Subjective:  Patient seen and examined at the bedside this morning.  Hemodynamically stable.  Being discharged today to home.  Comfortable  Objective: Vitals:   01/23/21 0511 01/23/21 1351 01/23/21 2053 01/24/21 0518  BP: 117/66 108/65 136/85 135/81  Pulse: 94 73 82 90  Resp: 16 17 20 20   Temp: 97.7 F (36.5 C) 98.2 F (36.8 C) (!) 97.3 F (36.3 C) 98 F (36.7 C)  TempSrc: Oral  Oral Oral  SpO2: 99% 96% 96% 97%  Weight:        Intake/Output Summary (Last 24 hours) at 01/24/2021 0952 Last data filed at 01/24/2021 0700 Gross per 24 hour  Intake 240 ml  Output 0 ml  Net 240 ml   Filed Weights   01/20/21 0133  Weight: 62.7 kg    Examination:   General exam: Very deconditioned, overall comfortable HEENT: PERRL Respiratory system:  no wheezes or crackles  Cardiovascular system: S1 & S2 heard, RRR.  Gastrointestinal system: Abdomen is nondistended, soft and nontender. Central nervous system: Alert and awake but not oriented Extremities: No edema, no clubbing ,no cyanosis Skin: sacral decubitus ulcer    Data Reviewed: I have personally reviewed following labs and imaging studies  CBC: Recent Labs  Lab 01/19/21 2100 01/20/21 0522  WBC 9.8 8.5  NEUTROABS 8.1*  --   HGB 13.0 12.6  HCT 36.9 35.2*  MCV 77.0*  76.7*  PLT 285 246   Basic Metabolic Panel: Recent Labs  Lab 01/19/21 1928 01/20/21 0522 01/21/21 0501 01/23/21 1021  NA 135 136 135 136  K 3.8 3.3* 2.8* 3.2*  CL 92* 96* 93* 97*  CO2 27 28 29 30   GLUCOSE 112* 103* 137* 134*  BUN 13 13 10  6*  CREATININE 0.99 0.95 0.76 0.63  CALCIUM 8.8* 8.6* 8.5* 8.5*   GFR: Estimated Creatinine Clearance: 42.1 mL/min (by C-G formula based on SCr of 0.63 mg/dL). Liver Function Tests: Recent Labs  Lab 01/19/21 1928  AST 39  ALT 18  ALKPHOS 125  BILITOT 1.2  PROT 8.8*  ALBUMIN 3.1*   Recent Labs  Lab 01/19/21 1928  LIPASE 18   No results for input(s): AMMONIA in the last 168 hours. Coagulation Profile: No results for  input(s): INR, PROTIME in the last 168 hours. Cardiac Enzymes: No results for input(s): CKTOTAL, CKMB, CKMBINDEX, TROPONINI in the last 168 hours. BNP (last 3 results) No results for input(s): PROBNP in the last 8760 hours. HbA1C: No results for input(s): HGBA1C in the last 72 hours. CBG: No results for input(s): GLUCAP in the last 168 hours. Lipid Profile: No results for input(s): CHOL, HDL, LDLCALC, TRIG, CHOLHDL, LDLDIRECT in the last 72 hours. Thyroid Function Tests: No results for input(s): TSH, T4TOTAL, FREET4, T3FREE, THYROIDAB in the last 72 hours.  Anemia Panel: No results for input(s): VITAMINB12, FOLATE, FERRITIN, TIBC, IRON, RETICCTPCT in the last 72 hours. Sepsis Labs: Recent Labs  Lab 01/19/21 1928  LATICACIDVEN 1.9    Recent Results (from the past 240 hour(s))  Blood culture (routine x 2)     Status: None (Preliminary result)   Collection Time: 01/19/21  6:58 PM   Specimen: BLOOD LEFT FOREARM  Result Value Ref Range Status   Specimen Description   Final    BLOOD LEFT FOREARM Performed at [facility], [address], [place], [place] [address]    Special Requests   Final    BOTTLES DRAWN AEROBIC ONLY Blood Culture results may not be optimal due to an inadequate volume  of blood received in culture bottles Performed at [facility], [address], [place], [place] [address]    Culture   Final    NO GROWTH 4 DAYS Performed at [facility], [address], [place], [place] [address]    Report Status PENDING  Incomplete  Blood culture (routine x 2)     Status: None (Preliminary result)   Collection Time: 01/19/21  7:28 PM   Specimen: BLOOD  Result Value Ref Range Status   Specimen Description   Final    BLOOD LEFT ANTECUBITAL Performed at [facility], [address] [place], [place] [address]    Special Requests   Final    BOTTLES DRAWN AEROBIC AND ANAEROBIC Blood Culture results may not be optimal due to an inadequate volume of blood received in culture bottles Performed at [facility], [address], [place], [place] [address]    Culture   Final    NO GROWTH 4 DAYS Performed at [facility], [address] [place], [place] [address]    Report Status PENDING  Incomplete  Resp Panel by RT-PCR (Flu A&B, Covid) Nasopharyngeal Swab     Status: None   Collection Time: 01/19/21 11:22 PM   Specimen: Nasopharyngeal Swab; Nasopharyngeal(NP) swabs in vial transport medium  Result Value Ref Range Status   SARS Coronavirus 2 by RT PCR NEGATIVE NEGATIVE Final    Comment: (NOTE) SARS-CoV-2 target nucleic acids are NOT DETECTED.  The SARS-CoV-2 RNA is generally detectable in upper respiratory specimens during the acute phase of infection. The lowest concentration of SARS-CoV-2 viral copies this assay can detect is 138 copies/mL. A negative result does not preclude SARS-Cov-2 infection and should not be used as the sole basis for treatment or other patient management decisions. A negative result may occur with  improper specimen collection/handling, submission of specimen other than nasopharyngeal swab, presence of viral mutation(s) within the areas targeted by this assay, and  inadequate number of viral copies(<138 copies/mL). A negative result must be combined with clinical observations, patient history, and epidemiological information. The expected result is Negative.  Fact Sheet for Patients:  BloggerCourse.com  Fact Sheet for Healthcare Providers:  SeriousBroker.it  This test is no t yet approved or cleared by the [place] and  has been authorized for detection and/or diagnosis of SARS-CoV-2 by FDA under an Emergency Use Authorization (EUA). This EUA will remain  in effect (meaning this test can be used) for the duration of the COVID-19 declaration under Section 564(b)(1) of the Act, 21 U.S.C.section 360bbb-3(b)(1), unless the authorization is terminated  or revoked sooner.       Influenza A by PCR NEGATIVE NEGATIVE Final   Influenza B by PCR NEGATIVE NEGATIVE Final    Comment: (NOTE) The Xpert Xpress SARS-CoV-2/FLU/RSV plus assay is intended as an aid in the diagnosis of influenza from Nasopharyngeal swab specimens and should not be used as a sole basis for treatment. Nasal washings and aspirates are unacceptable for Xpert Xpress SARS-CoV-2/FLU/RSV testing.  Fact Sheet for Patients: BloggerCourse.com  Fact Sheet for Healthcare Providers: SeriousBroker.it  This test is not yet approved or cleared by the [place] FDA and has been authorized for detection and/or diagnosis of SARS-CoV-2 by FDA under an Emergency Use Authorization (EUA). This EUA will remain in effect (meaning this test can be used) for the duration of the COVID-19 declaration under Section 564(b)(1) of the Act, 21 U.S.C. section 360bbb-3(b)(1), unless the authorization is terminated or revoked.  Performed at [facility], [address], [place], [place] [address]          Radiology Studies: No results found.      Scheduled Meds:   amitriptyline  10 mg Oral QHS   amLODipine  10 mg Oral Daily   amoxicillin-clavulanate  1 tablet Oral Q12H   vitamin C  250 mg Oral BID   atorvastatin  80 mg Oral q1800   carvedilol  6.25 mg Oral BID WC   clopidogrel  75 mg Oral Daily   collagenase   Topical Daily   enoxaparin (LOVENOX) injection  40 mg Subcutaneous Q24H   feeding supplement  237 mL Oral BID BM   gabapentin  100 mg Oral TID   influenza vaccine adjuvanted  0.5 mL Intramuscular Tomorrow-1000   levETIRAcetam  500 mg Oral Q12H   levothyroxine  25 mcg Oral Q0600   multivitamin with minerals  1 tablet Oral Daily   nystatin   Topical BID   potassium chloride  40 mEq Oral Once   Continuous Infusions:     LOS: 5 days    Time spent: 25 mins.More than 50% of that time was spent in counseling and/or coordination of care.      Burnadette Pop, MD [facility] P9/26/2022, 9:52 AM

## 2021-01-24 NOTE — Progress Notes (Signed)
PT HYDROTHERAPY TREATMENT    01/24/21 1000  Subjective Assessment  Subjective "i already did this"  Evaluation and Treatment  Evaluation and Treatment Procedures Explained to Patient/Family Yes  Evaluation and Treatment Procedures agreed to (family present-agreeable)  Pressure Injury 01/21/21 Sacrum Right Stage 4 - Full thickness tissue loss with exposed bone, tendon or muscle. PT Hydro - right sacral pressure injury  Date First Assessed/Time First Assessed: 01/21/21 0400   Location: Sacrum  Location Orientation: Right  Staging: Stage 4 - Full thickness tissue loss with exposed bone, tendon or muscle.  Wound Description (Comments): PT Hydro - right sacral pressure ...  Dressing Type ABD;Normal saline moist dressing;Santyl;Barrier Film (skin prep)  Dressing Changed  Dressing Change Frequency Twice a day  State of Healing Eschar  % Wound base Red or Granulating 30%  % Wound base Yellow/Fibrinous Exudate 20%  % Wound base Black/Eschar 50%  Peri-wound Assessment Maceration;Excoriated (island of necrotic tissue 3:00)  Undermining (cm)  (3:00-4:00; 9:00-12:00)  Margins Unattached edges (unapproximated)  Drainage Amount Moderate  Drainage Description Serosanguineous;Odor  Treatment Debridement (Selective);Hydrotherapy (Pulse lavage);Packing (Saline gauze) (enzymatic debridement)  Hydrotherapy  Pulsed Lavage with Suction (psi) 8 psi  Pulsed Lavage with Suction - Normal Saline Used 1000 mL  Pulsed Lavage Tip Tip with splash shield  Pulsed lavage therapy - wound location right sacral/buttocks pressure injury  Selective Debridement  Selective Debridement - Location right sacral/buttocks pressure injury  Selective Debridement - Tools Used Forceps;Scissors  Selective Debridement - Tissue Removed black necrotic tissue, yellow/tan slough  Wound Therapy - Assess/Plan/Recommendations  Wound Therapy - Clinical Statement Pt had right buttocks pressure injury surgically debrided 01/20/21.  Pt  continues to have necrotic tissue in wound-continued pulsed lavage on today. Pt likely to d/c home with family on Monday (awaiting DME delivery). Will continue to assist with wound management.  Wound Therapy - Functional Problem List dementia, bedbound, poor nutrition status  Factors Delaying/Impairing Wound Healing Immobility;Diabetes Mellitus;Incontinence  Hydrotherapy Plan Debridement;Dressing change;Patient/family education;Pulsatile lavage with suction  Wound Therapy - Frequency 6X / week  Wound Therapy - Current Recommendations Case manager/social work  Wound Therapy - Follow Up Recommendations dressing changes by RN;dressing changes by family/patient  Wound Therapy Goals - Improve the function of patient's integumentary system by progressing the wound(s) through the phases of wound healing by:  Decrease Necrotic Tissue to 40  Decrease Necrotic Tissue - Progress Progressing toward goal  Increase Granulation Tissue to 60  Increase Granulation Tissue - Progress Progressing toward goal  Improve Drainage Characteristics Min  Improve Drainage Characteristics - Progress Progressing toward goal  Goals/treatment plan/discharge plan were made with and agreed upon by patient/family No, Patient unable to participate in goals/treatment/discharge plan and family unavailable  Time For Goal Achievement 2 weeks  Wound Therapy - Potential for Goals Thayer Headings, PT Acute Rehabilitation  Office: [phone] Pager: [phone]

## 2021-01-24 NOTE — TOC Progression Note (Signed)
Transition of Care [facility]) - Progression Note    Patient Details  Name: Carla Manning MRN: [id number] Date of Birth: 1934/08/15  [facility] CM/SW Contact  Ida Rogue, [place] Phone Number: 01/24/2021, 2:53 PM  Clinical Narrative:   Despite putting in the order for the bed on Friday, [facility] still cannot guarantee delivery of bed today.  Daughter still has not gotten a call. Daughter also let me know she does not have a PCP as the lead she was chasing fell apart. I gave her some other possible resources, and she was able to make an appointment for a home visit from [facility] for this Thursday. Will call for transportation if bed is delivered during my shift today.  If not, will call in AM. TOC will continue to follow during the course of hospitalization.     Expected Discharge Plan: Home w Home Health Services Barriers to Discharge: Equipment Delay  Expected Discharge Plan and Services Expected Discharge Plan: Home w Home Health Services   Discharge Planning Services: CM Consult Post Acute Care Choice: Home Health Living arrangements for the past 2 months: Single Family Home Expected Discharge Date: 01/21/21                                     Social Determinants of Health (SDOH) Interventions    Readmission Risk Interventions No flowsheet data found.

## 2021-01-24 NOTE — Care Management Important Message (Signed)
Important Message  Patient Details IM Letter given to the Patient. Name: Carla Manning MRN: [id number] Date of Birth: Jun 02, 1934   Medicare Important Message Given:  Yes     [name] 01/24/2021, 1:09 PM

## 2021-01-25 NOTE — TOC Transition Note (Addendum)
Transition of Care [facility]) - CM/SW Discharge Note   Patient Details  Name: CHAMIKA CUNANAN MRN: [id number] Date of Birth: [birth year]  Transition of Care [facility]) CM/SW Contact:  Ida Rogue, LCSW Phone Number: 01/25/2021, 9:21 AM   Clinical Narrative:   Patient who is medically stable will return home today via PTAR.  Confirmed delivery of bed with daughter.  PTAR arranged. [facility] notified. No further needs identified. TOC sign off.  Addendum:  Cindie with [facility] called and said they would be unable to service patient as they were having challenges with getting RN out in 48 hours.  Called [place] with [facility] who took the referral, stated they could get her seen in 24 hours.    Final next level of care: Home w Home Health Services Barriers to Discharge: Barriers Resolved   Patient Goals and CMS Choice     Choice offered to / list presented to : Adult Children  Discharge Placement                       Discharge Plan and Services   Discharge Planning Services: CM Consult Post Acute Care Choice: Home Health                               Social Determinants of Health (SDOH) Interventions     Readmission Risk Interventions No flowsheet data found.

## 2021-01-25 NOTE — Progress Notes (Addendum)
Patient seen and examined at the bedside this morning.  Remains comfortable, hemodynamically stable.  She is medically stable for discharge .  Discharge summary and orders were already in place yesterday, no new change to medical management. 

## 2021-01-25 NOTE — Progress Notes (Signed)
Several attempts since the start of my shift to get pt to drink something or eat something. I was able to get 1/2 spoonful of pudding in her then she pushes my hand away turning her head away. Mouth is very dry

## 2021-01-28 ENCOUNTER — Telehealth: Payer: Self-pay

## 2021-01-28 NOTE — Telephone Encounter (Signed)
Palliative consult scheduled for Dr Renato Gails on 02/08/21 at 8:30

## 2021-01-31 ENCOUNTER — Telehealth: Payer: Self-pay

## 2021-01-31 NOTE — Telephone Encounter (Signed)
Phone call placed to patient's daughter, [name]. [name] shared that patient is sleeping throughout most of the day and has shown little desire to eat or take medications. Patient has a wound on her sacrum that was changed today by home health nurse. Patient does demonstrate pain with wound care and was given her hydrocodone. Per daughter, this does seem effective. Palliative care provider scheduled to see patient next week. RN/SW team able to see patient this week. Daughter in agreement. Visit scheduled for Wednesday 10/5 @ 12:30pm.

## 2021-02-02 ENCOUNTER — Other Ambulatory Visit: Payer: Medicare Other

## 2021-02-02 ENCOUNTER — Other Ambulatory Visit: Payer: Medicare Other | Admitting: Hospice

## 2021-02-02 ENCOUNTER — Other Ambulatory Visit: Payer: Medicare Other | Admitting: *Deleted

## 2021-02-02 ENCOUNTER — Other Ambulatory Visit: Payer: Self-pay

## 2021-02-02 DIAGNOSIS — Z515 Encounter for palliative care: Secondary | ICD-10-CM

## 2021-02-02 NOTE — Progress Notes (Signed)
COMMUNITY PALLIATIVE CARE SW NOTE  PATIENT NAME: Carla Manning DOB: 1935-03-08 MRN: [id number]  PRIMARY CARE PROVIDER: Pcp, No  RESPONSIBLE PARTY:  Acct ID - Guarantor Home Phone Work Phone Relationship Acct Type  [id number] Carla Manning* [phone]  Self P/F     [address], [place], [place] [address]     PLAN OF CARE and INTERVENTIONS:             GOALS OF CARE/ ADVANCE CARE PLANNING:  Goal is for patient to transition to hospice. Patient is a DNR, form completed and left in the home.  SOCIAL/EMOTIONAL/SPIRITUAL ASSESSMENT/ INTERVENTIONS: SW and RN- Carla Manning  completed initial visit with patient at her home. Patient was present in the living room, laying in a hospital bed. She was awake, alert, but confused and stating repeatedly "I'm ready to lay down." Patient made other scattered verbalizations. Her two daughters-Carla Manning (PCG) and Carla Manning were present with her and provided an update on patient's status. Patient has been unable to ambulate for last nine months. In the past few days, patient has refused to eat, drink or take her medications. Patient took a few pill this morning, but has otherwise refused medications. Patient is incontinent of bowel and bladder and is dependent for all personal care needs. Patient has a wound on her right buttocks and a few tears on her bottom. Patient is sleeping most of the day and when she is awake she is calling out for her daughter. Patient has generalized pain all over and yells out when she is touched. She does take Hydrocodone for patient which has been effective. Patient has episodes where she asks for foods, and may take a bite which she pockets or spits out. Her daughters report that patient has lost a great deal of weight as she was large and round in size. The notice some muscular wasting throughout all of her extremities. Carla Manning shared that patient's son died last year and patient has seemed to decline since his death. Education  was  provided to both daughters regarding palliative care and the difference between hospice and palliative care. The team discussed with daughters that patient appears to be more appropriate for hospice at this time. Carla Manning was tearful, but agreed that patient appears to be at end of life as patient as told them she is "ready to lay down". The referral process was explained to the daughters; RN to discuss  patient's status/case with palliative care physician and will follow-up with PCG. SOCIAL HISTORY: Patient was born and raised in [place], [place]. She is widowed. Patient has 12 children. 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 Goals of care were discussed as PCG desires for patient not to pass away in her home. PCG has a fear that she will come downstairs and find patient deceased. If patient is admitted to hospice she desires an [facility] placement for end-of-life. NP-Carla Manning joined the visit telephonically to discuss code status. PCG desired DNR for patient; form was completed a left in the home. SW provided supportive presence, active listening, observation, education, reassurance of support.  PATIENT/CAREGIVER EDUCATION/ COPING:  Patient appears to coping adequately. She has cognitive deficits. Patient's daughter-Carla Manning is PCG, however, her siblings are supportive of her and patient's care.  PERSONAL EMERGENCY PLAN:  911 can be accessed for emergencies. COMMUNITY RESOURCES COORDINATION/ HEALTH CARE NAVIGATION:  Patient receives RN/SW visits through [facility] who also serve as her PCP. Aide services through [facility] is scheduled to begin next week at 2x's / week. FINANCIAL/LEGAL CONCERNS/INTERVENTIONS:  None.     SOCIAL HX:  Social History   Tobacco Use   Smoking status: Never   Smokeless tobacco: Never  Substance Use Topics   Alcohol use: No    Alcohol/week: 0.0 standard drinks    CODE STATUS: DNR, completed a left in the  home ADVANCED DIRECTIVES: No MOST FORM COMPLETE: No  HOSPICE EDUCATION PROVIDED: No  PPS: Patient is confused. She is refusing food and medications. Patient is total care.   Duration of visit and documentation: 60 minutes.   [address] [place], [place]

## 2021-02-02 NOTE — Progress Notes (Signed)
    Therapist, nutritional Palliative Care Consult Note Telephone: [phone]  Fax: [phone]  PATIENT NAME: [name] DOB: [birth year] MRN: [id number]  PRIMARY CARE PROVIDER:   Pcp, No No referring provider defined for this encounter.  REFERRING PROVIDER: Pcp, No No referring provider defined for this encounter.  RESPONSIBLE PARTY:   Contact Information     Name Relation Home Work Mobile   [place] Daughter [phone]  [phone]   Rolene Course   [phone]       TELEHEALTH VISIT STATEMENT Due to the COVID-19 crisis, this visit was done via telephone from my office. It was initiated and consented to by this patient and/or family.   RECOMMENDATIONS/PLAN:      Advance care planning discussion at the request of [name] SW   Advance Care Planning: Our advance care planning conversation included a discussion about:    The value and importance of advance care planning  Exploration of goals of care in the event of a sudden injury or illness  Identification and preparation of a healthcare agent  Review and updating or creation of an  advance directive document   CODE STATUS: Ramifications and implications of CODE STATUS discussed.  Patient elected to be a DO NOT RESUSCITATE.   Maxine Glenn will give signed DNR to patient and upload same in Epic. GOALS OF CARE: Goals of care include to maximize quality of life and symptom management.      I spent 16 minutes providing this palliative consultation; time includes time spent with patient/family, chart review, provider coordination,  and documentation. More than 50% of the time in this consultation was spent on coordinating communication   CHIEF COMPLAIN: Telehealth visit for goals of care and advance care planning.   Thank you for the opportunity to participate in the care of Carla Manning Please call our office at [phone] if we can be of additional assistance.  Note: Portions of this  note were generated with Scientist, clinical (histocompatibility and immunogenetics). Dictation errors may occur despite best attempts at proofreading.  Rosaura Carpenter, NP

## 2021-02-02 NOTE — Progress Notes (Signed)
[facility] COMMUNITY PALLIATIVE CARE RN NOTE  PATIENT NAME: Carla Manning DOB: 12/25/1934 MRN: [id number]  PRIMARY CARE PROVIDER: Pcp, No  RESPONSIBLE PARTY: Carla Manning (daughter)  Acct ID - Guarantor Home Phone Work Phone Relationship Acct Type  [id number] Carla Manning* [phone]  Self P/F     [address] [place], [place] [address]   Covid-19 Pre-screening Negative  PLAN OF CARE and INTERVENTION:  ADVANCE CARE PLANNING/GOALS OF CARE: Goal is for patient to be as comfortable as possible. Goals of care discussed. Daughter requested a DNR. Carla Manning, palliative care NP contacted who verified daughter's request. 2 Signed copies left in the home. PATIENT/CAREGIVER EDUCATION: Hospice educations, symptom management DISEASE STATUS: Joint follow-up visit made with LCSW, Carla Manning. Patient lives in the home with her daughter, Carla Manning for the past year. Daughter's Carla Manning and Carla Manning present during visit. Upon arrival, she is lying in bed awake and alert. She is confused and has a diagnosis of Dementia. Most of speech is unintelligible and nonsensical. She is able to say some clear words. She continually asked if she could lie down which daughter's believes to mean that she is ready to die. Daughter is concerned as patient has refused to eat for the past 3 days. Prior to this, she started to pocket food. She is also starting to refuse her medications. Carla Manning was unable to get her to take her medications last night, but did manage to get her to take routine medications this morning. She is completely bed bound and total care with all ADLs. She was ambulatory 9 months ago using a walker. She has seemed to decline rapidly since her son died a year ago, per daughter. She is usually sleeping throughout most of the day, however today seems more alert and looking around the room. She can become resistant to care at times. She is incontinent of both bowel and bladder and wears adult briefs. She has a  wound to her right buttocks and several skin tears on her left buttocks. She does experience generalized pain when touched, repositioned and with dressing changes. She is taking Hydrocodone twice daily. She is currently receiving OT/SW/RN through [facility]. She also has a nurse coming weekly from [facility] and this practice serves as her primary physician. Daughter is unsure of the physician's name. Hospice education provided as I feel patient will meet hospice eligibility requirements. Also advised that patient would no longer be able to receive therapies and daughter is agreeable to stopping this. I discussed this case with Dr. Bufford Spikes who feels this patient is hospice appropriate based on my assessment. Our clinical nurse navigator has reached out to [facility] to request verbal hospice order. She is awaiting a return communication/call. Daughter also wants patient to transfer to [facility] for end of life if possible.   HISTORY OF PRESENT ILLNESS:  This is a 86 yo female with a diagnosis of dementia. She has a history of CVA, partial seizure, essential hypertension, hyperlipidemia GERD, hypothyroidism and sacral wound. Palliative care team has been asked to follow patient for additional support, goals of care and complex decision making, however during assessment she is found to be hospice eligible. Awaiting verbal order from PCP.   CODE STATUS: DNR ADVANCED DIRECTIVES: Y MOST FORM: no PPS: 20%   PHYSICAL EXAM:   LUNGS: clear to auscultation  CARDIAC: Cor RRR EXTREMITIES: Trace bilateral lower extremity edema, R>L SKIN:  Wound to right buttocks and skin tears to left buttocks; dressing is dry and  intact    NEURO:  Confused, increased generalized weakness, total care   (Duration of visit and documentation 60 minutes)   Candiss Norse, RN BSN

## 2021-03-01 DEATH — deceased

## 2021-03-11 ENCOUNTER — Encounter (HOSPITAL_BASED_OUTPATIENT_CLINIC_OR_DEPARTMENT_OTHER): Payer: PRIVATE HEALTH INSURANCE | Admitting: Internal Medicine

## 2023-03-19 IMAGING — DX DG CHEST 1V PORT
1 series · 1 of 1 positions shown · non-contrast
Comparison: 05/03/2020

CLINICAL DATA: Chills and fatigue.  Rule out occult pneumonia

EXAM:
PORTABLE CHEST 1 VIEW

[chest ap]
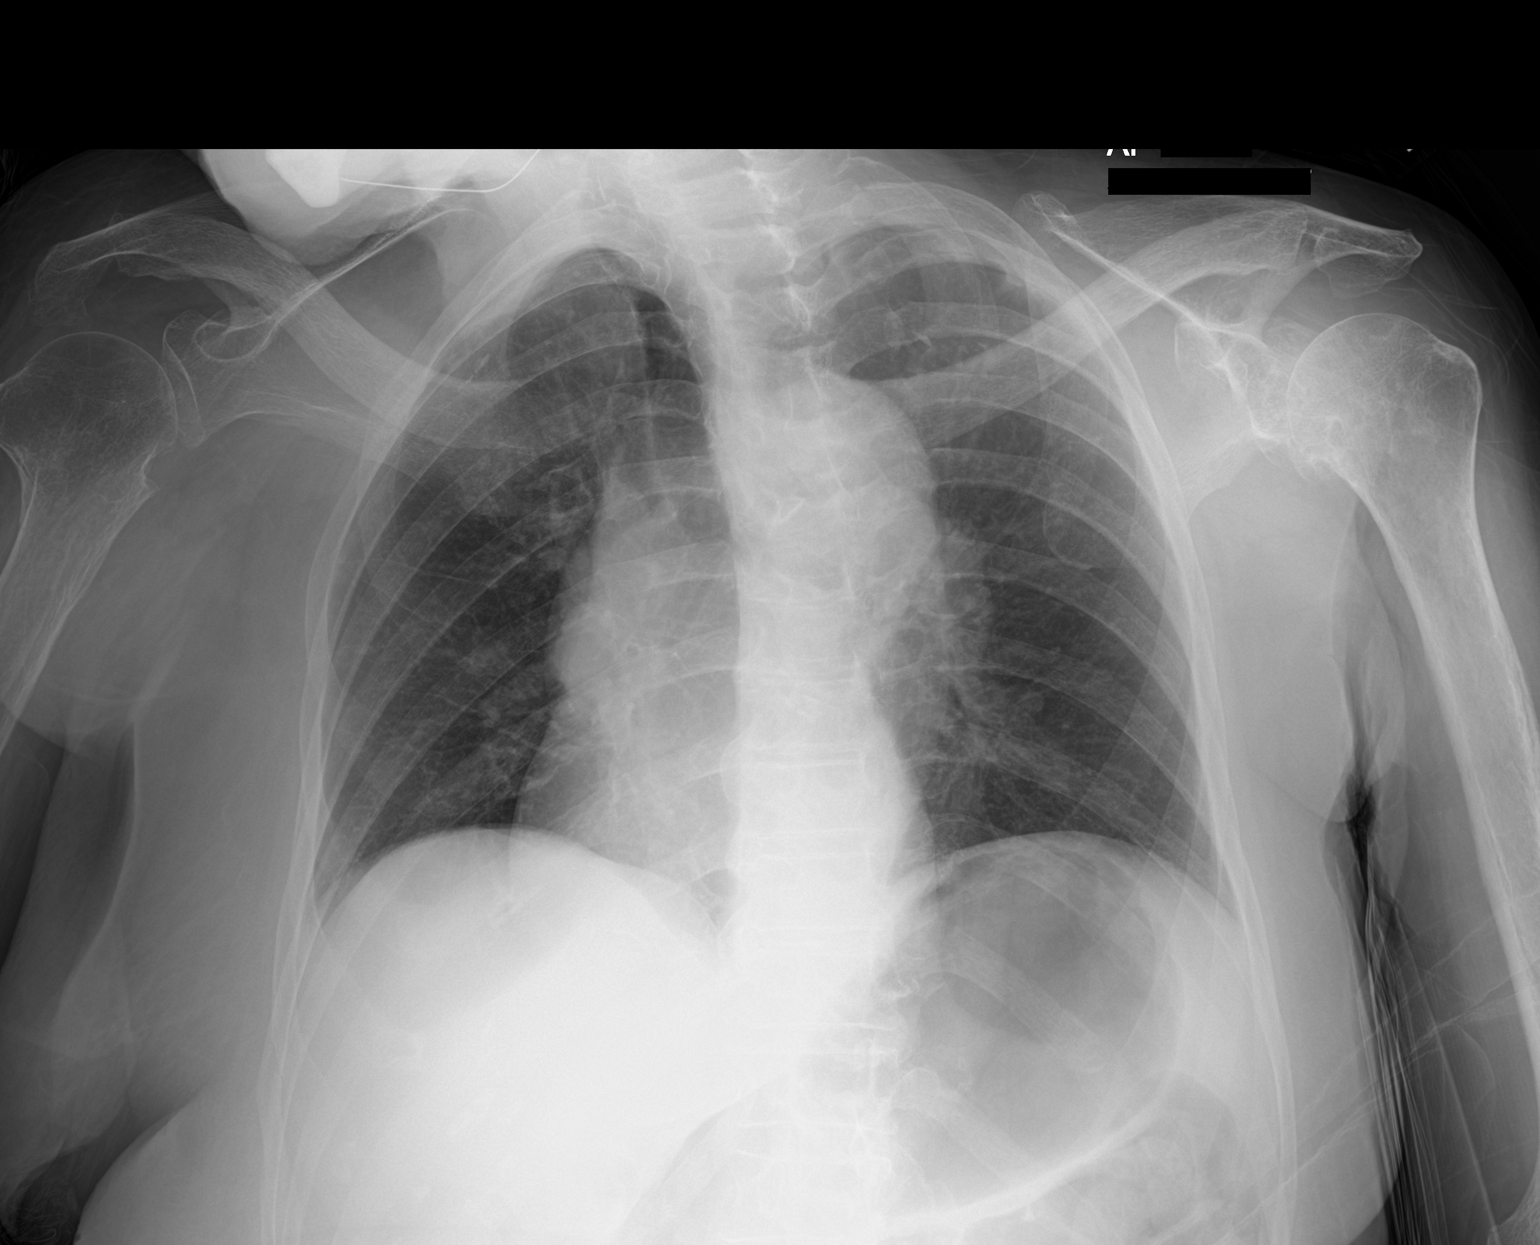

[1 of 1 positions shown; findings below may reference images not displayed]

FINDINGS: Shallow inspiration. Heart size and pulmonary vascularity are
normal. Patient rotation limits evaluation but there is no obvious
consolidation or airspace disease in the lungs. No pleural
effusions. No pneumothorax. Mediastinal contours appear intact.
Degenerative changes in the spine and shoulders.
IMPRESSION: Shallow inspiration.  No evidence of active pulmonary disease.

## 2023-03-19 IMAGING — CT CT ABD-PELV W/ CM
2 of 8 series · 15 of 46 positions shown, 17 images · IV contrast (OMNIPAQUE 350)
Comparison: None.

CLINICAL DATA: Abdominal abscess or infection suspected. Patient
has a worsening right sacral wound with stool coming from it.

EXAM:
CT ABDOMEN AND PELVIS WITH CONTRAST
TECHNIQUE: Multidetector CT imaging of the abdomen and pelvis was performed
using the standard protocol following bolus administration of
intravenous contrast.
CONTRAST:  75mL OMNIPAQUE IOHEXOL 350 MG/ML SOLN

[Series 2: axial st · axial · 0.73mm/px · z∈[-449,-89]mm · 12 of 86 slices shown, 14 images]
[im 7/86  soft-tissue]
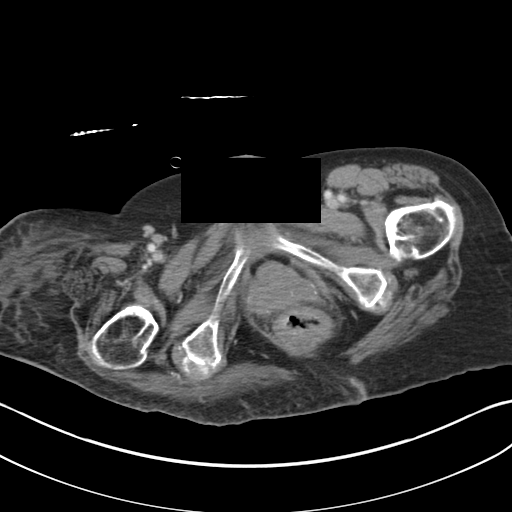
[im 7/86  bone]
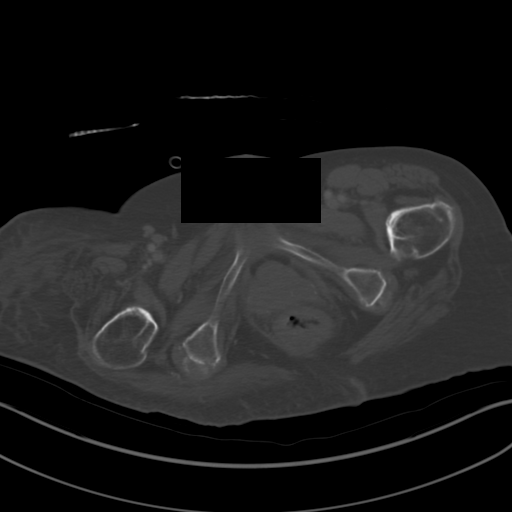
[im 14/86  soft-tissue]
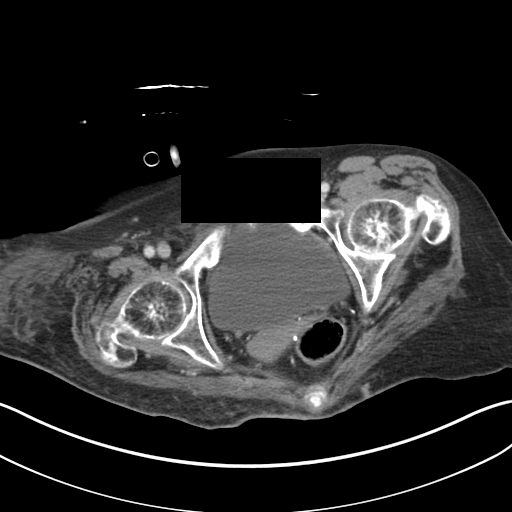
[im 20/86  soft-tissue]
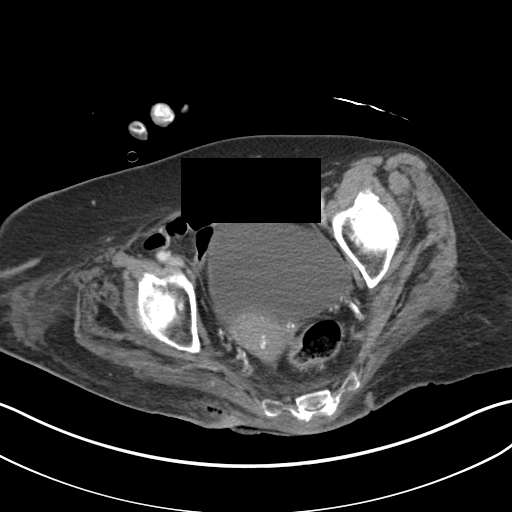
[im 27/86  soft-tissue]
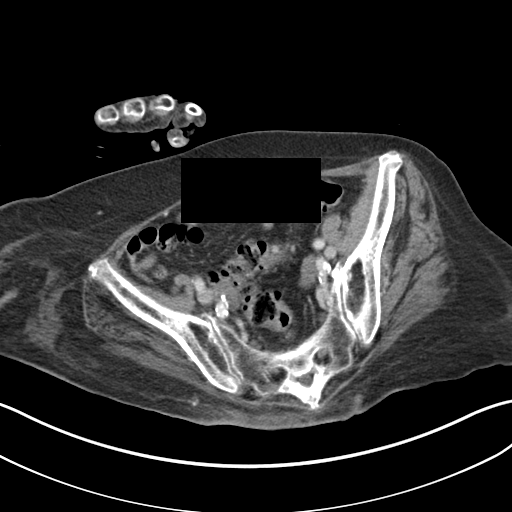
[im 33/86  soft-tissue]
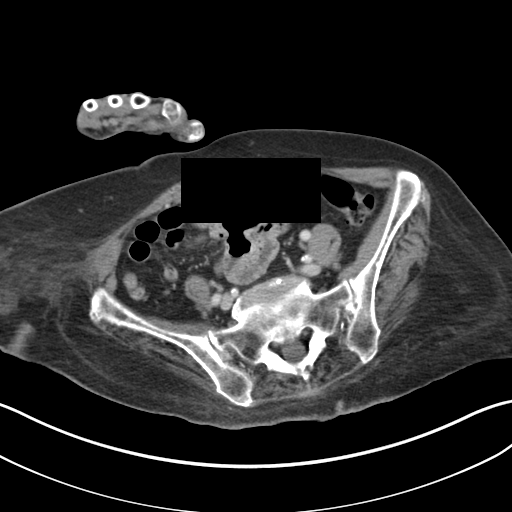
[im 40/86  soft-tissue]
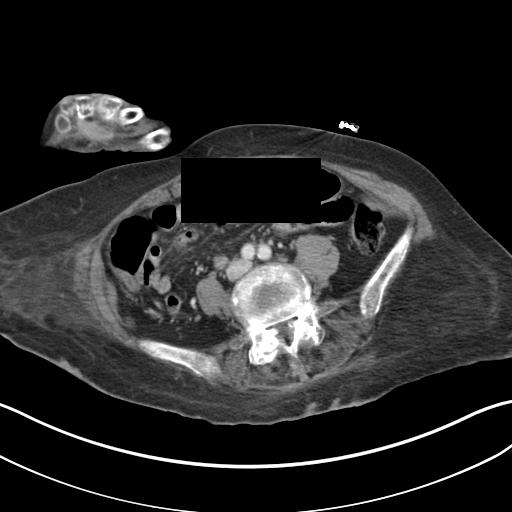
[im 46/86  soft-tissue]
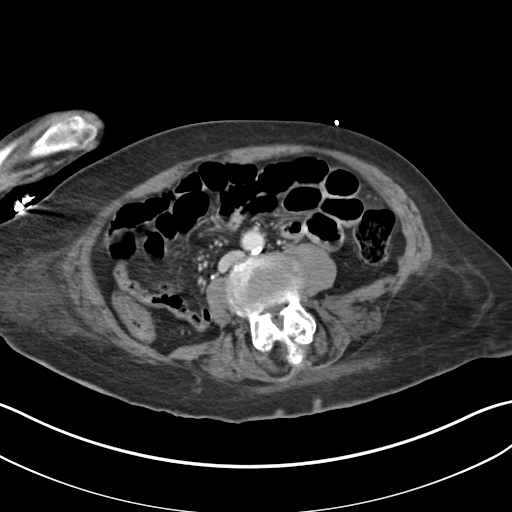
[im 53/86  soft-tissue]
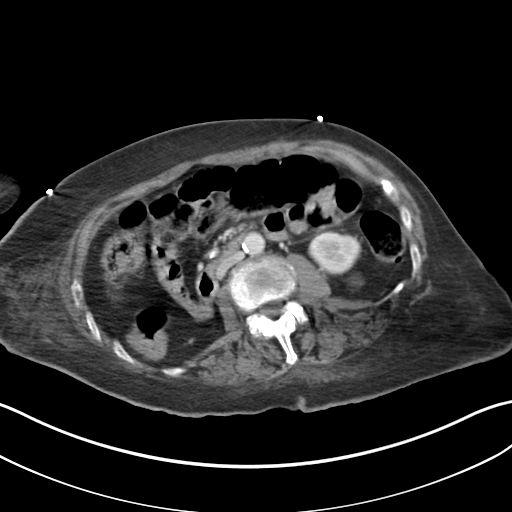
[im 59/86  soft-tissue]
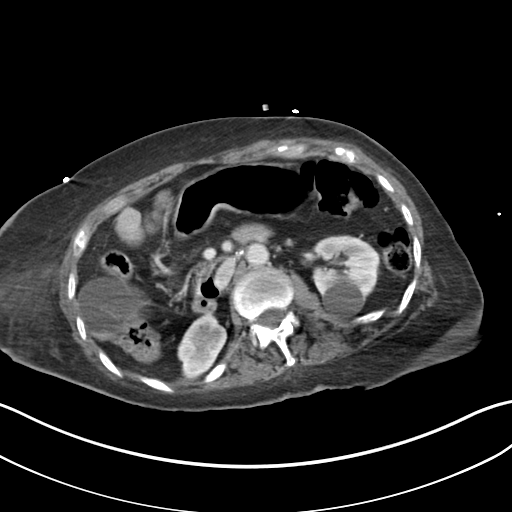
[im 59/86  bone]
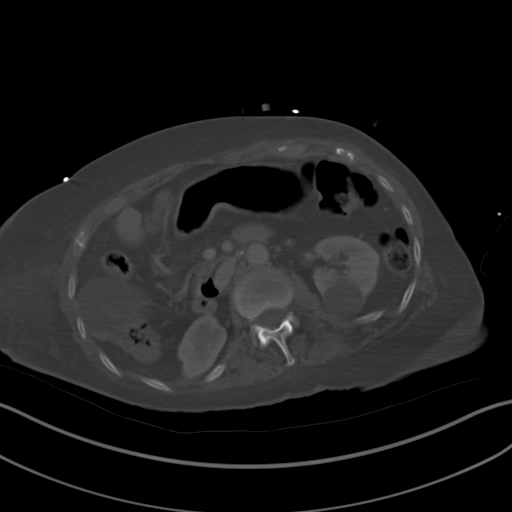
[im 66/86  soft-tissue]
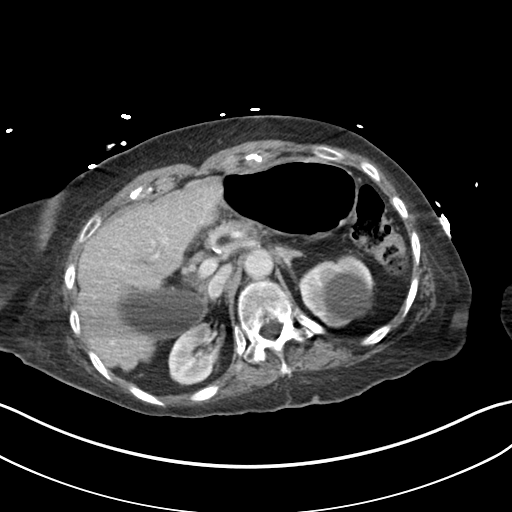
[im 72/86  soft-tissue]
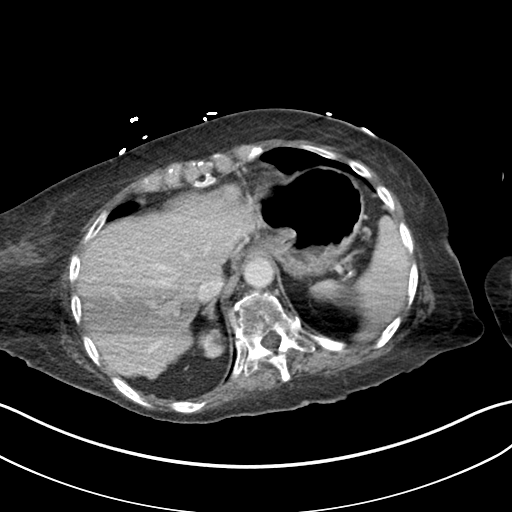
[im 79/86  soft-tissue]
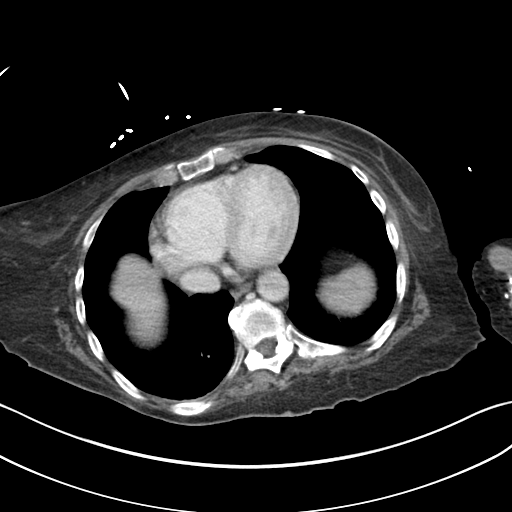

[Series 5: coronal st · coronal · 0.61mm/px · 3 of 114 slices shown]
[im 29/114  soft-tissue]
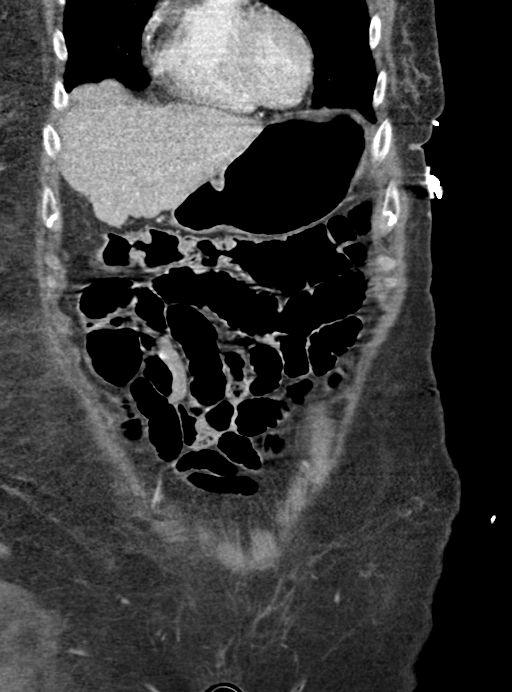
[im 57/114  soft-tissue]
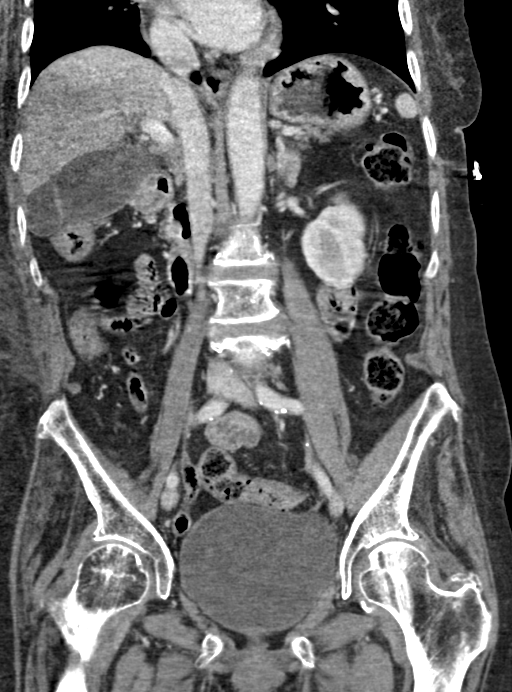
[im 85/114  soft-tissue]
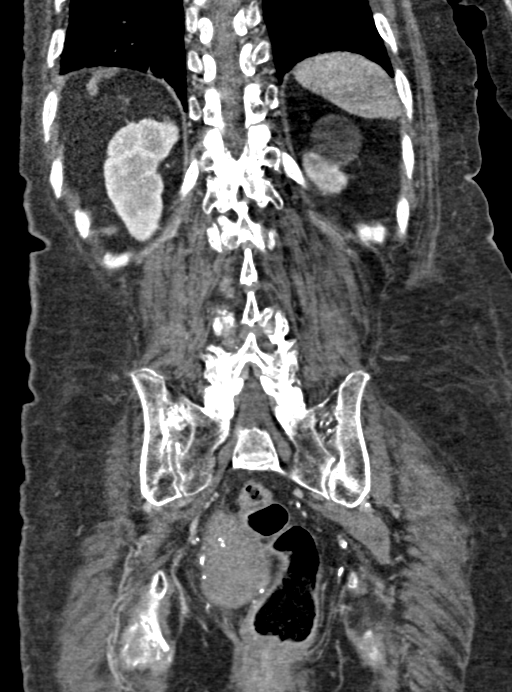

[15 of 46 positions shown; findings below may reference images not displayed]

FINDINGS: Lower chest: Lung bases are clear.  Coronary artery calcifications.

Hepatobiliary: The liver is atrophic with nodular contour possibly
indicating hepatic cirrhosis. No focal lesions are identified.
Gallbladder is distended with layering sludge or stones. No wall
thickening or inflammatory changes. No bile duct dilatation.

Pancreas: Unremarkable. No pancreatic ductal dilatation or
surrounding inflammatory changes.

Spleen: Normal in size without focal abnormality.

Adrenals/Urinary Tract: No adrenal gland nodules. Benign-appearing
left renal cysts. Scarring and parenchymal atrophy of the right
kidney. No hydronephrosis or hydroureter. Bladder is normal.

Stomach/Bowel: The stomach, small bowel, and colon are not
abnormally distended. Scattered stool in the colon. Scattered
colonic diverticula with sigmoid diverticulosis. No wall thickening
or inflammatory changes. No perianal fistula or perianal abscess
demonstrated.

Vascular/Lymphatic: Aortic atherosclerosis. No enlarged abdominal or
pelvic lymph nodes.

Reproductive: Uterus and bilateral adnexa are unremarkable.

Other: No free air or free fluid in the abdomen. Abdominal wall
musculature appears intact. Subcutaneous soft tissue edema lateral
to the right hip. Right posterior parasacral decubitus ulceration
with stranding and gas in the underlying soft tissues. Gas extends
focally into the inferior gluteus muscles. No discrete abscess or
fistula identified. Underlying bones appear intact without evidence
of osteomyelitis.

Musculoskeletal: Degenerative changes in the spine. Slight anterior
subluxation of L4 on L5 is likely degenerative. No destructive bone
lesions.
IMPRESSION: 1. Right parasacral decubitus ulceration with stranding and gas in
the underlying subcutaneous fatty tissues most consistent with
cellulitis. No discrete abscess identified. No CT findings to
suggest fistula or osteomyelitis.
2. Probable hepatic cirrhosis without focal lesion.
3. Distended gallbladder with layering sludge or stones. Possible
dysmotility.
4. Aortic atherosclerosis.
